# Patient Record
Sex: Female | Born: 1950 | Race: Asian | Hispanic: No | Marital: Married | Smoking: Never smoker
Health system: Southern US, Community
[De-identification: ages and names within clinical notes are randomized; demographics above are authoritative.]

## PROBLEM LIST (undated history)

## (undated) DIAGNOSIS — E785 Hyperlipidemia, unspecified: Secondary | ICD-10-CM

## (undated) DIAGNOSIS — E7800 Pure hypercholesterolemia, unspecified: Secondary | ICD-10-CM

## (undated) DIAGNOSIS — D649 Anemia, unspecified: Secondary | ICD-10-CM

## (undated) DIAGNOSIS — N10 Acute tubulo-interstitial nephritis: Secondary | ICD-10-CM

## (undated) DIAGNOSIS — N289 Disorder of kidney and ureter, unspecified: Secondary | ICD-10-CM

## (undated) DIAGNOSIS — N1330 Unspecified hydronephrosis: Secondary | ICD-10-CM

## (undated) DIAGNOSIS — Z8619 Personal history of other infectious and parasitic diseases: Secondary | ICD-10-CM

## (undated) DIAGNOSIS — E119 Type 2 diabetes mellitus without complications: Secondary | ICD-10-CM

## (undated) DIAGNOSIS — I10 Essential (primary) hypertension: Secondary | ICD-10-CM

## (undated) HISTORY — PX: NO PAST SURGERIES: SHX2092

---

## 2006-06-16 ENCOUNTER — Emergency Department (HOSPITAL_COMMUNITY): Admission: EM | Admit: 2006-06-16 | Discharge: 2006-06-16 | Payer: Self-pay | Admitting: Family Medicine

## 2015-12-15 DIAGNOSIS — E781 Pure hyperglyceridemia: Secondary | ICD-10-CM | POA: Diagnosis not present

## 2015-12-15 DIAGNOSIS — E1165 Type 2 diabetes mellitus with hyperglycemia: Secondary | ICD-10-CM | POA: Diagnosis not present

## 2016-05-03 DIAGNOSIS — E1165 Type 2 diabetes mellitus with hyperglycemia: Secondary | ICD-10-CM | POA: Diagnosis not present

## 2016-05-03 DIAGNOSIS — E781 Pure hyperglyceridemia: Secondary | ICD-10-CM | POA: Diagnosis not present

## 2016-05-19 DIAGNOSIS — E1165 Type 2 diabetes mellitus with hyperglycemia: Secondary | ICD-10-CM | POA: Diagnosis not present

## 2016-05-19 DIAGNOSIS — Z23 Encounter for immunization: Secondary | ICD-10-CM | POA: Diagnosis not present

## 2016-07-27 ENCOUNTER — Encounter (HOSPITAL_COMMUNITY): Payer: Self-pay | Admitting: Emergency Medicine

## 2016-07-27 ENCOUNTER — Ambulatory Visit (HOSPITAL_COMMUNITY)
Admission: EM | Admit: 2016-07-27 | Discharge: 2016-07-27 | Disposition: A | Discharge status: Home / Self Care | Payer: BLUE CROSS/BLUE SHIELD | Attending: Family Medicine | Admitting: Family Medicine

## 2016-07-27 ENCOUNTER — Ambulatory Visit (INDEPENDENT_AMBULATORY_CARE_PROVIDER_SITE_OTHER): Payer: BLUE CROSS/BLUE SHIELD

## 2016-07-27 DIAGNOSIS — R509 Fever, unspecified: Secondary | ICD-10-CM | POA: Diagnosis not present

## 2016-07-27 DIAGNOSIS — J111 Influenza due to unidentified influenza virus with other respiratory manifestations: Secondary | ICD-10-CM

## 2016-07-27 DIAGNOSIS — R69 Illness, unspecified: Secondary | ICD-10-CM

## 2016-07-27 MED ORDER — ONDANSETRON HCL 4 MG PO TABS: 4.0000 mg | ORAL_TABLET | Freq: Four times a day (QID) | ORAL | 0 refills | Status: DC

## 2016-07-27 NOTE — ED Triage Notes (Signed)
[facility] with her family with a complaint of a fever. The patient reported that she has had a fever x 3 days with general body aches and vomiting. The patient reported general body aches but denied abdominal pain. The patient denied any antipyretics today.

## 2016-07-27 NOTE — ED Provider Notes (Signed)
[facility]    CSN: [id number] Arrival date & time: 07/27/16  1327     History   Chief Complaint Chief Complaint  Patient presents with  . Fever    HPI Rachel Nelson is a 65 y.o. female.   The history is provided by the patient and a relative. The history is limited by a language barrier. Language interpreter used: son translated.  Fever  Temp source:  Tactile Severity:  Mild Onset quality:  Sudden Duration:  3 days Timing:  Constant Progression:  Unchanged Chronicity:  New (had flu vaccine 2mos ago.) Associated symptoms: vomiting   Associated symptoms: no diarrhea     Past Medical History:  Diagnosis Date  . Diabetes mellitus without complication (HCC)     There are no active problems to display for this patient.   History reviewed. No pertinent surgical history.  OB History    No data available       Home Medications    Prior to Admission medications   Medication Sig Start Date End Date Taking? Authorizing Provider  metFORMIN (GLUCOPHAGE) 500 MG tablet Take 500 mg by mouth 2 (two) times daily with a meal.   Yes Historical Provider, MD    Family History History reviewed. No pertinent family history.  Social History Social History  Substance Use Topics  . Smoking status: Never Smoker  . Smokeless tobacco: Never Used  . Alcohol use No     Allergies   Patient has no known allergies.   Review of Systems Review of Systems  Constitutional: Positive for fever.  HENT: Negative.   Respiratory: Negative.   Cardiovascular: Negative.   Gastrointestinal: Positive for vomiting. Negative for blood in stool and diarrhea.  Genitourinary: Negative.   Neurological: Negative.   All other systems reviewed and are negative.    Physical Exam Triage Vital Signs ED Triage Vitals  Enc Vitals Group     BP 07/27/16 1424 (!) 151/51     Pulse Rate 07/27/16 1424 105     Resp 07/27/16 1424 16     Temp 07/27/16 1424 100.1 F (37.8 C)     Temp  Source 07/27/16 1424 Oral     SpO2 07/27/16 1424 97 %     Weight --      Height --      Head Circumference --      Peak Flow --      Pain Score 07/27/16 1430 7     Pain Loc --      Pain Edu? --      Excl. in GC? --    No data found.   Updated Vital Signs BP (!) 151/51 (BP Location: Left Arm)   Pulse 105   Temp 100.1 F (37.8 C) (Oral)   Resp 16   SpO2 97%   Visual Acuity Right Eye Distance:   Left Eye Distance:   Bilateral Distance:    Right Eye Near:   Left Eye Near:    Bilateral Near:     Physical Exam  Constitutional: She is oriented to person, place, and time. She appears well-developed and well-nourished. No distress.  HENT:  Right Ear: External ear normal.  Left Ear: External ear normal.  Mouth/Throat: Oropharynx is clear and moist.  Eyes: Conjunctivae and EOM are normal. Pupils are equal, round, and reactive to light.  Neck: Normal range of motion. Neck supple.  Cardiovascular: Normal rate, regular rhythm, normal heart sounds and intact distal pulses.   Pulmonary/Chest: Effort normal and  breath sounds normal. She has no rales.  Abdominal: Soft. Bowel sounds are normal.  Lymphadenopathy:    She has no cervical adenopathy.  Neurological: She is alert and oriented to person, place, and time.  Skin: Skin is warm and dry.  Nursing note and vitals reviewed.    UC Treatments / Results  Labs (all labs ordered are listed, but only abnormal results are displayed) Labs Reviewed - No data to display  EKG  EKG Interpretation None       Radiology No results found. X-rays reviewed and report per radiologist.  Procedures Procedures (including critical care time)  Medications Ordered in UC Medications - No data to display   Initial Impression / Assessment and Plan / UC Course  I have reviewed the triage vital signs and the nursing notes.  Pertinent labs & imaging results that were available during my care of the patient were reviewed by me and  considered in my medical decision making (see chart for details).  Clinical Course       Final Clinical Impressions(s) / UC Diagnoses   Final diagnoses:  None    New Prescriptions New Prescriptions   No medications on file     Linna HoffJames D Kindl, MD 07/27/16 1626

## 2016-08-10 DIAGNOSIS — S46001A Unspecified injury of muscle(s) and tendon(s) of the rotator cuff of right shoulder, initial encounter: Secondary | ICD-10-CM | POA: Diagnosis not present

## 2017-03-13 DIAGNOSIS — Z87448 Personal history of other diseases of urinary system: Secondary | ICD-10-CM

## 2017-03-13 DIAGNOSIS — Z87440 Personal history of urinary (tract) infections: Secondary | ICD-10-CM

## 2017-03-13 HISTORY — DX: Personal history of urinary (tract) infections: Z87.440

## 2017-03-13 HISTORY — DX: Personal history of other diseases of urinary system: Z87.448

## 2017-03-14 DIAGNOSIS — R5381 Other malaise: Secondary | ICD-10-CM | POA: Diagnosis not present

## 2017-03-16 DIAGNOSIS — R739 Hyperglycemia, unspecified: Secondary | ICD-10-CM | POA: Diagnosis not present

## 2017-03-16 DIAGNOSIS — M5489 Other dorsalgia: Secondary | ICD-10-CM | POA: Diagnosis not present

## 2017-03-21 ENCOUNTER — Encounter (HOSPITAL_COMMUNITY): Payer: Self-pay | Admitting: Emergency Medicine

## 2017-03-21 ENCOUNTER — Emergency Department (HOSPITAL_COMMUNITY): Payer: BLUE CROSS/BLUE SHIELD

## 2017-03-21 ENCOUNTER — Inpatient Hospital Stay (HOSPITAL_COMMUNITY)
Admission: EM | Admit: 2017-03-21 | Discharge: 2017-03-24 | DRG: 690 | Disposition: A | Discharge status: Home / Self Care | Payer: BLUE CROSS/BLUE SHIELD | Attending: Family Medicine | Admitting: Family Medicine

## 2017-03-21 DIAGNOSIS — R109 Unspecified abdominal pain: Secondary | ICD-10-CM | POA: Diagnosis not present

## 2017-03-21 DIAGNOSIS — R636 Underweight: Secondary | ICD-10-CM | POA: Diagnosis not present

## 2017-03-21 DIAGNOSIS — Z7984 Long term (current) use of oral hypoglycemic drugs: Secondary | ICD-10-CM

## 2017-03-21 DIAGNOSIS — Z1612 Extended spectrum beta lactamase (ESBL) resistance: Secondary | ICD-10-CM | POA: Diagnosis not present

## 2017-03-21 DIAGNOSIS — E119 Type 2 diabetes mellitus without complications: Secondary | ICD-10-CM

## 2017-03-21 DIAGNOSIS — N12 Tubulo-interstitial nephritis, not specified as acute or chronic: Secondary | ICD-10-CM

## 2017-03-21 DIAGNOSIS — E785 Hyperlipidemia, unspecified: Secondary | ICD-10-CM | POA: Diagnosis not present

## 2017-03-21 DIAGNOSIS — E871 Hypo-osmolality and hyponatremia: Secondary | ICD-10-CM | POA: Diagnosis not present

## 2017-03-21 DIAGNOSIS — N10 Acute tubulo-interstitial nephritis: Secondary | ICD-10-CM | POA: Diagnosis not present

## 2017-03-21 DIAGNOSIS — D638 Anemia in other chronic diseases classified elsewhere: Secondary | ICD-10-CM | POA: Diagnosis not present

## 2017-03-21 DIAGNOSIS — N289 Disorder of kidney and ureter, unspecified: Secondary | ICD-10-CM | POA: Diagnosis not present

## 2017-03-21 DIAGNOSIS — E1165 Type 2 diabetes mellitus with hyperglycemia: Secondary | ICD-10-CM | POA: Diagnosis not present

## 2017-03-21 DIAGNOSIS — E861 Hypovolemia: Secondary | ICD-10-CM | POA: Diagnosis present

## 2017-03-21 DIAGNOSIS — D649 Anemia, unspecified: Secondary | ICD-10-CM | POA: Diagnosis not present

## 2017-03-21 DIAGNOSIS — Z681 Body mass index (BMI) 19 or less, adult: Secondary | ICD-10-CM

## 2017-03-21 DIAGNOSIS — B9620 Unspecified Escherichia coli [E. coli] as the cause of diseases classified elsewhere: Secondary | ICD-10-CM | POA: Diagnosis not present

## 2017-03-21 DIAGNOSIS — N132 Hydronephrosis with renal and ureteral calculous obstruction: Secondary | ICD-10-CM | POA: Diagnosis not present

## 2017-03-21 DIAGNOSIS — R1032 Left lower quadrant pain: Secondary | ICD-10-CM | POA: Diagnosis not present

## 2017-03-21 HISTORY — DX: Acute pyelonephritis: N10

## 2017-03-21 HISTORY — DX: Pure hypercholesterolemia, unspecified: E78.00

## 2017-03-21 HISTORY — DX: Disorder of kidney and ureter, unspecified: N28.9

## 2017-03-21 HISTORY — DX: Type 2 diabetes mellitus without complications: E11.9

## 2017-03-21 LAB — DIFFERENTIAL
BASOS ABS: 0 10*3/uL (ref 0.0–0.1)
BASOS PCT: 0 %
EOS PCT: 1 %
Eosinophils Absolute: 0.2 10*3/uL (ref 0.0–0.7)
LYMPHS PCT: 8 %
Lymphs Abs: 1.9 10*3/uL (ref 0.7–4.0)
Monocytes Absolute: 1.9 10*3/uL — ABNORMAL HIGH (ref 0.1–1.0)
Monocytes Relative: 8 %
NEUTROS ABS: 18.7 10*3/uL — AB (ref 1.7–7.7)
NEUTROS PCT: 83 %

## 2017-03-21 LAB — LIPASE, BLOOD: Lipase: 38 U/L (ref 11–51)

## 2017-03-21 LAB — URINALYSIS, ROUTINE W REFLEX MICROSCOPIC
Bilirubin Urine: NEGATIVE
GLUCOSE, UA: 150 mg/dL — AB
Ketones, ur: 5 mg/dL — AB
NITRITE: NEGATIVE
PH: 5 (ref 5.0–8.0)
PROTEIN: 30 mg/dL — AB
Specific Gravity, Urine: 1.014 (ref 1.005–1.030)

## 2017-03-21 LAB — CBC
HCT: 27 % — ABNORMAL LOW (ref 36.0–46.0)
Hemoglobin: 9.2 g/dL — ABNORMAL LOW (ref 12.0–15.0)
MCH: 28 pg (ref 26.0–34.0)
MCHC: 34.1 g/dL (ref 30.0–36.0)
MCV: 82.1 fL (ref 78.0–100.0)
Platelets: 386 10*3/uL (ref 150–400)
RBC: 3.29 MIL/uL — ABNORMAL LOW (ref 3.87–5.11)
RDW: 13.7 % (ref 11.5–15.5)
WBC: 23 10*3/uL — ABNORMAL HIGH (ref 4.0–10.5)

## 2017-03-21 LAB — COMPREHENSIVE METABOLIC PANEL
ALBUMIN: 2.1 g/dL — AB (ref 3.5–5.0)
ALK PHOS: 120 U/L (ref 38–126)
ALT: 37 U/L (ref 14–54)
ANION GAP: 9 (ref 5–15)
AST: 27 U/L (ref 15–41)
BILIRUBIN TOTAL: 0.8 mg/dL (ref 0.3–1.2)
BUN: 22 mg/dL — AB (ref 6–20)
CALCIUM: 8.2 mg/dL — AB (ref 8.9–10.3)
CO2: 22 mmol/L (ref 22–32)
Chloride: 94 mmol/L — ABNORMAL LOW (ref 101–111)
Creatinine, Ser: 1.34 mg/dL — ABNORMAL HIGH (ref 0.44–1.00)
GFR calc Af Amer: 47 mL/min — ABNORMAL LOW (ref >60)
GFR calc non Af Amer: 41 mL/min — ABNORMAL LOW (ref >60)
GLUCOSE: 258 mg/dL — AB (ref 65–99)
Potassium: 4.7 mmol/L (ref 3.5–5.1)
Sodium: 125 mmol/L — ABNORMAL LOW (ref 135–145)
TOTAL PROTEIN: 7.5 g/dL (ref 6.5–8.1)

## 2017-03-21 LAB — CBG MONITORING, ED
GLUCOSE-CAPILLARY: 248 mg/dL — AB (ref 65–99)
Glucose-Capillary: 252 mg/dL — ABNORMAL HIGH (ref 65–99)

## 2017-03-21 MED ORDER — ACETAMINOPHEN 325 MG PO TABS
650.0000 mg | ORAL_TABLET | Freq: Four times a day (QID) | ORAL | Status: DC | PRN
  Administered 2017-03-23: 650 mg via ORAL
  Filled 2017-03-21: qty 2

## 2017-03-21 MED ORDER — DEXTROSE 5 % IV SOLN
1.0000 g | INTRAVENOUS | Status: DC
  Administered 2017-03-22: 1 g via INTRAVENOUS
  Filled 2017-03-21 (×2): qty 10

## 2017-03-21 MED ORDER — INSULIN ASPART 100 UNIT/ML ~~LOC~~ SOLN
0.0000 [IU] | Freq: Three times a day (TID) | SUBCUTANEOUS | Status: DC
  Administered 2017-03-22: 3 [IU] via SUBCUTANEOUS
  Administered 2017-03-22: 7 [IU] via SUBCUTANEOUS

## 2017-03-21 MED ORDER — HYDROCODONE-ACETAMINOPHEN 5-325 MG PO TABS
1.0000 | ORAL_TABLET | ORAL | Status: DC | PRN
  Administered 2017-03-22: 2 via ORAL
  Filled 2017-03-21: qty 2

## 2017-03-21 MED ORDER — DEXTROSE 5 % IV SOLN
1.0000 g | Freq: Once | INTRAVENOUS | Status: AC
  Administered 2017-03-21: 1 g via INTRAVENOUS
  Filled 2017-03-21: qty 10

## 2017-03-21 MED ORDER — GEMFIBROZIL 600 MG PO TABS
600.0000 mg | ORAL_TABLET | Freq: Two times a day (BID) | ORAL | Status: DC
  Administered 2017-03-22 – 2017-03-24 (×5): 600 mg via ORAL
  Filled 2017-03-21 (×6): qty 1

## 2017-03-21 MED ORDER — INSULIN ASPART 100 UNIT/ML ~~LOC~~ SOLN
0.0000 [IU] | Freq: Every day | SUBCUTANEOUS | Status: DC
  Administered 2017-03-21: 2 [IU] via SUBCUTANEOUS

## 2017-03-21 MED ORDER — SODIUM CHLORIDE 0.9 % IV BOLUS (SEPSIS)
500.0000 mL | Freq: Once | INTRAVENOUS | Status: AC
  Administered 2017-03-22: 500 mL via INTRAVENOUS

## 2017-03-21 MED ORDER — ONDANSETRON HCL 4 MG PO TABS: 4.0000 mg | ORAL_TABLET | Freq: Four times a day (QID) | ORAL | Status: DC | PRN

## 2017-03-21 MED ORDER — MORPHINE SULFATE (PF) 4 MG/ML IV SOLN
4.0000 mg | Freq: Once | INTRAVENOUS | Status: AC
  Administered 2017-03-21: 4 mg via INTRAVENOUS
  Filled 2017-03-21: qty 1

## 2017-03-21 MED ORDER — ENOXAPARIN SODIUM 40 MG/0.4ML ~~LOC~~ SOLN
40.0000 mg | SUBCUTANEOUS | Status: DC
  Administered 2017-03-22 – 2017-03-24 (×3): 40 mg via SUBCUTANEOUS
  Filled 2017-03-21 (×3): qty 0.4

## 2017-03-21 MED ORDER — MORPHINE SULFATE (PF) 4 MG/ML IV SOLN
2.0000 mg | INTRAVENOUS | Status: DC | PRN
  Administered 2017-03-23: 2 mg via INTRAVENOUS
  Filled 2017-03-21: qty 1

## 2017-03-21 MED ORDER — SENNOSIDES-DOCUSATE SODIUM 8.6-50 MG PO TABS: 1.0000 | ORAL_TABLET | Freq: Every evening | ORAL | Status: DC | PRN

## 2017-03-21 MED ORDER — SODIUM CHLORIDE 0.9 % IV SOLN
INTRAVENOUS | Status: DC
  Administered 2017-03-21 – 2017-03-22 (×2): via INTRAVENOUS

## 2017-03-21 MED ORDER — BISACODYL 5 MG PO TBEC: 5.0000 mg | DELAYED_RELEASE_TABLET | Freq: Every day | ORAL | Status: DC | PRN

## 2017-03-21 MED ORDER — ACETAMINOPHEN 650 MG RE SUPP: 650.0000 mg | Freq: Four times a day (QID) | RECTAL | Status: DC | PRN

## 2017-03-21 MED ORDER — ONDANSETRON HCL 4 MG/2ML IJ SOLN: 4.0000 mg | Freq: Four times a day (QID) | INTRAMUSCULAR | Status: DC | PRN

## 2017-03-21 MED ORDER — SODIUM CHLORIDE 0.9% FLUSH
3.0000 mL | Freq: Two times a day (BID) | INTRAVENOUS | Status: DC
  Administered 2017-03-21 – 2017-03-23 (×3): 3 mL via INTRAVENOUS

## 2017-03-21 NOTE — ED Provider Notes (Signed)
[facility]   CSN: [id number] Arrival date & time: 03/21/17  4098     History   Chief Complaint Chief Complaint  Patient presents with  . Abdominal Pain  . Hypoglycemia    HPI Rachel Nelson is a 66 y.o. female.  HPI Patient presents to the emergency department with weakness, abdominal discomfort, decreased appetite and fluctuating blood sugars.  The patient said several episodes of vomiting as well  The patient states that symptoms started about a week ago.  Patient states nothing seems make the condition better or worse.  The son is translating for the patient.  He states that she has not had any known fevers but has felt chills.  Patient states she has not been eating very well in the last few days.  Son states that she been complaining of some back pain as well. The patient denies chest pain, shortness of breath, headache,blurred vision, neck pain, fever, cough, numbness, dizziness, anorexia, edema,  diarrhea, rash, back pain, dysuria, hematemesis, bloody stool, near syncope, or syncope. Past Medical History:  Diagnosis Date  . Diabetes mellitus without complication (HCC)     There are no active problems to display for this patient.   History reviewed. No pertinent surgical history.  OB History    No data available       Home Medications    Prior to Admission medications   Medication Sig Start Date End Date Taking? Authorizing Provider  metFORMIN (GLUCOPHAGE) 500 MG tablet Take 500 mg by mouth 2 (two) times daily with a meal.    [provider]  ondansetron (ZOFRAN) 4 MG tablet Take 1 tablet (4 mg total) by mouth every 6 (six) hours. Prn n/v. 07/27/16   Linna Hoff, MD    Family History No family history on file.  Social History Social History  Substance Use Topics  . Smoking status: Never Smoker  . Smokeless tobacco: Never Used  . Alcohol use No     Allergies   Patient has no known allergies.   Review of Systems Review  of Systems All other systems negative except as documented in the HPI. All pertinent positives and negatives as reviewed in the HPI.  Physical Exam Updated Vital Signs BP (!) 121/59   Pulse 79   Temp 98.7 F (37.1 C) (Oral)   Resp 18   Ht 5\' 5"  (1.651 m)   Wt 54 kg (119 lb 0.8 oz)   SpO2 98%   BMI 19.81 kg/m   Physical Exam  Constitutional: She is oriented to person, place, and time. She appears well-developed and well-nourished. No distress.  HENT:  Head: Normocephalic and atraumatic.  Mouth/Throat: Oropharynx is clear and moist.  Eyes: Pupils are equal, round, and reactive to light.  Neck: Normal range of motion. Neck supple.  Cardiovascular: Normal rate, regular rhythm and normal heart sounds.  Exam reveals no gallop and no friction rub.   No murmur heard. Pulmonary/Chest: Effort normal and breath sounds normal. No respiratory distress. She has no wheezes.  Abdominal: Soft. Bowel sounds are normal. She exhibits no distension and no mass. There is tenderness. There is no guarding.  Musculoskeletal: She exhibits no edema.  Neurological: She is alert and oriented to person, place, and time. She exhibits normal muscle tone. Coordination normal.  Skin: Skin is warm and dry. Capillary refill takes less than 2 seconds. No rash noted. No erythema.  Psychiatric: She has a normal mood and affect. Her behavior is normal.  Nursing note  and vitals reviewed.    ED Treatments / Results  Labs (all labs ordered are listed, but only abnormal results are displayed) Labs Reviewed  COMPREHENSIVE METABOLIC PANEL - Abnormal; Notable for the following:       Result Value   Sodium 125 (*)    Chloride 94 (*)    Glucose, Bld 258 (*)    BUN 22 (*)    Creatinine, Ser 1.34 (*)    Calcium 8.2 (*)    Albumin 2.1 (*)    GFR calc non Af Amer 41 (*)    GFR calc Af Amer 47 (*)    All other components within normal limits  CBC - Abnormal; Notable for the following:    WBC 23.0 (*)    RBC 3.29 (*)     Hemoglobin 9.2 (*)    HCT 27.0 (*)    All other components within normal limits  URINALYSIS, ROUTINE W REFLEX MICROSCOPIC - Abnormal; Notable for the following:    APPearance CLOUDY (*)    Glucose, UA 150 (*)    Hgb urine dipstick MODERATE (*)    Ketones, ur 5 (*)    Protein, ur 30 (*)    Leukocytes, UA LARGE (*)    Bacteria, UA MANY (*)    Squamous Epithelial / LPF 6-30 (*)    Non Squamous Epithelial 0-5 (*)    All other components within normal limits  DIFFERENTIAL - Abnormal; Notable for the following:    Neutro Abs 18.7 (*)    Monocytes Absolute 1.9 (*)    All other components within normal limits  CBG MONITORING, ED - Abnormal; Notable for the following:    Glucose-Capillary 252 (*)    All other components within normal limits  URINE CULTURE  LIPASE, BLOOD    EKG  EKG Interpretation None       Radiology Ct Renal Stone Study  Result Date: 03/21/2017 CLINICAL DATA:  Left flank pain for 1 week EXAM: CT ABDOMEN AND PELVIS WITHOUT CONTRAST TECHNIQUE: Multidetector CT imaging of the abdomen and pelvis was performed following the standard protocol without IV contrast. COMPARISON:  None. FINDINGS: Lower chest: There is atelectasis of the bilateral posterior lung bases. The heart size is normal. Hepatobiliary: No focal liver abnormality is seen. No gallstones, gallbladder wall thickening, or biliary dilatation. Pancreas: Unremarkable. No pancreatic ductal dilatation or surrounding inflammatory changes. Spleen: Normal in size without focal abnormality. Adrenals/Urinary Tract: There is mild hydronephrosis upper pole of left kidney. There is left perinephric stranding. There are no stones identified in both kidneys or in the course of the bilateral ureters. The bladder is fluid filled without gross abnormality. Stomach/Bowel: Stomach is within normal limits. Appendix appears normal. No evidence of bowel wall thickening, distention, or inflammatory changes. Vascular/Lymphatic: Aortic  atherosclerosis. No enlarged abdominal or pelvic lymph nodes. Reproductive: The uterus is normal. There is a 2 mm calcification in the left ovary. Other: There is no ascites or hernia identified. Musculoskeletal: Degenerative joint changes of the spine are noted. IMPRESSION: Mild hydronephrosis noted in the upper pole of left kidney an left perinephric stranding without focal stone identified in either kidney or collecting systems. The findings can be due to recently passed stone or pyelonephritis. Electronically Signed   By: Sherian ReinWei-Chen  Lin M.D.   On: 03/21/2017 16:33    Procedures Procedures (including critical care time)  Medications Ordered in ED Medications  morphine 4 MG/ML injection 4 mg (4 mg Intravenous Given 03/21/17 1536)  cefTRIAXone (ROCEPHIN) 1 g in  dextrose 5 % 50 mL IVPB (0 g Intravenous Stopped 03/21/17 1612)     Initial Impression / Assessment and Plan / ED Course  I have reviewed the triage vital signs and the nursing notes.  Pertinent labs & imaging results that were available during my care of the patient were reviewed by me and considered in my medical decision making (see chart for details).     The patient will need admission to the hospital for pyelonephritis along with her diabetes.  The patient has been receiving IV antibiotics here in the emergency room.  She also need dextrose for her low blood sugar.  Patient and family are made aware the plan and all questions were answered  Final Clinical Impressions(s) / ED Diagnoses   Final diagnoses:  Pyelonephritis    New Prescriptions New Prescriptions   No medications on file     Kyra Manges 03/21/17 Earl Many, MD 03/21/17 [phone]

## 2017-03-21 NOTE — ED Triage Notes (Signed)
Son/translator stated, shes had stomach pain and not eating.  Her blood sugar is low. This started a week ago.

## 2017-03-21 NOTE — H&P (Signed)
History and Physical    Rachel Nelson ZOX:[id number] DOB: November 05, 1950 DOA: 03/21/2017  PCP: Patient, No Pcp Per   Patient coming from: Home  Chief Complaint: Left flank pain, loss of appetite, gen weakness and malaise   HPI: Dawnmarie Hennen is a 66 y.o. female with medical history significant for non-insulin-dependent type 2 diabetes mellitus, now presenting to the emergency department with 1 week of progressive left flank pain, loss of appetite, lethargy, and malaise. She reports being in her usual state of good health until one week ago when she noted the insidious development of pain at the left flank. She describes it as severe, localized, constant, worse with movement, sharp in character, and with no alleviating factors identified. She denies any significant abdominal pain, but notes mild nausea without vomiting. She denies fevers or chills. She is usually very active and has a good appetite, but has felt increasingly fatigued over the past week and with no desire for food. No recent long distance travel. No history of MDR organisms. No recent antibiotics or hospitalization.   ED Course: Upon arrival to the ED, patient is found to be afebrile, saturating well on room air, and with vital signs stable. Chemistry panel reveals a sodium of 125, glucose 258, albumin 2.1, and creatinine 1.34 with no prior available for comparison. CBC features a leukocytosis to 23,000 with mild left shift, normocytic anemia with hemoglobin of 9.2, and with no prior CBCs available. Urinalysis is highly suggestive of infection and there is moderate hemoglobin on the dipstick. Urine was sent for culture and the patient was treated with morphine and empiric Rocephin.she remained hemodynamically stable in the ED and in no apparent respiratory distress and will be admitted to medical surgical unit for ongoing evaluation and management of acute left-sided pyelonephritis.  Review of Systems:  All other systems reviewed and apart  from HPI, are negative.  Past Medical History:  Diagnosis Date  . Diabetes mellitus without complication (HCC)     History reviewed. No pertinent surgical history.   reports that she has never smoked. She has never used smokeless tobacco. She reports that she does not drink alcohol. Her drug history is not on file.  No Known Allergies  History reviewed. No pertinent family history.   Prior to Admission medications   Medication Sig Start Date End Date Taking? Authorizing Provider  acetaminophen (TYLENOL) 325 MG tablet Take 325 mg by mouth every 6 (six) hours as needed for mild pain or headache.   Yes [provider]  gemfibrozil (LOPID) 600 MG tablet Take 600 mg by mouth 2 (two) times daily.   Yes [provider]  metFORMIN (GLUCOPHAGE-XR) 500 MG 24 hr tablet Take 500 mg by mouth 2 (two) times daily with a meal.   Yes [provider]  ondansetron (ZOFRAN) 4 MG tablet Take 1 tablet (4 mg total) by mouth every 6 (six) hours. Prn n/v. Patient not taking: Reported on 03/21/2017 07/27/16   Linna Hoff, MD    Physical Exam: Vitals:   03/21/17 1545 03/21/17 1600 03/21/17 1906 03/21/17 2015  BP: 116/63 (!) 121/59 (!) 133/58 123/60  Pulse: 76 79 81 83  Resp:  18 16   Temp:      TempSrc:      SpO2: 97% 98% 100% 92%  Weight:      Height:          Constitutional: NAD, calm, in apparent discomfort.  Eyes: PERTLA, lids and conjunctivae normal ENMT: Mucous membranes are moist. Posterior  pharynx clear of any exudate or lesions.   Neck: normal, supple, no masses, no thyromegaly Respiratory: clear to auscultation bilaterally, no wheezing, no crackles. Normal respiratory effort.    Cardiovascular: S1 & S2 heard, regular rate and rhythm. No extremity edema. No significant JVD. Abdomen: No distension, no tenderness, no masses palpated. Bowel sounds normal. Left CVA tenderness.  Musculoskeletal: no clubbing / cyanosis. No joint deformity upper and lower  extremities.   Skin: no significant rashes, lesions, ulcers. Warm, dry, well-perfused. Neurologic: CN 2-12 grossly intact. Sensation intact, DTR normal. Strength 5/5 in all 4 limbs.  Psychiatric: Alert and oriented x 3. Pleasant and cooperative.     Labs on Admission: I have personally reviewed following labs and imaging studies  CBC:  Recent Labs Lab 03/21/17 1003  WBC 23.0*  NEUTROABS 18.7*  HGB 9.2*  HCT 27.0*  MCV 82.1  PLT 386   Basic Metabolic Panel:  Recent Labs Lab 03/21/17 1003  NA 125*  K 4.7  CL 94*  CO2 22  GLUCOSE 258*  BUN 22*  CREATININE 1.34*  CALCIUM 8.2*   GFR: Estimated Creatinine Clearance: 35.7 mL/min (A) (by C-G formula based on SCr of 1.34 mg/dL (H)). Liver Function Tests:  Recent Labs Lab 03/21/17 1003  AST 27  ALT 37  ALKPHOS 120  BILITOT 0.8  PROT 7.5  ALBUMIN 2.1*    Recent Labs Lab 03/21/17 1003  LIPASE 38   No results for input(s): AMMONIA in the last 168 hours. Coagulation Profile: No results for input(s): INR, PROTIME in the last 168 hours. Cardiac Enzymes: No results for input(s): CKTOTAL, CKMB, CKMBINDEX, TROPONINI in the last 168 hours. BNP (last 3 results) No results for input(s): PROBNP in the last 8760 hours. HbA1C: No results for input(s): HGBA1C in the last 72 hours. CBG:  Recent Labs Lab 03/21/17 1003  GLUCAP 252*   Lipid Profile: No results for input(s): CHOL, HDL, LDLCALC, TRIG, CHOLHDL, LDLDIRECT in the last 72 hours. Thyroid Function Tests: No results for input(s): TSH, T4TOTAL, FREET4, T3FREE, THYROIDAB in the last 72 hours. Anemia Panel: No results for input(s): VITAMINB12, FOLATE, FERRITIN, TIBC, IRON, RETICCTPCT in the last 72 hours. Urine analysis:    Component Value Date/Time   COLORURINE YELLOW 03/21/2017 1007   APPEARANCEUR CLOUDY (A) 03/21/2017 1007   LABSPEC 1.014 03/21/2017 1007   PHURINE 5.0 03/21/2017 1007   GLUCOSEU 150 (A) 03/21/2017 1007   HGBUR MODERATE (A) 03/21/2017 1007    BILIRUBINUR NEGATIVE 03/21/2017 1007   KETONESUR 5 (A) 03/21/2017 1007   PROTEINUR 30 (A) 03/21/2017 1007   NITRITE NEGATIVE 03/21/2017 1007   LEUKOCYTESUR LARGE (A) 03/21/2017 1007   Sepsis Labs: @LABRCNTIP (procalcitonin:4,lacticidven:4) )No results found for this or any previous visit (from the past 240 hour(s)).   Radiological Exams on Admission: Ct Renal Stone Study  Result Date: 03/21/2017 CLINICAL DATA:  Left flank pain for 1 week EXAM: CT ABDOMEN AND PELVIS WITHOUT CONTRAST TECHNIQUE: Multidetector CT imaging of the abdomen and pelvis was performed following the standard protocol without IV contrast. COMPARISON:  None. FINDINGS: Lower chest: There is atelectasis of the bilateral posterior lung bases. The heart size is normal. Hepatobiliary: No focal liver abnormality is seen. No gallstones, gallbladder wall thickening, or biliary dilatation. Pancreas: Unremarkable. No pancreatic ductal dilatation or surrounding inflammatory changes. Spleen: Normal in size without focal abnormality. Adrenals/Urinary Tract: There is mild hydronephrosis upper pole of left kidney. There is left perinephric stranding. There are no stones identified in both kidneys or in the course  of the bilateral ureters. The bladder is fluid filled without gross abnormality. Stomach/Bowel: Stomach is within normal limits. Appendix appears normal. No evidence of bowel wall thickening, distention, or inflammatory changes. Vascular/Lymphatic: Aortic atherosclerosis. No enlarged abdominal or pelvic lymph nodes. Reproductive: The uterus is normal. There is a 2 mm calcification in the left ovary. Other: There is no ascites or hernia identified. Musculoskeletal: Degenerative joint changes of the spine are noted. IMPRESSION: Mild hydronephrosis noted in the upper pole of left kidney an left perinephric stranding without focal stone identified in either kidney or collecting systems. The findings can be due to recently passed stone or  pyelonephritis. Electronically Signed   By: Sherian Rein M.D.   On: 03/21/2017 16:33    EKG: Not performed.   Assessment/Plan  1. Acute pyelonephritis, left  - Pt presents with 1 wk of left flank pain, anorexia, and malaise  - Found to have marked leukocytosis, UA suggestive on infection, and CT-evidence for left-sided pyelonephritis  - No recent travel outside country, abx, hospitalization, instrumentation, or hx of MDR organisms   - Urine was sent for culture from ED and empiric Rocephin was started  - Plan to continue Rocephin pending culture data - Supportive care with IVF, prn analgesia, and prn antiemetics   2. Renal insufficiency - SCr is 1.34 on admission with no prior for comparison  - Given the acute illness with 1 wk of anorexia, this is likely an acute prerenal azotemia  - CT abd/pelvis with mild hydro in upper pole left kidney, but this is likely secondary to pyelonephritis in this clinical setting and no obstructing stone or lesion is identified  - Plan to hydrate with IVF, check urine studies, and repeat chem panel in am-   3. Hyponatremia  - Serum sodium is 125 on admission in setting of 1 wk anorexia and clinical hypovolemia  - She will be hydrated overnight with IVF and chem panel will be repeated in am    4. Normocytic anemia - Hgb is 9.2 and normocytic on admission  - There is no prior CBC available - No bleeding is evident and there is no pallor or tachycardia  - Will check anemia panel    5. Type II DM  - No A1c on file  - Treated with metformin only at home  - Serum glucose 258 on admission  - Hold metformin, check CBG's with meals and qHS, and start a low-intensity Novolog correctional     DVT prophylaxis: sq Lovenox Code Status: Full  Family Communication: Son and daughter updated at bedside Disposition Plan: Admit to med-surg Consults called: None Admission status: Inpatient    Briscoe Deutscher, MD [facility] Pager [phone]  If  7PM-7AM, please contact night-coverage [email or web address] Password [email or web address]  03/21/2017, 8:56 PM

## 2017-03-22 ENCOUNTER — Encounter (HOSPITAL_COMMUNITY): Payer: Self-pay | Admitting: General Practice

## 2017-03-22 DIAGNOSIS — N12 Tubulo-interstitial nephritis, not specified as acute or chronic: Secondary | ICD-10-CM

## 2017-03-22 LAB — BASIC METABOLIC PANEL
Anion gap: 7 (ref 5–15)
BUN: 17 mg/dL (ref 6–20)
CALCIUM: 7.5 mg/dL — AB (ref 8.9–10.3)
CO2: 19 mmol/L — ABNORMAL LOW (ref 22–32)
CREATININE: 1.19 mg/dL — AB (ref 0.44–1.00)
Chloride: 98 mmol/L — ABNORMAL LOW (ref 101–111)
GFR calc non Af Amer: 47 mL/min — ABNORMAL LOW (ref >60)
GFR, EST AFRICAN AMERICAN: 54 mL/min — AB (ref >60)
Glucose, Bld: 260 mg/dL — ABNORMAL HIGH (ref 65–99)
Potassium: 4.5 mmol/L (ref 3.5–5.1)
SODIUM: 124 mmol/L — AB (ref 135–145)

## 2017-03-22 LAB — CBC WITH DIFFERENTIAL/PLATELET
BAND NEUTROPHILS: 5 %
BASOS PCT: 0 %
Basophils Absolute: 0 10*3/uL (ref 0.0–0.1)
Blasts: 0 %
EOS ABS: 0.6 10*3/uL (ref 0.0–0.7)
Eosinophils Relative: 3 %
HCT: 24 % — ABNORMAL LOW (ref 36.0–46.0)
Hemoglobin: 8 g/dL — ABNORMAL LOW (ref 12.0–15.0)
LYMPHS PCT: 7 %
Lymphs Abs: 1.4 10*3/uL (ref 0.7–4.0)
MCH: 28 pg (ref 26.0–34.0)
MCHC: 33.3 g/dL (ref 30.0–36.0)
MCV: 83.9 fL (ref 78.0–100.0)
MONO ABS: 1.6 10*3/uL — AB (ref 0.1–1.0)
MONOS PCT: 8 %
Metamyelocytes Relative: 0 %
Myelocytes: 0 %
NEUTROS ABS: 16.6 10*3/uL — AB (ref 1.7–7.7)
Neutrophils Relative %: 77 %
OTHER: 0 %
PLATELETS: 360 10*3/uL (ref 150–400)
Promyelocytes Absolute: 0 %
RBC: 2.86 MIL/uL — ABNORMAL LOW (ref 3.87–5.11)
RDW: 14.1 % (ref 11.5–15.5)
WBC: 20.2 10*3/uL — ABNORMAL HIGH (ref 4.0–10.5)
nRBC: 0 /100 WBC

## 2017-03-22 LAB — GLUCOSE, CAPILLARY
GLUCOSE-CAPILLARY: 152 mg/dL — AB (ref 65–99)
GLUCOSE-CAPILLARY: 172 mg/dL — AB (ref 65–99)
GLUCOSE-CAPILLARY: 211 mg/dL — AB (ref 65–99)
GLUCOSE-CAPILLARY: 306 mg/dL — AB (ref 65–99)
Glucose-Capillary: 237 mg/dL — ABNORMAL HIGH (ref 65–99)

## 2017-03-22 LAB — IRON AND TIBC
IRON: 14 ug/dL — AB (ref 28–170)
Saturation Ratios: 8 % — ABNORMAL LOW (ref 10.4–31.8)
TIBC: 167 ug/dL — ABNORMAL LOW (ref 250–450)
UIBC: 153 ug/dL

## 2017-03-22 LAB — SODIUM, URINE, RANDOM: Sodium, Ur: 26 mmol/L

## 2017-03-22 LAB — VITAMIN B12: VITAMIN B 12: 734 pg/mL (ref 180–914)

## 2017-03-22 LAB — CREATININE, URINE, RANDOM: Creatinine, Urine: 85.87 mg/dL

## 2017-03-22 LAB — HIV ANTIBODY (ROUTINE TESTING W REFLEX): HIV Screen 4th Generation wRfx: NONREACTIVE

## 2017-03-22 LAB — RETICULOCYTES
RBC.: 2.86 MIL/uL — ABNORMAL LOW (ref 3.87–5.11)
RETIC COUNT ABSOLUTE: 20 10*3/uL (ref 19.0–186.0)
Retic Ct Pct: 0.7 % (ref 0.4–3.1)

## 2017-03-22 LAB — TSH: TSH: 1.114 u[IU]/mL (ref 0.350–4.500)

## 2017-03-22 LAB — FERRITIN: FERRITIN: 305 ng/mL (ref 11–307)

## 2017-03-22 LAB — FOLATE: FOLATE: 12.6 ng/mL (ref >5.9)

## 2017-03-22 LAB — OSMOLALITY: Osmolality: 273 mOsm/kg — ABNORMAL LOW (ref 275–295)

## 2017-03-22 LAB — OSMOLALITY, URINE: OSMOLALITY UR: 428 mosm/kg (ref 300–900)

## 2017-03-22 MED ORDER — INSULIN ASPART 100 UNIT/ML ~~LOC~~ SOLN
0.0000 [IU] | Freq: Every day | SUBCUTANEOUS | Status: DC
  Administered 2017-03-23: 4 [IU] via SUBCUTANEOUS

## 2017-03-22 MED ORDER — INSULIN ASPART 100 UNIT/ML ~~LOC~~ SOLN
4.0000 [IU] | Freq: Three times a day (TID) | SUBCUTANEOUS | Status: DC
  Administered 2017-03-22 – 2017-03-24 (×5): 4 [IU] via SUBCUTANEOUS

## 2017-03-22 MED ORDER — PNEUMOCOCCAL VAC POLYVALENT 25 MCG/0.5ML IJ INJ
0.5000 mL | INJECTION | INTRAMUSCULAR | Status: AC
  Administered 2017-03-24: 0.5 mL via INTRAMUSCULAR
  Filled 2017-03-22: qty 0.5

## 2017-03-22 MED ORDER — INSULIN ASPART 100 UNIT/ML ~~LOC~~ SOLN
0.0000 [IU] | Freq: Three times a day (TID) | SUBCUTANEOUS | Status: DC
  Administered 2017-03-22: 3 [IU] via SUBCUTANEOUS
  Administered 2017-03-23: 5 [IU] via SUBCUTANEOUS
  Administered 2017-03-23 (×2): 8 [IU] via SUBCUTANEOUS
  Administered 2017-03-24: 5 [IU] via SUBCUTANEOUS

## 2017-03-22 MED ORDER — ENSURE ENLIVE PO LIQD
237.0000 mL | Freq: Two times a day (BID) | ORAL | Status: DC
  Administered 2017-03-22 – 2017-03-24 (×4): 237 mL via ORAL

## 2017-03-22 NOTE — Progress Notes (Signed)
Inpatient Diabetes Program Recommendations  AACE/ADA: New Consensus Statement on Inpatient Glycemic Control (2015)  Target Ranges:  Prepandial:   less than 140 mg/dL      Peak postprandial:   less than 180 mg/dL (1-2 hours)      Critically ill patients:  140 - 180 mg/dL   Lab Results  Component Value Date   GLUCAP 306 (H) 03/22/2017    Review of Glycemic Control  Results for Rachel Nelson, Rachel Nelson (MRN [id number]) as of 03/22/2017 13:43  Ref. Range 03/21/2017 10:03 03/21/2017 22:28 03/22/2017 07:46 03/22/2017 11:54  Glucose-Capillary Latest Ref Range: 65 - 99 mg/dL 914252 (H) 782248 (H) 956237 (H) 306 (H)    Diabetes history: Type 2 Outpatient Diabetes medications:Metformin 500mg  bid Current orders for Inpatient glycemic control: Novolog 0-9 units tid, Novolog 0-5 units qhs  Inpatient Diabetes Program Recommendations:  Consider starting low dose Lantus insulin 11 units qhs (0.2 unit/kg)- fasting blood sugar 237mg /dl.   Susette RacerJulie Enyla Lisbon, RN, BA, MHA, CDE Diabetes Coordinator Inpatient Diabetes Program  [phone] (Team Pager) [phone] [facility] Office) 03/22/2017 1:47 PM

## 2017-03-22 NOTE — Progress Notes (Signed)
Unable to complete admission at this time due to language barrier. Pt speaks Urdu which was not available on the video translator and pt's Englis speaking son had left for the night. Pt speaks very little [place]. I speak no Urdu.

## 2017-03-22 NOTE — Care Management Note (Signed)
Case Management Note  Patient Details  Name: Rachel Nelson MRN: [id number] Date of Birth: 13-Sep-1951  Subjective/Objective:                 Spoke w patient's son at the bedside. He states that his mom has lived in the [place] for the past 6 months. Admitted w pyelo, on IV Abx. She is staying in a home with him, his bother, and their two wives. She was independent and active pta. She does not speak [place], speaks Urdu. Patient has PCP and coverage w [employer]. No CM concerns at this time, will continue to follow for DC needs.    Action/Plan:   Expected Discharge Date:                  Expected Discharge Plan:  Home/Self Care  In-House Referral:     Discharge planning Services  CM Consult  Post Acute Care Choice:    Choice offered to:     DME Arranged:    DME Agency:     HH Arranged:    HH Agency:     Status of Service:  In process, will continue to follow  If discussed at Long Length of Stay Meetings, dates discussed:    Additional Comments:  Lawerance SabalDebbie Mialani Reicks, RN 03/22/2017, 2:16 PM

## 2017-03-22 NOTE — Progress Notes (Signed)
Progress Note    Rachel Nelson  NWG:[id number] DOB: [birth year]  DOA: 03/21/2017 PCP: Georgianne Fick, MD    Brief Narrative:   Chief complaint: Follow-up pyelonephritis  Medical records reviewed and are as summarized below:  Rachel Nelson is an 66 y.o. female the PMH of non-insulin-dependent type 2 diabetes who was admitted 03/21/17 for evaluation of progressive left flank pain associated with constitutional symptoms. Urinalysis was positive for signs of infection. WBC was 23.  Assessment/Plan:   Principal Problem:   Acute pyelonephritis Improving with Rocephin. Preliminary cultures growing GNR.  Active Problems:   Hyponatremia Sodium worse today.Check urine/serum osmolality and urine sodium levels. Check TSH.    Normocytic anemia Hemoglobin dropped to 8 today, likely dilutional. Monitor. Anemia panel consistent with iron deficiency/anemia of chronic disease.    Renal insufficiency Baseline creatinine not known. Mild decrease with hydration, but kidney function remains impaired. Monitor.    Diabetes mellitus type II, non insulin dependent (HCC) Suboptimal control. Will change SSI to moderate scale with 4 units of meal coverage.    Underweight Body mass index is 19.81 kg/m.   Family Communication/Anticipated D/C date and plan/Code Status   DVT prophylaxis: Lovenox ordered. Code Status: Full Code.  Family Communication: No family at bedside. Disposition Plan: Home when sodium improved.   Medical Consultants:    None.   Procedures:    None  Anti-Infectives:    Rocephin 03/21/17--->  Subjective:   Reports that her pain is improved and her appetite is picking up. No diarrhea.  Objective:    Vitals:   03/21/17 2223 03/21/17 2311 03/22/17 0413 03/22/17 1413  BP: 136/70 (!) 141/58 (!) 121/57 (!) 123/48  Pulse: 81 84 86 83  Resp:  18 16 18   Temp:  99.8 F (37.7 C) 99.6 F (37.6 C) 98.5 F (36.9 C)  TempSrc:   Oral Oral  SpO2: 99% 99% 98% 100%    Weight:      Height:        Intake/Output Summary (Last 24 hours) at 03/22/17 1600 Last data filed at 03/22/17 0317  Gross per 24 hour  Intake           976.67 ml  Output              200 ml  Net           776.67 ml   Filed Weights   03/21/17 0954  Weight: 54 kg (119 lb 0.8 oz)    Exam: General exam: Appears calm and comfortable. Sitting up in bed, eating breakfast. Respiratory system: Clear to auscultation. Respiratory effort normal. Cardiovascular system: S1 & S2 heard, RRR. No JVD,  rubs, gallops or clicks. No murmurs. Gastrointestinal system: Abdomen is nondistended, soft and nontender. No organomegaly or masses felt. Normal bowel sounds heard. Central nervous system: Alert and oriented. No focal neurological deficits. Extremities: No clubbing,  or cyanosis. No edema. Skin: No rashes, lesions or ulcers. Psychiatry: Judgement and insight appear normal. Mood & affect appropriate.   Data Reviewed:   I have personally reviewed following labs and imaging studies:  Labs: Sodium 124 (was 125 yesterday), chloride 98, bicarbonate 19, glucose 260, creatinine 1.19, calcium 7.5 with an estimated creatinine clearance of 40.2. LFTs WNL. Albumin low at 2.1. Lipase normal at 38. WBC 20.2 from 23. Left shift. Hemoglobin 8 from 9.2. CBGs 237-306. Anemia panel shows low iron and TIBC, but normal B-12 and folic acid levels. Normal ferritin. Urine culture growing greater than 100,000 colonies of  gram-negative rods with speciation and sensitivities pending.  Radiology: CT renal stone study 03/21/17: Mild hydronephrosis in the upper pole of the left kidney with left perinephric stranding consistent with pyelonephritis.  Medications:   . enoxaparin (LOVENOX) injection  40 mg Subcutaneous Q24H  . feeding supplement (ENSURE ENLIVE)  237 mL Oral BID BM  . gemfibrozil  600 mg Oral BID  . insulin aspart  0-5 Units Subcutaneous QHS  . insulin aspart  0-9 Units Subcutaneous TID WC  . [START ON  03/23/2017] pneumococcal 23 valent vaccine  0.5 mL Intramuscular Tomorrow-1000  . sodium chloride flush  3 mL Intravenous Q12H   Continuous Infusions: . cefTRIAXone (ROCEPHIN)  IV Stopped (03/22/17 1404)    Medical decision making is of high complexity and this patient is at high risk of deterioration, therefore this is a level 3 visit.  (> 4 problem points, >4 data points, mod risk: Need 2 out of 3)    Problems/DDx Points   Self limited or minor (max 2)       1   2  Established problem, stable       1   1  Established problem, worsening       2   2  New problem, no additional W/U planned (max 1)       3   New problem, additional W/U planned        4    Data Reviewed Points   Review/order clinical lab tests       1   1  Review/order x-rays       1   Review/order tests (Echo, EKG, PFTs, etc)       1   Discussion of test results w/ performing MD       1   Independent review of image, tracing or specimen       2   2  Decision to obtain old records       1   Review and summation of old records       2   2   Level of risk Presenting prob Diagnostics Management   Minimal 1 self limited/minor Labs CXR EKG/EEG U/A U/S Rest Gargles Bandages Dressings   Low 2 or more self limited/minor 1 stable chronic Acute uncomplicated illness Tests (PFTS) Non-CV imaging Arterial labs Biopsies of skin OTC drugs Minor surgery-no risk PT OT IVF without additives    Moderate 1 or more chronic illnesses w/ mild exac, progression or S/E from tx 2 or more stable chronic illnesses Undiagnosed new problem w/ uncertain prognosis Acute complicated injury  Stress tests Endoscopies with no risk factors Deep needle or incisional bx CV imaging without risk LP Thoracentesis Paracentesis Minor surgery w/ risks Elective major surgery w/ no risk (open, percutaneous or endoscopic) Prescription drugs Therapeutic nucl med IVF with additives Closed tx of fracture/dislocation    High Severe exac of  chronic illness Acute or chronic illness/injury may pose a threat to life or bodily function (ARF) Change in neuro status    CV imaging w/ contrast and risk Cardio electophysiologic tests Endoscopies w/ risk Discography Elective major surgery Emergency major surgery Parenteral controlled substances Drug therapy req monitoring for toxicity DNR/de-escalation of care    MDM Prob points Data points Risk   Straightforward    <1    <1    Min   Low complexity    2    2    Low   Moderate    3  3    Mod   High Complexity    4 or more    4 or more    High      LOS: 1 day   RAMA,CHRISTINA  [facility] Pager [phone]. If unable to reach me by pager, please call my cell phone at [phone].  *Please refer to [email or web address], password [email or web address] to get updated schedule on who will round on this patient, as hospitalists switch teams weekly. If 7PM-7AM, please contact night-coverage at [email or web address], password [email or web address] for any overnight needs.  03/22/2017, 4:00 PM

## 2017-03-23 DIAGNOSIS — N10 Acute tubulo-interstitial nephritis: Principal | ICD-10-CM

## 2017-03-23 DIAGNOSIS — D649 Anemia, unspecified: Secondary | ICD-10-CM

## 2017-03-23 DIAGNOSIS — N12 Tubulo-interstitial nephritis, not specified as acute or chronic: Secondary | ICD-10-CM

## 2017-03-23 DIAGNOSIS — E871 Hypo-osmolality and hyponatremia: Secondary | ICD-10-CM

## 2017-03-23 DIAGNOSIS — E119 Type 2 diabetes mellitus without complications: Secondary | ICD-10-CM

## 2017-03-23 LAB — CBC
HCT: 24.2 % — ABNORMAL LOW (ref 36.0–46.0)
HEMOGLOBIN: 8.1 g/dL — AB (ref 12.0–15.0)
MCH: 27.8 pg (ref 26.0–34.0)
MCHC: 33.5 g/dL (ref 30.0–36.0)
MCV: 83.2 fL (ref 78.0–100.0)
Platelets: 348 10*3/uL (ref 150–400)
RBC: 2.91 MIL/uL — AB (ref 3.87–5.11)
RDW: 13.8 % (ref 11.5–15.5)
WBC: 23.8 10*3/uL — ABNORMAL HIGH (ref 4.0–10.5)

## 2017-03-23 LAB — BASIC METABOLIC PANEL
ANION GAP: 7 (ref 5–15)
BUN: 14 mg/dL (ref 6–20)
CALCIUM: 7.9 mg/dL — AB (ref 8.9–10.3)
CHLORIDE: 99 mmol/L — AB (ref 101–111)
CO2: 21 mmol/L — AB (ref 22–32)
CREATININE: 1.17 mg/dL — AB (ref 0.44–1.00)
GFR calc Af Amer: 55 mL/min — ABNORMAL LOW (ref >60)
GFR calc non Af Amer: 48 mL/min — ABNORMAL LOW (ref >60)
GLUCOSE: 199 mg/dL — AB (ref 65–99)
Potassium: 4.9 mmol/L (ref 3.5–5.1)
Sodium: 127 mmol/L — ABNORMAL LOW (ref 135–145)

## 2017-03-23 LAB — OSMOLALITY, URINE: OSMOLALITY UR: 323 mosm/kg (ref 300–900)

## 2017-03-23 LAB — URINE CULTURE: Culture: >=100000 — AB

## 2017-03-23 LAB — GLUCOSE, CAPILLARY
GLUCOSE-CAPILLARY: 215 mg/dL — AB (ref 65–99)
GLUCOSE-CAPILLARY: 316 mg/dL — AB (ref 65–99)
Glucose-Capillary: 258 mg/dL — ABNORMAL HIGH (ref 65–99)
Glucose-Capillary: 276 mg/dL — ABNORMAL HIGH (ref 65–99)

## 2017-03-23 LAB — SODIUM, URINE, RANDOM: Sodium, Ur: 49 mmol/L

## 2017-03-23 LAB — UREA NITROGEN, URINE: Urea Nitrogen, Ur: 706 mg/dL

## 2017-03-23 MED ORDER — SODIUM CHLORIDE 0.9 % IV SOLN
1.0000 g | Freq: Two times a day (BID) | INTRAVENOUS | Status: DC
  Administered 2017-03-23 – 2017-03-24 (×3): 1 g via INTRAVENOUS
  Filled 2017-03-23 (×3): qty 1

## 2017-03-23 NOTE — Progress Notes (Signed)
[facility]  PROGRESS NOTE  Adron BeneMushraf Kunath ZOX:[id number] DOB: 11/29/1950 DOA: 03/21/2017 PCP: Georgianne Fickamachandran, Ajith, MD   Brief HPI:    [age] y.o. female the PMH of non-insulin-dependent type 2 diabetes who was admitted 03/21/17 for evaluation of progressive left flank pain associated with constitutional symptoms. Urinalysis was positive for signs of infection. WBC was 23.   Subjective   Patient seen and examined, denies pain today. WBC still elevated. Urine culture growing ESBL Escherichia coli.   Assessment/Plan:     1. ESBL Escherichia coli pyelonephritis- WBC still elevated, urine culture growing ESBL Escherichia coli. We'll start meropenem per pharmacy consultation 2. Diabetes mellitus- blood glucose is elevated to 200s. We'll start Lantus 10 units subcutaneous daily. Continue sliding scale insulin with NovoLog. Will check hemoglobin A1c. 3. Hyponatremia-sodium still 127, serum osmolality is 273. We'll start fluid restriction 1200 mL per day. Follow BMP in a.m. 4. Hyperlipidemia-continue Lopid. 5. Normocytic anemia-hemoglobin is 8.1, anemia panel consistent with iron deficiency/anemia of chronic disease. 6. Renal insufficiency ?? Chronic- baseline creatinine not known. Care creatinine is 1.17. GFR 48. Will follow BMP in a.m.    DVT prophylaxis: Lovenox  Code Status: Full code  Family Communication: No family present at bedside   Disposition Plan: Likely will need PICC line   Consultants:  None  Procedures:  None  Continuous infusions . meropenem (MERREM) IV Stopped (03/23/17 1154)      Antibiotics:   Anti-infectives    Start     Dose/Rate Route Frequency Ordered Stop   03/23/17 1000  meropenem (MERREM) 1 g in sodium chloride 0.9 % 100 mL IVPB     1 g 200 mL/hr over 30 Minutes Intravenous Every 12 hours 03/23/17 0948     03/22/17 1500  cefTRIAXone (ROCEPHIN) 1 g in dextrose 5 % 50 mL IVPB  Status:  Discontinued     1 g 100 mL/hr over 30 Minutes Intravenous  Every 24 hours 03/21/17 2056 03/23/17 0942   03/21/17 1530  cefTRIAXone (ROCEPHIN) 1 g in dextrose 5 % 50 mL IVPB     1 g 100 mL/hr over 30 Minutes Intravenous  Once 03/21/17 1523 03/21/17 1612       Objective   Vitals:   03/22/17 1413 03/22/17 2039 03/23/17 0515 03/23/17 1427  BP: (!) 123/48 (!) 110/49 (!) 116/50 103/65  Pulse: 83 89 77 93  Resp: 18 18 18 18   Temp: 98.5 F (36.9 C) 98.7 F (37.1 C) 98.7 F (37.1 C) 98.5 F (36.9 C)  TempSrc: Oral Oral Oral Oral  SpO2: 100% 97% 99%   Weight:      Height:        Intake/Output Summary (Last 24 hours) at 03/23/17 1729 Last data filed at 03/23/17 1520  Gross per 24 hour  Intake              563 ml  Output              300 ml  Net              263 ml   Filed Weights   03/21/17 0954  Weight: 54 kg (119 lb 0.8 oz)     Physical Examination:  Physical Exam: Eyes: No icterus, extraocular muscles intact  Mouth: Oral mucosa is moist, no lesions on palate,  Neck: Supple, no deformities, masses, or tenderness Lungs: Normal respiratory effort, bilateral clear to auscultation, no crackles or wheezes.  Heart: Regular rate and rhythm, S1 and S2 normal, no murmurs, rubs auscultated  Abdomen: BS normoactive,soft,nondistended,non-tender to palpation,no organomegaly. No CVA tenderness on right or left. Extremities: No pretibial edema, no erythema, no cyanosis, no clubbing Neuro : Alert and oriented to time, place and person, No focal deficits  Skin: No rashes seen on exam     Data Reviewed: I have personally reviewed following labs and imaging studies  CBG:  Recent Labs Lab 03/22/17 2044 03/23/17 0002 03/23/17 0745 03/23/17 1220 03/23/17 1647  GLUCAP 152* 211* 215* 258* 276*    CBC:  Recent Labs Lab 03/21/17 1003 03/22/17 0549 03/23/17 0405  WBC 23.0* 20.2* 23.8*  NEUTROABS 18.7* 16.6*  --   HGB 9.2* 8.0* 8.1*  HCT 27.0* 24.0* 24.2*  MCV 82.1 83.9 83.2  PLT 386 360 348    Basic Metabolic Panel:  Recent  Labs Lab 03/21/17 1003 03/22/17 0549 03/23/17 0405  NA 125* 124* 127*  K 4.7 4.5 4.9  CL 94* 98* 99*  CO2 22 19* 21*  GLUCOSE 258* 260* 199*  BUN 22* 17 14  CREATININE 1.34* 1.19* 1.17*  CALCIUM 8.2* 7.5* 7.9*    Recent Results (from the past 240 hour(s))  Urine culture     Status: Abnormal   Collection Time: 03/21/17 10:07 AM  Result Value Ref Range Status   Specimen Description URINE, RANDOM  Final   Special Requests NONE  Final   Culture (A)  Final    >=100,000 COLONIES/mL ESCHERICHIA COLI Confirmed Extended Spectrum Beta-Lactamase Producer (ESBL)    Report Status 03/23/2017 FINAL  Final   Organism ID, Bacteria ESCHERICHIA COLI (A)  Final      Susceptibility   Escherichia coli - MIC*    AMPICILLIN >=32 RESISTANT Resistant     CEFAZOLIN >=64 RESISTANT Resistant     CEFTRIAXONE >=64 RESISTANT Resistant     CIPROFLOXACIN >=4 RESISTANT Resistant     GENTAMICIN >=16 RESISTANT Resistant     IMIPENEM <=0.25 SENSITIVE Sensitive     NITROFURANTOIN <=16 SENSITIVE Sensitive     TRIMETH/SULFA <=20 SENSITIVE Sensitive     AMPICILLIN/SULBACTAM >=32 RESISTANT Resistant     PIP/TAZO 8 SENSITIVE Sensitive     Extended ESBL POSITIVE Resistant     * >=100,000 COLONIES/mL ESCHERICHIA COLI     Liver Function Tests:  Recent Labs Lab 03/21/17 1003  AST 27  ALT 37  ALKPHOS 120  BILITOT 0.8  PROT 7.5  ALBUMIN 2.1*    Recent Labs Lab 03/21/17 1003  LIPASE 38   No results for input(s): AMMONIA in the last 168 hours.  Cardiac Enzymes: No results for input(s): CKTOTAL, CKMB, CKMBINDEX, TROPONINI in the last 168 hours. BNP (last 3 results) No results for input(s): BNP in the last 8760 hours.  ProBNP (last 3 results) No results for input(s): PROBNP in the last 8760 hours.    Studies: No results found.  Scheduled Meds: . enoxaparin (LOVENOX) injection  40 mg Subcutaneous Q24H  . feeding supplement (ENSURE ENLIVE)  237 mL Oral BID BM  . gemfibrozil  600 mg Oral BID   . insulin aspart  0-15 Units Subcutaneous TID WC  . insulin aspart  0-5 Units Subcutaneous QHS  . insulin aspart  4 Units Subcutaneous TID WC  . pneumococcal 23 valent vaccine  0.5 mL Intramuscular Tomorrow-1000  . sodium chloride flush  3 mL Intravenous Q12H      Time spent: 25 min  [facility]   [facility] Pager [phone]. If 7PM-7AM, please contact night-coverage at [email or web address], Office  [phone]  password [facility]  03/23/2017, 5:29  PM  LOS: 2 days

## 2017-03-23 NOTE — Progress Notes (Signed)
Nutrition Brief Note  Patient identified on the Malnutrition Screening Tool (MST) Report  Wt Readings from Last 15 Encounters:  03/21/17 119 lb 0.8 oz (54 kg)    Body mass index is 19.81 kg/m. Patient meets criteria for Normal based on current BMI.   Current diet order is carb modified, patient is consuming approximately 100% of meals at this time. Labs and medications reviewed.   No nutrition interventions warranted at this time. If nutrition issues arise, please consult RD.   Pt receiving Ensure Enlive BID.  Rachel Nelson RD, LDN Pager # - [phone]

## 2017-03-23 NOTE — Progress Notes (Addendum)
Inpatient Diabetes Program Recommendations  AACE/ADA: New Consensus Statement on Inpatient Glycemic Control (2015)  Target Ranges:  Prepandial:   less than 140 mg/dL      Peak postprandial:   less than 180 mg/dL (1-2 hours)      Critically ill patients:  140 - 180 mg/dL   Results for Rachel Nelson, Rachel Nelson (MRN [id number]) as of 03/23/2017 10:01  Ref. Range 03/22/2017 07:46 03/22/2017 11:54 03/22/2017 16:55 03/22/2017 20:44 03/23/2017 00:02 03/23/2017 07:45  Glucose-Capillary Latest Ref Range: 65 - 99 mg/dL 657237 (H) 846306 (H) 962172 (H) 152 (H) 211 (H) 215 (H)   Review of Glycemic Control  Diabetes history: DM2 Outpatient Diabetes medications: Metformin 500 mg BID Current orders for Inpatient glycemic control: Novolog 0-15 units TID with meals, Novolog 0-5 units QHS, Novolog 4 units TID with meals for meal coverage  Inpatient Diabetes Program Recommendations: Insulin - Basal: If glucose continues to be greater than 180 mg/dl, please consider ordering Lantus 10 units Q24H. HgbA1C: Please consider ordering an A1C to evaluate glycemic control over the past 2-3 months.  Thanks, Orlando PennerMarie Mabel Roll, RN, MSN, CDE Diabetes Coordinator Inpatient Diabetes Program [phone] (Team Pager from 8am to 5pm)

## 2017-03-23 NOTE — Progress Notes (Signed)
Pharmacy Antibiotic Note  Rachel Nelson is a 66 y.o. female admitted on 03/21/2017 with UTI.  Pharmacy has been consulted for meropenem dosing. Pt is afebrile and WBC is elevated at 23.8. SCr has decreased to 1.17.   Plan: Meropenem 1gm IV Q12H F/u renal fxn, C&S, clinical status and LOT  Height: 5\' 5"  (165.1 cm) Weight: 119 lb 0.8 oz (54 kg) IBW/kg (Calculated) : 57  Temp (24hrs), Avg:98.6 F (37 C), Min:98.5 F (36.9 C), Max:98.7 F (37.1 C)   Recent Labs Lab 03/21/17 1003 03/22/17 0549 03/23/17 0405  WBC 23.0* 20.2* 23.8*  CREATININE 1.34* 1.19* 1.17*    Estimated Creatinine Clearance: 40.9 mL/min (A) (by C-G formula based on SCr of 1.17 mg/dL (H)).    No Known Allergies  Antimicrobials this admission: Meropenem 7/11>> CTX x 1 7/10  Dose adjustments this admission: N/A  Microbiology results: 7/9 Urine - ESBL EColi  Thank you for allowing pharmacy to be a part of this patient's care.  Audon Heymann, Drake LeachRachel Lynn 03/23/2017 9:48 AM

## 2017-03-23 NOTE — Discharge Instructions (Signed)

## 2017-03-24 DIAGNOSIS — N289 Disorder of kidney and ureter, unspecified: Secondary | ICD-10-CM

## 2017-03-24 LAB — CBC
HCT: 23.9 % — ABNORMAL LOW (ref 36.0–46.0)
Hemoglobin: 8 g/dL — ABNORMAL LOW (ref 12.0–15.0)
MCH: 27.7 pg (ref 26.0–34.0)
MCHC: 33.5 g/dL (ref 30.0–36.0)
MCV: 82.7 fL (ref 78.0–100.0)
Platelets: 410 10*3/uL — ABNORMAL HIGH (ref 150–400)
RBC: 2.89 MIL/uL — ABNORMAL LOW (ref 3.87–5.11)
RDW: 13.8 % (ref 11.5–15.5)
WBC: 23.8 10*3/uL — ABNORMAL HIGH (ref 4.0–10.5)

## 2017-03-24 LAB — BASIC METABOLIC PANEL
Anion gap: 7 (ref 5–15)
BUN: 14 mg/dL (ref 6–20)
CO2: 23 mmol/L (ref 22–32)
Calcium: 7.9 mg/dL — ABNORMAL LOW (ref 8.9–10.3)
Chloride: 96 mmol/L — ABNORMAL LOW (ref 101–111)
Creatinine, Ser: 1.13 mg/dL — ABNORMAL HIGH (ref 0.44–1.00)
GFR calc Af Amer: 58 mL/min — ABNORMAL LOW (ref >60)
GFR calc non Af Amer: 50 mL/min — ABNORMAL LOW (ref >60)
Glucose, Bld: 247 mg/dL — ABNORMAL HIGH (ref 65–99)
Potassium: 5.2 mmol/L — ABNORMAL HIGH (ref 3.5–5.1)
Sodium: 126 mmol/L — ABNORMAL LOW (ref 135–145)

## 2017-03-24 LAB — GLUCOSE, CAPILLARY: Glucose-Capillary: 243 mg/dL — ABNORMAL HIGH (ref 65–99)

## 2017-03-24 MED ORDER — INSULIN GLARGINE 100 UNIT/ML ~~LOC~~ SOLN: 10.0000 [IU] | Freq: Every day | SUBCUTANEOUS | 11 refills | Status: DC

## 2017-03-24 MED ORDER — SULFAMETHOXAZOLE-TRIMETHOPRIM 800-160 MG PO TABS: 1.0000 | ORAL_TABLET | Freq: Two times a day (BID) | ORAL | 0 refills | Status: DC

## 2017-03-24 NOTE — Discharge Summary (Signed)
Physician Discharge Summary  Rachel Nelson WUJ:[id number] DOB: Aug 15, 1951 DOA: 03/21/2017  PCP: Georgianne Fick, MD  Admit date: 03/21/2017 Discharge date: 03/24/2017  Time spent: 25* minutes  Recommendations for Outpatient Follow-up:  1. Follow up PCP in 2 weeks to check CBC And BMP 2. Will be discharged home on Po Bactrim DS 1 tab po bid for 10 days 3. Will need workup for adrenal insufficiency as outpatient. 4. Also check fasting lipid profile, hemoglobin A1c As outpatient 5. Patient will be discharged on Lantus 10 units subcutaneous daily   Discharge Diagnoses:  Principal Problem:   Acute pyelonephritis Active Problems:   Hyponatremia   Normocytic anemia   Renal insufficiency   Diabetes mellitus type II, non insulin dependent (HCC)   Pyelonephritis   Discharge Condition: Stable  Diet recommendation: Carb modified diet  Filed Weights   03/21/17 0954  Weight: 54 kg (119 lb 0.8 oz)    History of present illness:  66 y.o.femalethe PMH of non-insulin-dependent type 2 diabetes who was admitted 03/21/17 for evaluation of progressive left flank pain associated with constitutional symptoms. Urinalysis was positive for signs of infection. WBC was 23.   Hospital Course:  1. ESBL Escherichia coli pyelonephritis- WBC still elevated, urine culture growing ESBL Escherichia coli. Patient was started on meropenem per pharmacy consultation. I called and discussed with infectious disease on-call Dr. Cliffton Asters, who recommends to discharge patient on Bactrim DS 1 tablet by mouth twice a day for 10 days as Escherichia coli is sensitive to Bactrim as per sensitivity reports. Patient refused to get a PICC line. 2. Diabetes mellitus- blood glucose is elevated to 200s. We'll start Lantus 10 units subcutaneous daily. Can check hemoglobin A1c as outpatient. 3. Hyponatremia-sodium still 126, serum osmolality is 273.  Could be due to elevated blood glucose, or hypertriglyceridemia. She will need  fasting lipid profile as outpatient. Also will need workup for adrenal insufficiency as outpatient. 4. Hyperlipidemia-continue Lopid. Check fasting lipid profile as outpatient. 5. Normocytic anemia-hemoglobin is 8.1, anemia panel consistent with iron deficiency/anemia of chronic disease. 6. Renal insufficiency ?? Chronic- baseline creatinine not known. Today creatinine is 1.13. GFR 48.   Procedures:  None  Consultations:  None  Discharge Exam: Vitals:   03/23/17 2216 03/24/17 0515  BP: (!) 104/48 (!) 112/45  Pulse: 80 80  Resp: 17 19  Temp: 98.7 F (37.1 C) 99 F (37.2 C)    General: Appears in no acute distress Cardiovascular: S1-S2, regular Respiratory: Clear to auscultation bilaterally  Discharge Instructions   Discharge Instructions    Diet - low sodium heart healthy    Complete by:  As directed    Discharge instructions    Complete by:  As directed    Follow up PCP in 2 weeks to check blood work CBC   Increase activity slowly    Complete by:  As directed      Current Discharge Medication List    START taking these medications   Details  sulfamethoxazole-trimethoprim (BACTRIM DS,SEPTRA DS) 800-160 MG tablet Take 1 tablet by mouth 2 (two) times daily. Qty: 20 tablet, Refills: 0    Lantus 10 units subcutaneous daily at bedtime     CONTINUE these medications which have NOT CHANGED   Details  acetaminophen (TYLENOL) 325 MG tablet Take 325 mg by mouth every 6 (six) hours as needed for mild pain or headache.    gemfibrozil (LOPID) 600 MG tablet Take 600 mg by mouth 2 (two) times daily.    metFORMIN (GLUCOPHAGE-XR) 500  MG 24 hr tablet Take 500 mg by mouth 2 (two) times daily with a meal.    ondansetron (ZOFRAN) 4 MG tablet Take 1 tablet (4 mg total) by mouth every 6 (six) hours. Prn n/vRob Bunting: 6 tablet, Refills: 0       No Known Allergies Follow-up Information    Georgianne Fick, MD Follow up in 2 week(s).   Specialty:  Internal Medicine Why:  Check  CBC in 2 weeks Contact information: [address] [place] [address] [place] [address] [phone]            The results of significant diagnostics from this hospitalization (including imaging, microbiology, ancillary and laboratory) are listed below for reference.    Significant Diagnostic Studies: Ct Renal Stone Study  Result Date: 03/21/2017 CLINICAL DATA:  Left flank pain for 1 week EXAM: CT ABDOMEN AND PELVIS WITHOUT CONTRAST TECHNIQUE: Multidetector CT imaging of the abdomen and pelvis was performed following the standard protocol without IV contrast. COMPARISON:  None. FINDINGS: Lower chest: There is atelectasis of the bilateral posterior lung bases. The heart size is normal. Hepatobiliary: No focal liver abnormality is seen. No gallstones, gallbladder wall thickening, or biliary dilatation. Pancreas: Unremarkable. No pancreatic ductal dilatation or surrounding inflammatory changes. Spleen: Normal in size without focal abnormality. Adrenals/Urinary Tract: There is mild hydronephrosis upper pole of left kidney. There is left perinephric stranding. There are no stones identified in both kidneys or in the course of the bilateral ureters. The bladder is fluid filled without gross abnormality. Stomach/Bowel: Stomach is within normal limits. Appendix appears normal. No evidence of bowel wall thickening, distention, or inflammatory changes. Vascular/Lymphatic: Aortic atherosclerosis. No enlarged abdominal or pelvic lymph nodes. Reproductive: The uterus is normal. There is a 2 mm calcification in the left ovary. Other: There is no ascites or hernia identified. Musculoskeletal: Degenerative joint changes of the spine are noted. IMPRESSION: Mild hydronephrosis noted in the upper pole of left kidney an left perinephric stranding without focal stone identified in either kidney or collecting systems. The findings can be due to recently passed stone or pyelonephritis. Electronically Signed   By:  Sherian Rein M.D.   On: 03/21/2017 16:33    Microbiology: Recent Results (from the past 240 hour(s))  Urine culture     Status: Abnormal   Collection Time: 03/21/17 10:07 AM  Result Value Ref Range Status   Specimen Description URINE, RANDOM  Final   Special Requests NONE  Final   Culture (A)  Final    >=100,000 COLONIES/mL ESCHERICHIA COLI Confirmed Extended Spectrum Beta-Lactamase Producer (ESBL)    Report Status 03/23/2017 FINAL  Final   Organism ID, Bacteria ESCHERICHIA COLI (A)  Final      Susceptibility   Escherichia coli - MIC*    AMPICILLIN >=32 RESISTANT Resistant     CEFAZOLIN >=64 RESISTANT Resistant     CEFTRIAXONE >=64 RESISTANT Resistant     CIPROFLOXACIN >=4 RESISTANT Resistant     GENTAMICIN >=16 RESISTANT Resistant     IMIPENEM <=0.25 SENSITIVE Sensitive     NITROFURANTOIN <=16 SENSITIVE Sensitive     TRIMETH/SULFA <=20 SENSITIVE Sensitive     AMPICILLIN/SULBACTAM >=32 RESISTANT Resistant     PIP/TAZO 8 SENSITIVE Sensitive     Extended ESBL POSITIVE Resistant     * >=100,000 COLONIES/mL ESCHERICHIA COLI     Labs: Basic Metabolic Panel:  Recent Labs Lab 03/21/17 1003 03/22/17 0549 03/23/17 0405 03/24/17 0554  NA 125* 124* 127* 126*  K 4.7 4.5 4.9 5.2*  CL  94* 98* 99* 96*  CO2 22 19* 21* 23  GLUCOSE 258* 260* 199* 247*  BUN 22* 17 14 14   CREATININE 1.34* 1.19* 1.17* 1.13*  CALCIUM 8.2* 7.5* 7.9* 7.9*   Liver Function Tests:  Recent Labs Lab 03/21/17 1003  AST 27  ALT 37  ALKPHOS 120  BILITOT 0.8  PROT 7.5  ALBUMIN 2.1*    Recent Labs Lab 03/21/17 1003  LIPASE 38   No results for input(s): AMMONIA in the last 168 hours. CBC:  Recent Labs Lab 03/21/17 1003 03/22/17 0549 03/23/17 0405 03/24/17 0554  WBC 23.0* 20.2* 23.8* 23.8*  NEUTROABS 18.7* 16.6*  --   --   HGB 9.2* 8.0* 8.1* 8.0*  HCT 27.0* 24.0* 24.2* 23.9*  MCV 82.1 83.9 83.2 82.7  PLT 386 360 348 410*    CBG:  Recent Labs Lab 03/23/17 0745 03/23/17 1220  03/23/17 1647 03/23/17 2219 03/24/17 0747  GLUCAP 215* 258* 276* 316* 243*       Signed:  Meredeth IdeLAMA,Camrynn Mcclintic S MD.  [facility] 03/24/2017, 10:47 AM

## 2017-03-24 NOTE — Progress Notes (Signed)
Discharged to home, Discharge instructions and follow up appointments discussed with patient, with son at bedside. Educated on how to administer  self Insulin injections but son said she already knows because she was taking care of her diabetic husband.

## 2017-03-31 DIAGNOSIS — E1165 Type 2 diabetes mellitus with hyperglycemia: Secondary | ICD-10-CM | POA: Diagnosis not present

## 2017-03-31 DIAGNOSIS — Z09 Encounter for follow-up examination after completed treatment for conditions other than malignant neoplasm: Secondary | ICD-10-CM | POA: Diagnosis not present

## 2017-03-31 DIAGNOSIS — N390 Urinary tract infection, site not specified: Secondary | ICD-10-CM | POA: Diagnosis not present

## 2017-03-31 DIAGNOSIS — E781 Pure hyperglyceridemia: Secondary | ICD-10-CM | POA: Diagnosis not present

## 2017-04-11 DIAGNOSIS — E1165 Type 2 diabetes mellitus with hyperglycemia: Secondary | ICD-10-CM | POA: Diagnosis not present

## 2017-04-11 DIAGNOSIS — E781 Pure hyperglyceridemia: Secondary | ICD-10-CM | POA: Diagnosis not present

## 2017-04-11 DIAGNOSIS — N12 Tubulo-interstitial nephritis, not specified as acute or chronic: Secondary | ICD-10-CM | POA: Diagnosis not present

## 2017-04-11 DIAGNOSIS — D509 Iron deficiency anemia, unspecified: Secondary | ICD-10-CM | POA: Diagnosis not present

## 2017-10-12 DIAGNOSIS — N390 Urinary tract infection, site not specified: Secondary | ICD-10-CM | POA: Diagnosis not present

## 2017-10-12 DIAGNOSIS — E781 Pure hyperglyceridemia: Secondary | ICD-10-CM | POA: Diagnosis not present

## 2017-10-12 DIAGNOSIS — E1165 Type 2 diabetes mellitus with hyperglycemia: Secondary | ICD-10-CM | POA: Diagnosis not present

## 2017-10-12 DIAGNOSIS — R030 Elevated blood-pressure reading, without diagnosis of hypertension: Secondary | ICD-10-CM | POA: Diagnosis not present

## 2018-05-19 DIAGNOSIS — D509 Iron deficiency anemia, unspecified: Secondary | ICD-10-CM | POA: Diagnosis not present

## 2018-05-19 DIAGNOSIS — Z23 Encounter for immunization: Secondary | ICD-10-CM | POA: Diagnosis not present

## 2018-05-19 DIAGNOSIS — E781 Pure hyperglyceridemia: Secondary | ICD-10-CM | POA: Diagnosis not present

## 2018-05-19 DIAGNOSIS — E1165 Type 2 diabetes mellitus with hyperglycemia: Secondary | ICD-10-CM | POA: Diagnosis not present

## 2018-05-19 DIAGNOSIS — N390 Urinary tract infection, site not specified: Secondary | ICD-10-CM | POA: Diagnosis not present

## 2018-05-26 DIAGNOSIS — E1165 Type 2 diabetes mellitus with hyperglycemia: Secondary | ICD-10-CM | POA: Diagnosis not present

## 2018-05-26 DIAGNOSIS — R030 Elevated blood-pressure reading, without diagnosis of hypertension: Secondary | ICD-10-CM | POA: Diagnosis not present

## 2018-05-26 DIAGNOSIS — E781 Pure hyperglyceridemia: Secondary | ICD-10-CM | POA: Diagnosis not present

## 2019-05-15 DIAGNOSIS — E1165 Type 2 diabetes mellitus with hyperglycemia: Secondary | ICD-10-CM | POA: Diagnosis not present

## 2019-05-15 DIAGNOSIS — D509 Iron deficiency anemia, unspecified: Secondary | ICD-10-CM | POA: Diagnosis not present

## 2019-05-15 DIAGNOSIS — N390 Urinary tract infection, site not specified: Secondary | ICD-10-CM | POA: Diagnosis not present

## 2019-05-15 DIAGNOSIS — E781 Pure hyperglyceridemia: Secondary | ICD-10-CM | POA: Diagnosis not present

## 2019-06-11 DIAGNOSIS — N183 Chronic kidney disease, stage 3 (moderate): Secondary | ICD-10-CM | POA: Diagnosis not present

## 2019-06-11 DIAGNOSIS — Z23 Encounter for immunization: Secondary | ICD-10-CM | POA: Diagnosis not present

## 2019-06-11 DIAGNOSIS — E875 Hyperkalemia: Secondary | ICD-10-CM | POA: Diagnosis not present

## 2019-06-11 DIAGNOSIS — E781 Pure hyperglyceridemia: Secondary | ICD-10-CM | POA: Diagnosis not present

## 2019-06-11 DIAGNOSIS — D649 Anemia, unspecified: Secondary | ICD-10-CM | POA: Diagnosis not present

## 2019-06-11 DIAGNOSIS — E1165 Type 2 diabetes mellitus with hyperglycemia: Secondary | ICD-10-CM | POA: Diagnosis not present

## 2019-06-11 DIAGNOSIS — Z0000 Encounter for general adult medical examination without abnormal findings: Secondary | ICD-10-CM | POA: Diagnosis not present

## 2019-07-16 DIAGNOSIS — D649 Anemia, unspecified: Secondary | ICD-10-CM | POA: Diagnosis not present

## 2019-09-09 ENCOUNTER — Emergency Department (HOSPITAL_COMMUNITY): Payer: BC Managed Care – PPO

## 2019-09-09 ENCOUNTER — Other Ambulatory Visit: Payer: Self-pay

## 2019-09-09 ENCOUNTER — Encounter (HOSPITAL_COMMUNITY): Payer: Self-pay

## 2019-09-09 ENCOUNTER — Inpatient Hospital Stay (HOSPITAL_COMMUNITY)
Admission: EM | Admit: 2019-09-09 | Discharge: 2019-09-20 | DRG: 871 | Disposition: A | Discharge status: Home Health | Payer: BC Managed Care – PPO | Attending: Internal Medicine | Admitting: Internal Medicine

## 2019-09-09 DIAGNOSIS — J9690 Respiratory failure, unspecified, unspecified whether with hypoxia or hypercapnia: Secondary | ICD-10-CM | POA: Diagnosis not present

## 2019-09-09 DIAGNOSIS — N179 Acute kidney failure, unspecified: Secondary | ICD-10-CM | POA: Diagnosis not present

## 2019-09-09 DIAGNOSIS — R918 Other nonspecific abnormal finding of lung field: Secondary | ICD-10-CM | POA: Diagnosis not present

## 2019-09-09 DIAGNOSIS — Z7982 Long term (current) use of aspirin: Secondary | ICD-10-CM

## 2019-09-09 DIAGNOSIS — N132 Hydronephrosis with renal and ureteral calculous obstruction: Secondary | ICD-10-CM | POA: Diagnosis not present

## 2019-09-09 DIAGNOSIS — D72829 Elevated white blood cell count, unspecified: Secondary | ICD-10-CM | POA: Diagnosis not present

## 2019-09-09 DIAGNOSIS — E1100 Type 2 diabetes mellitus with hyperosmolarity without nonketotic hyperglycemic-hyperosmolar coma (NKHHC): Secondary | ICD-10-CM | POA: Diagnosis not present

## 2019-09-09 DIAGNOSIS — R000 Tachycardia, unspecified: Secondary | ICD-10-CM | POA: Diagnosis not present

## 2019-09-09 DIAGNOSIS — N136 Pyonephrosis: Secondary | ICD-10-CM | POA: Diagnosis present

## 2019-09-09 DIAGNOSIS — E871 Hypo-osmolality and hyponatremia: Secondary | ICD-10-CM | POA: Diagnosis not present

## 2019-09-09 DIAGNOSIS — Z87440 Personal history of urinary (tract) infections: Secondary | ICD-10-CM

## 2019-09-09 DIAGNOSIS — M5489 Other dorsalgia: Secondary | ICD-10-CM | POA: Diagnosis not present

## 2019-09-09 DIAGNOSIS — N1330 Unspecified hydronephrosis: Secondary | ICD-10-CM

## 2019-09-09 DIAGNOSIS — J9601 Acute respiratory failure with hypoxia: Secondary | ICD-10-CM

## 2019-09-09 DIAGNOSIS — I5032 Chronic diastolic (congestive) heart failure: Secondary | ICD-10-CM | POA: Diagnosis present

## 2019-09-09 DIAGNOSIS — I340 Nonrheumatic mitral (valve) insufficiency: Secondary | ICD-10-CM | POA: Diagnosis not present

## 2019-09-09 DIAGNOSIS — A419 Sepsis, unspecified organism: Secondary | ICD-10-CM | POA: Diagnosis present

## 2019-09-09 DIAGNOSIS — Z20822 Contact with and (suspected) exposure to covid-19: Secondary | ICD-10-CM | POA: Diagnosis not present

## 2019-09-09 DIAGNOSIS — E119 Type 2 diabetes mellitus without complications: Secondary | ICD-10-CM | POA: Diagnosis not present

## 2019-09-09 DIAGNOSIS — E876 Hypokalemia: Secondary | ICD-10-CM | POA: Diagnosis not present

## 2019-09-09 DIAGNOSIS — N12 Tubulo-interstitial nephritis, not specified as acute or chronic: Secondary | ICD-10-CM

## 2019-09-09 DIAGNOSIS — R0602 Shortness of breath: Secondary | ICD-10-CM | POA: Diagnosis not present

## 2019-09-09 DIAGNOSIS — E785 Hyperlipidemia, unspecified: Secondary | ICD-10-CM | POA: Diagnosis not present

## 2019-09-09 DIAGNOSIS — R451 Restlessness and agitation: Secondary | ICD-10-CM | POA: Diagnosis not present

## 2019-09-09 DIAGNOSIS — R809 Proteinuria, unspecified: Secondary | ICD-10-CM | POA: Diagnosis not present

## 2019-09-09 DIAGNOSIS — D61818 Other pancytopenia: Secondary | ICD-10-CM | POA: Diagnosis not present

## 2019-09-09 DIAGNOSIS — E1165 Type 2 diabetes mellitus with hyperglycemia: Secondary | ICD-10-CM | POA: Diagnosis not present

## 2019-09-09 DIAGNOSIS — R0902 Hypoxemia: Secondary | ICD-10-CM

## 2019-09-09 DIAGNOSIS — N10 Acute tubulo-interstitial nephritis: Secondary | ICD-10-CM | POA: Diagnosis not present

## 2019-09-09 DIAGNOSIS — F419 Anxiety disorder, unspecified: Secondary | ICD-10-CM | POA: Diagnosis present

## 2019-09-09 DIAGNOSIS — E872 Acidosis: Secondary | ICD-10-CM | POA: Diagnosis present

## 2019-09-09 DIAGNOSIS — J984 Other disorders of lung: Secondary | ICD-10-CM | POA: Diagnosis not present

## 2019-09-09 DIAGNOSIS — R6521 Severe sepsis with septic shock: Secondary | ICD-10-CM | POA: Diagnosis not present

## 2019-09-09 DIAGNOSIS — R34 Anuria and oliguria: Secondary | ICD-10-CM | POA: Diagnosis not present

## 2019-09-09 DIAGNOSIS — Z781 Physical restraint status: Secondary | ICD-10-CM | POA: Diagnosis not present

## 2019-09-09 DIAGNOSIS — R531 Weakness: Secondary | ICD-10-CM | POA: Diagnosis not present

## 2019-09-09 DIAGNOSIS — E1129 Type 2 diabetes mellitus with other diabetic kidney complication: Secondary | ICD-10-CM | POA: Diagnosis not present

## 2019-09-09 DIAGNOSIS — N201 Calculus of ureter: Secondary | ICD-10-CM | POA: Diagnosis not present

## 2019-09-09 DIAGNOSIS — Z452 Encounter for adjustment and management of vascular access device: Secondary | ICD-10-CM | POA: Diagnosis not present

## 2019-09-09 DIAGNOSIS — N390 Urinary tract infection, site not specified: Secondary | ICD-10-CM | POA: Diagnosis not present

## 2019-09-09 DIAGNOSIS — A4151 Sepsis due to Escherichia coli [E. coli]: Principal | ICD-10-CM | POA: Diagnosis present

## 2019-09-09 DIAGNOSIS — Z794 Long term (current) use of insulin: Secondary | ICD-10-CM | POA: Diagnosis not present

## 2019-09-09 DIAGNOSIS — Z7984 Long term (current) use of oral hypoglycemic drugs: Secondary | ICD-10-CM

## 2019-09-09 DIAGNOSIS — A499 Bacterial infection, unspecified: Secondary | ICD-10-CM | POA: Diagnosis not present

## 2019-09-09 DIAGNOSIS — N1339 Other hydronephrosis: Secondary | ICD-10-CM

## 2019-09-09 DIAGNOSIS — E1122 Type 2 diabetes mellitus with diabetic chronic kidney disease: Secondary | ICD-10-CM | POA: Diagnosis not present

## 2019-09-09 DIAGNOSIS — Z1612 Extended spectrum beta lactamase (ESBL) resistance: Secondary | ICD-10-CM | POA: Diagnosis not present

## 2019-09-09 DIAGNOSIS — J90 Pleural effusion, not elsewhere classified: Secondary | ICD-10-CM | POA: Diagnosis not present

## 2019-09-09 DIAGNOSIS — Z4682 Encounter for fitting and adjustment of non-vascular catheter: Secondary | ICD-10-CM | POA: Diagnosis not present

## 2019-09-09 LAB — LACTIC ACID, PLASMA
Lactic Acid, Venous: 3.1 mmol/L (ref 0.5–1.9)
Lactic Acid, Venous: 1.7 mmol/L (ref 0.5–1.9)

## 2019-09-09 LAB — URINALYSIS, ROUTINE W REFLEX MICROSCOPIC
Bilirubin Urine: NEGATIVE
Glucose, UA: NEGATIVE mg/dL
Ketones, ur: NEGATIVE mg/dL
Nitrite: NEGATIVE
Protein, ur: 100 mg/dL — AB
Specific Gravity, Urine: 1.016 (ref 1.005–1.030)
pH: 5 (ref 5.0–8.0)

## 2019-09-09 LAB — COMPREHENSIVE METABOLIC PANEL
ALT: 13 U/L (ref 0–44)
AST: 19 U/L (ref 15–41)
Albumin: 3.4 g/dL — ABNORMAL LOW (ref 3.5–5.0)
Alkaline Phosphatase: 138 U/L — ABNORMAL HIGH (ref 38–126)
Anion gap: 11 (ref 5–15)
BUN: 31 mg/dL — ABNORMAL HIGH (ref 8–23)
CO2: 19 mmol/L — ABNORMAL LOW (ref 22–32)
Calcium: 8.6 mg/dL — ABNORMAL LOW (ref 8.9–10.3)
Chloride: 101 mmol/L (ref 98–111)
Creatinine, Ser: 2.14 mg/dL — ABNORMAL HIGH (ref 0.44–1.00)
GFR calc Af Amer: 27 mL/min — ABNORMAL LOW (ref >60)
GFR calc non Af Amer: 23 mL/min — ABNORMAL LOW (ref >60)
Glucose, Bld: 205 mg/dL — ABNORMAL HIGH (ref 70–99)
Potassium: 3.3 mmol/L — ABNORMAL LOW (ref 3.5–5.1)
Sodium: 131 mmol/L — ABNORMAL LOW (ref 135–145)
Total Bilirubin: 0.9 mg/dL (ref 0.3–1.2)
Total Protein: 7.3 g/dL (ref 6.5–8.1)

## 2019-09-09 LAB — CBC WITH DIFFERENTIAL/PLATELET
Abs Immature Granulocytes: 0.03 10*3/uL (ref 0.00–0.07)
Basophils Absolute: 0 10*3/uL (ref 0.0–0.1)
Basophils Relative: 0 %
Eosinophils Absolute: 0 10*3/uL (ref 0.0–0.5)
Eosinophils Relative: 0 %
HCT: 29.5 % — ABNORMAL LOW (ref 36.0–46.0)
Hemoglobin: 9.6 g/dL — ABNORMAL LOW (ref 12.0–15.0)
Immature Granulocytes: 1 %
Lymphocytes Relative: 6 %
Lymphs Abs: 0.4 10*3/uL — ABNORMAL LOW (ref 0.7–4.0)
MCH: 28.4 pg (ref 26.0–34.0)
MCHC: 32.5 g/dL (ref 30.0–36.0)
MCV: 87.3 fL (ref 80.0–100.0)
Monocytes Absolute: 0.1 10*3/uL (ref 0.1–1.0)
Monocytes Relative: 1 %
Neutro Abs: 5.8 10*3/uL (ref 1.7–7.7)
Neutrophils Relative %: 92 %
Platelets: 200 10*3/uL (ref 150–400)
RBC: 3.38 MIL/uL — ABNORMAL LOW (ref 3.87–5.11)
RDW: 13.6 % (ref 11.5–15.5)
WBC: 6.2 10*3/uL (ref 4.0–10.5)
nRBC: 0 % (ref 0.0–0.2)

## 2019-09-09 LAB — CBG MONITORING, ED: Glucose-Capillary: 213 mg/dL — ABNORMAL HIGH (ref 70–99)

## 2019-09-09 LAB — PROTIME-INR
INR: 1.5 — ABNORMAL HIGH (ref 0.8–1.2)
Prothrombin Time: 17.6 seconds — ABNORMAL HIGH (ref 11.4–15.2)

## 2019-09-09 MED ORDER — ACETAMINOPHEN 325 MG PO TABS
650.0000 mg | ORAL_TABLET | Freq: Once | ORAL | Status: AC
  Administered 2019-09-09: 650 mg via ORAL
  Filled 2019-09-09: qty 2

## 2019-09-09 MED ORDER — NOREPINEPHRINE 4 MG/250ML-% IV SOLN
2.0000 ug/min | INTRAVENOUS | Status: DC
  Filled 2019-09-09: qty 250

## 2019-09-09 MED ORDER — SODIUM CHLORIDE 0.9 % IV SOLN
250.0000 mL | INTRAVENOUS | Status: DC
  Administered 2019-09-10 – 2019-09-13 (×3): 250 mL via INTRAVENOUS

## 2019-09-09 MED ORDER — VANCOMYCIN HCL IN DEXTROSE 1-5 GM/200ML-% IV SOLN
1000.0000 mg | Freq: Once | INTRAVENOUS | Status: AC
  Administered 2019-09-09: 1000 mg via INTRAVENOUS
  Filled 2019-09-09: qty 200

## 2019-09-09 MED ORDER — SODIUM CHLORIDE 0.9 % IV BOLUS
1000.0000 mL | Freq: Once | INTRAVENOUS | Status: AC
  Administered 2019-09-09: 1000 mL via INTRAVENOUS

## 2019-09-09 MED ORDER — SODIUM CHLORIDE 0.9 % IV BOLUS
1000.0000 mL | Freq: Once | INTRAVENOUS | Status: AC
  Administered 2019-09-10: 1000 mL via INTRAVENOUS

## 2019-09-09 MED ORDER — SODIUM CHLORIDE 0.9 % IV SOLN
1.0000 g | Freq: Once | INTRAVENOUS | Status: AC
  Administered 2019-09-09: 1 g via INTRAVENOUS
  Filled 2019-09-09: qty 1

## 2019-09-09 MED ORDER — LACTATED RINGERS IV BOLUS
1000.0000 mL | Freq: Once | INTRAVENOUS | Status: AC
  Administered 2019-09-09: 1000 mL via INTRAVENOUS

## 2019-09-09 NOTE — ED Provider Notes (Addendum)
[facility] DEPT Provider Note   CSN: [id number] Arrival date & time: 09/09/19  1552     History Chief Complaint  Patient presents with  . Fatigue    Rachel Nelson is a 68 y.o. female with a past medical history of NIDDM, presenting to the ED with 2-day history of right-sided flank pain radiating to the right side of her abdomen.  56 of history is provided by son over the phone.  States that 2 days ago patient started having these symptoms along with nausea and decreased appetite.  She started having chills as well.  They were on their way to urgent care became suddenly generally weak and they decided to call EMS.  He is concerned that this is a similar presentation to July 2018 when she presented to the ED septic with apparent pyelonephritis.  He is unsure if she had a history of kidney stones in the past.  She has been taking Tylenol with improvement in her fever.  They have been checking her blood sugars and they have been "just normal."  She continues to take her Metformin as directed.  Denies any vomiting, diarrhea, sick contacts with similar symptoms, known COVID-19 exposures, cough, complaints of chest pain, injuries or falls.  HPI     Past Medical History:  Diagnosis Date  . Acute pyelonephritis 03/21/2017  . High cholesterol   . Renal insufficiency 03/21/2017  . Type II diabetes mellitus [facility])     Patient Active Problem List   Diagnosis Date Noted  . Pyelonephritis   . Hyponatremia 03/21/2017  . Normocytic anemia 03/21/2017  . Acute pyelonephritis 03/21/2017  . Renal insufficiency 03/21/2017  . Diabetes mellitus type II, non insulin dependent ([place]) 03/21/2017    Past Surgical History:  Procedure Laterality Date  . NO PAST SURGERIES       OB History   No obstetric history on file.     No family history on file.  Social History   Tobacco Use  . Smoking status: Never Smoker  . Smokeless tobacco: Never Used  Substance Use  Topics  . Alcohol use: No  . Drug use: No    Home Medications Prior to Admission medications   Medication Sig Start Date End Date Taking? Authorizing Provider  acetaminophen (TYLENOL) 325 MG tablet Take 325 mg by mouth every 6 (six) hours as needed for mild pain or headache.   Yes [provider]  aspirin EC 81 MG tablet Take 81 mg by mouth daily.   Yes [provider]  atorvastatin (LIPITOR) 20 MG tablet Take 20 mg by mouth daily. 07/25/19  Yes [provider]  metFORMIN (GLUCOPHAGE-XR) 500 MG 24 hr tablet Take 500 mg by mouth 2 (two) times daily with a meal.   Yes [provider]  insulin glargine (LANTUS) 100 UNIT/ML injection Inject 0.1 mLs (10 Units total) into the skin at bedtime. Patient not taking: Reported on 09/09/2019 03/24/17   Oswald Hillock, MD  ondansetron (ZOFRAN) 4 MG tablet Take 1 tablet (4 mg total) by mouth every 6 (six) hours. Prn n/v. Patient not taking: Reported on 03/21/2017 07/27/16   Billy Fischer, MD  sulfamethoxazole-trimethoprim (BACTRIM DS,SEPTRA DS) 800-160 MG tablet Take 1 tablet by mouth 2 (two) times daily. Patient not taking: Reported on 09/09/2019 03/24/17   Oswald Hillock, MD    Allergies    Patient has no known allergies.  Review of Systems   Review of Systems  Constitutional: Positive for appetite change,  chills and fever.  HENT: Negative for ear pain, rhinorrhea, sneezing and sore throat.   Eyes: Negative for photophobia and visual disturbance.  Respiratory: Negative for cough, chest tightness, shortness of breath and wheezing.   Cardiovascular: Negative for chest pain and palpitations.  Gastrointestinal: Positive for abdominal pain and nausea. Negative for blood in stool, constipation, diarrhea and vomiting.  Genitourinary: Positive for flank pain. Negative for dysuria, hematuria and urgency.  Musculoskeletal: Negative for myalgias.  Skin: Negative for rash.  Neurological: Negative for dizziness, weakness and  light-headedness.    Physical Exam Updated Vital Signs BP (!) 83/52   Pulse (!) 104   Temp 98.5 F (36.9 C) (Oral)   Resp (!) 29   SpO2 95%   Physical Exam Vitals and nursing note reviewed.  Constitutional:      General: She is not in acute distress.    Appearance: She is well-developed.  HENT:     Head: Normocephalic and atraumatic.     Nose: Nose normal.  Eyes:     General: No scleral icterus.       Right eye: No discharge.        Left eye: No discharge.     Conjunctiva/sclera: Conjunctivae normal.  Cardiovascular:     Rate and Rhythm: Regular rhythm. Tachycardia present.     Heart sounds: Normal heart sounds. No murmur. No friction rub. No gallop.   Pulmonary:     Effort: Pulmonary effort is normal. No respiratory distress.     Breath sounds: Normal breath sounds.  Abdominal:     General: Bowel sounds are normal. There is no distension.     Palpations: Abdomen is soft.     Tenderness: There is abdominal tenderness. There is no guarding.  Musculoskeletal:        General: Normal range of motion.     Cervical back: Normal range of motion and neck supple.  Skin:    General: Skin is warm and dry.     Findings: No rash.  Neurological:     Mental Status: She is alert.     Motor: No abnormal muscle tone.     Coordination: Coordination normal.     ED Results / Procedures / Treatments   Labs (all labs ordered are listed, but only abnormal results are displayed) Labs Reviewed  COMPREHENSIVE METABOLIC PANEL - Abnormal; Notable for the following components:      Result Value   Sodium 131 (*)    Potassium 3.3 (*)    CO2 19 (*)    Glucose, Bld 205 (*)    BUN 31 (*)    Creatinine, Ser 2.14 (*)    Calcium 8.6 (*)    Albumin 3.4 (*)    Alkaline Phosphatase 138 (*)    GFR calc non Af Amer 23 (*)    GFR calc Af Amer 27 (*)    All other components within normal limits  LACTIC ACID, PLASMA - Abnormal; Notable for the following components:   Lactic Acid, Venous 3.1 (*)     All other components within normal limits  CBC WITH DIFFERENTIAL/PLATELET - Abnormal; Notable for the following components:   RBC 3.38 (*)    Hemoglobin 9.6 (*)    HCT 29.5 (*)    Lymphs Abs 0.4 (*)    All other components within normal limits  PROTIME-INR - Abnormal; Notable for the following components:   Prothrombin Time 17.6 (*)    INR 1.5 (*)    All other components within normal  limits  URINALYSIS, ROUTINE W REFLEX MICROSCOPIC - Abnormal; Notable for the following components:   Color, Urine AMBER (*)    APPearance CLOUDY (*)    Hgb urine dipstick MODERATE (*)    Protein, ur 100 (*)    Leukocytes,Ua MODERATE (*)    Bacteria, UA FEW (*)    All other components within normal limits  CBG MONITORING, ED - Abnormal; Notable for the following components:   Glucose-Capillary 213 (*)    All other components within normal limits  CULTURE, BLOOD (ROUTINE X 2)  CULTURE, BLOOD (ROUTINE X 2)  URINE CULTURE  SARS CORONAVIRUS 2 (TAT 6-24 HRS)  LACTIC ACID, PLASMA  POC SARS CORONAVIRUS 2 AG -  ED    EKG EKG Interpretation  Date/Time:  Sunday September 09 2019 17:02:35 EST Ventricular Rate:  108 PR Interval:    QRS Duration: 76 QT Interval:  322 QTC Calculation: 432 R Axis:   79 Text Interpretation: Sinus tachycardia Baseline wander in lead(s) V1 No old tracing to compare Confirmed by Sherwood Gambler [phone]) on 09/09/2019 5:38:20 PM   Radiology DG Chest Port 1 View  Result Date: 09/09/2019 CLINICAL DATA:  Generalized weakness and malaise. Back pain for 3 days. EXAM: PORTABLE CHEST 1 VIEW COMPARISON:  One-view chest x-ray 07/27/2016 FINDINGS: The heart size is normal. Atherosclerotic changes are noted at the aortic arch. Mild bibasilar airspace opacities are present, left greater than right. Upper lung fields are clear. Small left effusion is suspected. IMPRESSION: 1. Mild bibasilar airspace disease, left greater than right. While this likely reflects atelectasis, infection is not  excluded. 2. Probable small left pleural effusion. Electronically Signed   By: San Morelle M.D.   On: 09/09/2019 17:18   CT Renal Stone Study  Result Date: 09/09/2019 CLINICAL DATA:  Back and flank pain laterality not specified, question kidney stone disease EXAM: CT ABDOMEN AND PELVIS WITHOUT CONTRAST TECHNIQUE: Multidetector CT imaging of the abdomen and pelvis was performed following the standard protocol without IV contrast. Sagittal and coronal MPR images reconstructed from axial data set. No oral contrast administered. COMPARISON:  03/21/2018 FINDINGS: Lower chest: Bibasilar atelectasis versus infiltrate in lower lobes. Hepatobiliary: Gallbladder and liver normal appearance Pancreas: Normal appearance Spleen: Normal appearance Adrenals/Urinary Tract: Adrenal glands and RIGHT kidney normal appearance. LEFT hydronephrosis and hydroureter with perinephric edema. No definite ureteral calcification visualized. Single small focus of gas at upper pole of LEFT kidney. No renal masses or calculi identified. RIGHT ureter decompressed. Bladder unremarkable; no air in bladder. Stomach/Bowel: Stomach decompressed. Large and small bowel loops unremarkable. Low lying cecum in pelvis. Appendix not visualized. Vascular/Lymphatic: Few pelvic phleboliths. Atherosclerotic calcifications aorta without aneurysm. Prominent LEFT para-aortic lymph nodes at the level of the kidney, largest 11 mm short axis image 34. Reproductive: Unremarkable uterus and ovaries Other: No free air or free fluid.  No hernia. Musculoskeletal: Osseous demineralization. IMPRESSION: LEFT hydronephrosis, hydroureter, and perinephric edema though no definite ureteral calculus is visualized. This could be due to a passed renal calculus. However, a single focus of gas is identified at the upper pole of the LEFT kidney, raising question that these findings could be due to emphysematous pyelonephritis rather than stone disease; recommend correlation  with urinalysis. Repeat CT imaging could also be performed with IV contrast if urinalysis is nondiagnostic, which would allow for detection of ureteral enhancement and the typical patchy renal parenchymal enhancement of pyelonephritis. Findings called to Delia Heady PA on 09/09/2019 at 1843 hours. Electronically Signed   By: Lavonia Dana  M.D.   On: 09/09/2019 18:47    Procedures .Critical Care Performed by: Delia Heady, PA-C Authorized by: Delia Heady, PA-C   Critical care provider statement:    Critical care time (minutes):  35   Critical care was necessary to treat or prevent imminent or life-threatening deterioration of the following conditions:  Circulatory failure, sepsis, respiratory failure, renal failure, endocrine crisis and cardiac failure   Critical care was time spent personally by me on the following activities:  Development of treatment plan with patient or surrogate, discussions with consultants, discussions with primary provider, evaluation of patient's response to treatment, examination of patient, review of old charts, re-evaluation of patient's condition, pulse oximetry, ordering and review of radiographic studies, ordering and review of laboratory studies, ordering and performing treatments and interventions and obtaining history from patient or surrogate   I assumed direction of critical care for this patient from another provider in my specialty: no     (including critical care time)  Medications Ordered in ED Medications  0.9 %  sodium chloride infusion (has no administration in time range)  sodium chloride 0.9 % bolus 1,000 mL (has no administration in time range)  sodium chloride 0.9 % bolus 1,000 mL (0 mLs Intravenous Stopped 09/09/19 1835)  acetaminophen (TYLENOL) tablet 650 mg (650 mg Oral Given 09/09/19 1655)  meropenem (MERREM) 1 g in sodium chloride 0.9 % 100 mL IVPB (0 g Intravenous Stopped 09/09/19 1832)  sodium chloride 0.9 % bolus 1,000 mL (0 mLs Intravenous  Stopped 09/09/19 2046)  vancomycin (VANCOCIN) IVPB 1000 mg/200 mL premix (0 mg Intravenous Stopped 09/09/19 2136)  lactated ringers bolus 1,000 mL (0 mLs Intravenous Stopped 09/09/19 2259)    ED Course  I have reviewed the triage vital signs and the nursing notes.  Pertinent labs & imaging results that were available during my care of the patient were reviewed by me and considered in my medical decision making (see chart for details).  68 year old female with past medical history of ESBL pyelonephritis causing sepsis 2 years ago presents to ED for right-sided flank pain, fever.  Son states her symptoms have been going on for the past 3 days.  Patient febrile here and tachycardic on arrival.  Normal blood pressures.  She appears overall well, denies pain at this time.  Chart review shows that her prior pyelonephritis, UTI was ESBL and started on meropenem.  Patient started on same today along with IV fluids.  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  Lactic is gradually improved with IV fluids.  Unfortunately patient became hypotensive despite IV fluids.  Patient will need to be admitted for further management.  Clinical Course as of Sep 08 2318  Sun Sep 09, 2019  1653 Temp(!): 102 F (38.9 C) [HK]  1653 Pulse Rate(!): 132 [HK]  1911 Given 2L IV fluids.  Lactic Acid, Venous(!!): 3.1 [HK]  1911 Negative.  POC SARS Coronavirus 2 Ag-ED - Nasal Swab (BD Veritor Kit) [HK]  1913 I have ordered meropenem due to her sepsis and history of ESBL pyelonephritis. Added vancomycin. I spoke to pharmacist Stanton Kidney who is in agreement.   [HK]  1918 Hypotensive despite IV fluids. Critical care consulted by Dr. Regenia Skeeter.  I have tried to contact patient's son for update without success.   [HK]  2104 Spoke to Dr. Alyson Ingles, urologist.  States that there is no further recommendations from urology standpoint and that these findings should improve with IV antibiotics.   [HK]  2241  Critical care to  see.   [HK]  2249 Critical care in to see patient. We have both updated family.   [HK]  2250 Spoke to Dr. Deatra Canter of critical care.  He asked that we give patient another liter of fluids (this will be her 4th liter) as he believes this is all due to volume depletion.  States that we should reassess after fourth liter and consult him if necessary if she is still hypotensive with MAP <65.   [HK]  2318 BP improved to 100/57 before beginning 4th liter.   [HK]  2319 Lactic Acid, Venous: 1.7 [HK]    Clinical Course User Index [HK] Delia Heady, PA-C   MDM Rules/Calculators/A&P                      Final Clinical Impression(s) / ED Diagnoses Final diagnoses:  Septic shock [facility])  Pyelonephritis    Rx / DC Orders ED Discharge Orders    None        Delia Heady, PA-C 09/09/19 2319    Sherwood Gambler, MD 09/09/19 2329

## 2019-09-09 NOTE — ED Notes (Signed)
Please call Deatra Canter (son) [phone] with update or with questions.

## 2019-09-09 NOTE — ED Notes (Signed)
Date and time results received: 09/09/19 1805 (use smartphrase ".now" to insert current time)  Test: lactic Critical Value: 3.1  Name of Provider Notified: Regenia Skeeter  Orders Received? Or Actions Taken?: Orders Received - See Orders for details

## 2019-09-09 NOTE — ED Notes (Signed)
Pt is aware that a urine sample is needed.  

## 2019-09-09 NOTE — ED Triage Notes (Signed)
Pt BIBA from home. Pt c/o generalized weakness and malaise. Pt c/o back pain x 3 days. Pt has not been eating as much as normal.   Denies cough, N/V/D.

## 2019-09-09 NOTE — Progress Notes (Addendum)
A consult was received from an ED provider for meropenem per pharmacy dosing.  The patient's profile has been reviewed for ht/wt/allergies/indication/available labs.    Pt has history of ESBL E.coli UTI  A one time order has been placed for meropenem 1 g IV once.    Further antibiotics/pharmacy consults should be ordered by admitting physician if indicated.                       Thank you, Lenis Noon, PharmD 09/09/2019  4:55 PM  Pharmacy received additional consult from ED provider for vancomycin. Ht/wt ordered entered, but no recent weight available in chart. Will order vancomycin 1000 mg IV once.   Further antibiotics/pharmacy consults should be ordered by admitting physician if indicated.     Lenis Noon, PharmD 09/09/19 7:36 PM

## 2019-09-10 ENCOUNTER — Other Ambulatory Visit: Payer: Self-pay

## 2019-09-10 ENCOUNTER — Encounter (HOSPITAL_COMMUNITY): Payer: Self-pay | Admitting: Internal Medicine

## 2019-09-10 ENCOUNTER — Inpatient Hospital Stay (HOSPITAL_COMMUNITY): Payer: BC Managed Care – PPO

## 2019-09-10 DIAGNOSIS — Z7982 Long term (current) use of aspirin: Secondary | ICD-10-CM | POA: Diagnosis not present

## 2019-09-10 DIAGNOSIS — Z794 Long term (current) use of insulin: Secondary | ICD-10-CM | POA: Diagnosis not present

## 2019-09-10 DIAGNOSIS — R6521 Severe sepsis with septic shock: Secondary | ICD-10-CM | POA: Diagnosis present

## 2019-09-10 DIAGNOSIS — N12 Tubulo-interstitial nephritis, not specified as acute or chronic: Secondary | ICD-10-CM | POA: Diagnosis present

## 2019-09-10 DIAGNOSIS — E872 Acidosis: Secondary | ICD-10-CM | POA: Diagnosis present

## 2019-09-10 DIAGNOSIS — I5032 Chronic diastolic (congestive) heart failure: Secondary | ICD-10-CM | POA: Diagnosis present

## 2019-09-10 DIAGNOSIS — Z87440 Personal history of urinary (tract) infections: Secondary | ICD-10-CM | POA: Diagnosis not present

## 2019-09-10 DIAGNOSIS — N136 Pyonephrosis: Secondary | ICD-10-CM | POA: Diagnosis present

## 2019-09-10 DIAGNOSIS — N179 Acute kidney failure, unspecified: Secondary | ICD-10-CM | POA: Diagnosis present

## 2019-09-10 DIAGNOSIS — N390 Urinary tract infection, site not specified: Secondary | ICD-10-CM | POA: Diagnosis not present

## 2019-09-10 DIAGNOSIS — E785 Hyperlipidemia, unspecified: Secondary | ICD-10-CM | POA: Diagnosis present

## 2019-09-10 DIAGNOSIS — I340 Nonrheumatic mitral (valve) insufficiency: Secondary | ICD-10-CM | POA: Diagnosis not present

## 2019-09-10 DIAGNOSIS — Z20822 Contact with and (suspected) exposure to covid-19: Secondary | ICD-10-CM | POA: Diagnosis present

## 2019-09-10 DIAGNOSIS — A4151 Sepsis due to Escherichia coli [E. coli]: Secondary | ICD-10-CM | POA: Diagnosis present

## 2019-09-10 DIAGNOSIS — A499 Bacterial infection, unspecified: Secondary | ICD-10-CM | POA: Diagnosis not present

## 2019-09-10 DIAGNOSIS — E1129 Type 2 diabetes mellitus with other diabetic kidney complication: Secondary | ICD-10-CM | POA: Diagnosis not present

## 2019-09-10 DIAGNOSIS — E871 Hypo-osmolality and hyponatremia: Secondary | ICD-10-CM | POA: Diagnosis present

## 2019-09-10 DIAGNOSIS — E1100 Type 2 diabetes mellitus with hyperosmolarity without nonketotic hyperglycemic-hyperosmolar coma (NKHHC): Secondary | ICD-10-CM | POA: Diagnosis not present

## 2019-09-10 DIAGNOSIS — J9601 Acute respiratory failure with hypoxia: Secondary | ICD-10-CM | POA: Diagnosis present

## 2019-09-10 DIAGNOSIS — Z7984 Long term (current) use of oral hypoglycemic drugs: Secondary | ICD-10-CM | POA: Diagnosis not present

## 2019-09-10 DIAGNOSIS — R34 Anuria and oliguria: Secondary | ICD-10-CM | POA: Diagnosis present

## 2019-09-10 DIAGNOSIS — R451 Restlessness and agitation: Secondary | ICD-10-CM | POA: Diagnosis not present

## 2019-09-10 DIAGNOSIS — D61818 Other pancytopenia: Secondary | ICD-10-CM | POA: Diagnosis present

## 2019-09-10 DIAGNOSIS — R809 Proteinuria, unspecified: Secondary | ICD-10-CM | POA: Diagnosis not present

## 2019-09-10 DIAGNOSIS — E876 Hypokalemia: Secondary | ICD-10-CM | POA: Diagnosis present

## 2019-09-10 DIAGNOSIS — A419 Sepsis, unspecified organism: Secondary | ICD-10-CM | POA: Diagnosis not present

## 2019-09-10 DIAGNOSIS — Z1612 Extended spectrum beta lactamase (ESBL) resistance: Secondary | ICD-10-CM | POA: Diagnosis not present

## 2019-09-10 DIAGNOSIS — F419 Anxiety disorder, unspecified: Secondary | ICD-10-CM | POA: Diagnosis present

## 2019-09-10 DIAGNOSIS — E119 Type 2 diabetes mellitus without complications: Secondary | ICD-10-CM | POA: Diagnosis present

## 2019-09-10 DIAGNOSIS — Z781 Physical restraint status: Secondary | ICD-10-CM | POA: Diagnosis not present

## 2019-09-10 HISTORY — PX: IR NEPHROSTOMY PLACEMENT LEFT: IMG6063

## 2019-09-10 LAB — BASIC METABOLIC PANEL
Anion gap: 11 (ref 5–15)
Anion gap: 13 (ref 5–15)
BUN: 32 mg/dL — ABNORMAL HIGH (ref 8–23)
BUN: 34 mg/dL — ABNORMAL HIGH (ref 8–23)
CO2: 14 mmol/L — ABNORMAL LOW (ref 22–32)
CO2: 14 mmol/L — ABNORMAL LOW (ref 22–32)
Calcium: 7 mg/dL — ABNORMAL LOW (ref 8.9–10.3)
Calcium: 6.7 mg/dL — ABNORMAL LOW (ref 8.9–10.3)
Chloride: 107 mmol/L (ref 98–111)
Chloride: 108 mmol/L (ref 98–111)
Creatinine, Ser: 2.3 mg/dL — ABNORMAL HIGH (ref 0.44–1.00)
Creatinine, Ser: 2.54 mg/dL — ABNORMAL HIGH (ref 0.44–1.00)
GFR calc Af Amer: 24 mL/min — ABNORMAL LOW (ref >60)
GFR calc Af Amer: 22 mL/min — ABNORMAL LOW (ref >60)
GFR calc non Af Amer: 21 mL/min — ABNORMAL LOW (ref >60)
GFR calc non Af Amer: 19 mL/min — ABNORMAL LOW (ref >60)
Glucose, Bld: 193 mg/dL — ABNORMAL HIGH (ref 70–99)
Glucose, Bld: 166 mg/dL — ABNORMAL HIGH (ref 70–99)
Potassium: 4.2 mmol/L (ref 3.5–5.1)
Potassium: 3.6 mmol/L (ref 3.5–5.1)
Sodium: 132 mmol/L — ABNORMAL LOW (ref 135–145)
Sodium: 135 mmol/L (ref 135–145)

## 2019-09-10 LAB — URINE CULTURE: Culture: <10000 — AB

## 2019-09-10 LAB — CBG MONITORING, ED
Glucose-Capillary: 186 mg/dL — ABNORMAL HIGH (ref 70–99)
Glucose-Capillary: 157 mg/dL — ABNORMAL HIGH (ref 70–99)

## 2019-09-10 LAB — TROPONIN I (HIGH SENSITIVITY)
Troponin I (High Sensitivity): 1010 ng/L (ref <18)
Troponin I (High Sensitivity): 1029 ng/L (ref <18)
Troponin I (High Sensitivity): 1163 ng/L (ref <18)
Troponin I (High Sensitivity): 1200 ng/L (ref <18)

## 2019-09-10 LAB — BLOOD GAS, VENOUS
Acid-Base Excess: 10.9 mmol/L — ABNORMAL HIGH (ref 0.0–2.0)
Bicarbonate: 14.8 mmol/L — ABNORMAL LOW (ref 20.0–28.0)
O2 Saturation: 78.2 %
Patient temperature: 98.6
pCO2, Ven: 34.1 mmHg — ABNORMAL LOW (ref 44.0–60.0)
pH, Ven: 7.261 (ref 7.250–7.430)
pO2, Ven: 45.2 mmHg — ABNORMAL HIGH (ref 32.0–45.0)

## 2019-09-10 LAB — HEMOGLOBIN A1C
Hgb A1c MFr Bld: 7.6 % — ABNORMAL HIGH (ref 4.8–5.6)
Mean Plasma Glucose: 171.42 mg/dL

## 2019-09-10 LAB — PHOSPHORUS
Phosphorus: 2 mg/dL — ABNORMAL LOW (ref 2.5–4.6)
Phosphorus: 3.9 mg/dL (ref 2.5–4.6)

## 2019-09-10 LAB — CBC
HCT: 27.7 % — ABNORMAL LOW (ref 36.0–46.0)
HCT: 23.2 % — ABNORMAL LOW (ref 36.0–46.0)
Hemoglobin: 9.2 g/dL — ABNORMAL LOW (ref 12.0–15.0)
Hemoglobin: 7.5 g/dL — ABNORMAL LOW (ref 12.0–15.0)
MCH: 29.9 pg (ref 26.0–34.0)
MCH: 29 pg (ref 26.0–34.0)
MCHC: 33.2 g/dL (ref 30.0–36.0)
MCHC: 32.3 g/dL (ref 30.0–36.0)
MCV: 89.9 fL (ref 80.0–100.0)
MCV: 89.6 fL (ref 80.0–100.0)
Platelets: 122 10*3/uL — ABNORMAL LOW (ref 150–400)
Platelets: 115 10*3/uL — ABNORMAL LOW (ref 150–400)
RBC: 3.08 MIL/uL — ABNORMAL LOW (ref 3.87–5.11)
RBC: 2.59 MIL/uL — ABNORMAL LOW (ref 3.87–5.11)
RDW: 14.2 % (ref 11.5–15.5)
RDW: 14.6 % (ref 11.5–15.5)
WBC: 2.6 10*3/uL — ABNORMAL LOW (ref 4.0–10.5)
WBC: 14.8 10*3/uL — ABNORMAL HIGH (ref 4.0–10.5)
nRBC: 0 % (ref 0.0–0.2)
nRBC: 0.2 % (ref 0.0–0.2)

## 2019-09-10 LAB — LACTIC ACID, PLASMA
Lactic Acid, Venous: 4.1 mmol/L (ref 0.5–1.9)
Lactic Acid, Venous: 3.2 mmol/L (ref 0.5–1.9)

## 2019-09-10 LAB — BLOOD GAS, ARTERIAL
Acid-base deficit: 14.8 mmol/L — ABNORMAL HIGH (ref 0.0–2.0)
Acid-base deficit: 12.2 mmol/L — ABNORMAL HIGH (ref 0.0–2.0)
Bicarbonate: 11.6 mmol/L — ABNORMAL LOW (ref 20.0–28.0)
Bicarbonate: 12.9 mmol/L — ABNORMAL LOW (ref 20.0–28.0)
Drawn by: 51425
Drawn by: 259031
FIO2: 0.5
FIO2: 60
MECHVT: 450 mL
MECHVT: 450 mL
O2 Saturation: 89.6 %
O2 Saturation: 88.2 %
PEEP: 5 cmH2O
PEEP: 5 cmH2O
Patient temperature: 101.3
Patient temperature: 98.6
RATE: 22 resp/min
RATE: 18 resp/min
pCO2 arterial: 32.5 mmHg (ref 32.0–48.0)
pCO2 arterial: 28 mmHg — ABNORMAL LOW (ref 32.0–48.0)
pH, Arterial: 7.189 — CL (ref 7.350–7.450)
pH, Arterial: 7.286 — ABNORMAL LOW (ref 7.350–7.450)
pO2, Arterial: 77.3 mmHg — ABNORMAL LOW (ref 83.0–108.0)
pO2, Arterial: 60.2 mmHg — ABNORMAL LOW (ref 83.0–108.0)

## 2019-09-10 LAB — PROCALCITONIN: Procalcitonin: >150 ng/mL

## 2019-09-10 LAB — MAGNESIUM
Magnesium: 1.3 mg/dL — ABNORMAL LOW (ref 1.7–2.4)
Magnesium: 2.6 mg/dL — ABNORMAL HIGH (ref 1.7–2.4)

## 2019-09-10 LAB — GLUCOSE, CAPILLARY
Glucose-Capillary: 147 mg/dL — ABNORMAL HIGH (ref 70–99)
Glucose-Capillary: 156 mg/dL — ABNORMAL HIGH (ref 70–99)
Glucose-Capillary: 160 mg/dL — ABNORMAL HIGH (ref 70–99)
Glucose-Capillary: 180 mg/dL — ABNORMAL HIGH (ref 70–99)

## 2019-09-10 LAB — ECHOCARDIOGRAM COMPLETE
Height: 65 in
Weight: 1904 oz

## 2019-09-10 LAB — CORTISOL: Cortisol, Plasma: 23.4 ug/dL

## 2019-09-10 LAB — SARS CORONAVIRUS 2 (TAT 6-24 HRS): SARS Coronavirus 2: NEGATIVE

## 2019-09-10 LAB — HIV ANTIBODY (ROUTINE TESTING W REFLEX): HIV Screen 4th Generation wRfx: NONREACTIVE

## 2019-09-10 MED ORDER — ACETAMINOPHEN 650 MG RE SUPP
650.0000 mg | Freq: Four times a day (QID) | RECTAL | Status: DC | PRN
  Administered 2019-09-10: 650 mg via RECTAL
  Filled 2019-09-10: qty 1

## 2019-09-10 MED ORDER — STERILE WATER FOR INJECTION IV SOLN
INTRAVENOUS | Status: AC
  Filled 2019-09-10 (×7): qty 850

## 2019-09-10 MED ORDER — NOREPINEPHRINE 4 MG/250ML-% IV SOLN
0.0000 ug/min | INTRAVENOUS | Status: DC
  Administered 2019-09-10: 40 ug/min via INTRAVENOUS
  Administered 2019-09-10: 5 ug/min via INTRAVENOUS
  Administered 2019-09-10: 40 ug/min via INTRAVENOUS
  Filled 2019-09-10 (×5): qty 250

## 2019-09-10 MED ORDER — ONDANSETRON HCL 4 MG/2ML IJ SOLN: 4.0000 mg | Freq: Four times a day (QID) | INTRAMUSCULAR | Status: DC | PRN

## 2019-09-10 MED ORDER — LIDOCAINE-EPINEPHRINE (PF) 2 %-1:200000 IJ SOLN
INTRAMUSCULAR | Status: AC
  Filled 2019-09-10: qty 20

## 2019-09-10 MED ORDER — PRO-STAT SUGAR FREE PO LIQD
30.0000 mL | Freq: Two times a day (BID) | ORAL | Status: DC
  Administered 2019-09-10 – 2019-09-11 (×3): 30 mL
  Filled 2019-09-10: qty 30

## 2019-09-10 MED ORDER — HEPARIN SODIUM (PORCINE) 5000 UNIT/ML IJ SOLN: 5000.0000 [IU] | Freq: Once | INTRAMUSCULAR | Status: DC

## 2019-09-10 MED ORDER — FENTANYL 2500MCG IN NS 250ML (10MCG/ML) PREMIX INFUSION
25.0000 ug/h | INTRAVENOUS | Status: DC
  Administered 2019-09-10 – 2019-09-12 (×2): 50 ug/h via INTRAVENOUS
  Filled 2019-09-10: qty 250

## 2019-09-10 MED ORDER — VASOPRESSIN 20 UNIT/ML IV SOLN
0.0300 [IU]/min | INTRAVENOUS | Status: DC
  Administered 2019-09-10 – 2019-09-11 (×2): 0.03 [IU]/min via INTRAVENOUS
  Filled 2019-09-10 (×2): qty 2

## 2019-09-10 MED ORDER — VITAL HIGH PROTEIN PO LIQD
1000.0000 mL | ORAL | Status: DC
  Administered 2019-09-10: 1000 mL

## 2019-09-10 MED ORDER — FENTANYL CITRATE (PF) 100 MCG/2ML IJ SOLN
100.0000 ug | Freq: Once | INTRAMUSCULAR | Status: AC
  Administered 2019-09-10: 50 ug via INTRAVENOUS

## 2019-09-10 MED ORDER — LIDOCAINE HCL 1 % IJ SOLN
INTRAMUSCULAR | Status: AC
  Filled 2019-09-10: qty 20

## 2019-09-10 MED ORDER — NOREPINEPHRINE 16 MG/250ML-% IV SOLN
0.0000 ug/min | INTRAVENOUS | Status: DC
  Administered 2019-09-10: 40 ug/min via INTRAVENOUS
  Administered 2019-09-10: 26 ug/min via INTRAVENOUS
  Administered 2019-09-13: 1 ug/min via INTRAVENOUS
  Filled 2019-09-10 (×3): qty 250

## 2019-09-10 MED ORDER — HEPARIN SODIUM (PORCINE) 5000 UNIT/ML IJ SOLN: 5000.0000 [IU] | Freq: Three times a day (TID) | INTRAMUSCULAR | Status: DC

## 2019-09-10 MED ORDER — SODIUM CHLORIDE 0.9% FLUSH: 10.0000 mL | INTRAVENOUS | Status: DC | PRN

## 2019-09-10 MED ORDER — SODIUM CHLORIDE 0.9% FLUSH
10.0000 mL | Freq: Two times a day (BID) | INTRAVENOUS | Status: DC
  Administered 2019-09-10 – 2019-09-12 (×4): 10 mL
  Administered 2019-09-12: 30 mL
  Administered 2019-09-13 – 2019-09-19 (×11): 10 mL

## 2019-09-10 MED ORDER — CHLORHEXIDINE GLUCONATE CLOTH 2 % EX PADS
6.0000 | MEDICATED_PAD | Freq: Every day | CUTANEOUS | Status: DC
  Administered 2019-09-10 – 2019-09-20 (×10): 6 via TOPICAL

## 2019-09-10 MED ORDER — MAGNESIUM SULFATE 4 GM/100ML IV SOLN
4.0000 g | Freq: Once | INTRAVENOUS | Status: AC
  Administered 2019-09-10: 4 g via INTRAVENOUS
  Filled 2019-09-10: qty 100

## 2019-09-10 MED ORDER — CHLORHEXIDINE GLUCONATE 0.12% ORAL RINSE (MEDLINE KIT)
15.0000 mL | Freq: Two times a day (BID) | OROMUCOSAL | Status: DC
  Administered 2019-09-10 – 2019-09-13 (×6): 15 mL via OROMUCOSAL

## 2019-09-10 MED ORDER — SODIUM CHLORIDE 0.9% FLUSH
3.0000 mL | Freq: Two times a day (BID) | INTRAVENOUS | Status: DC
  Administered 2019-09-10 – 2019-09-14 (×8): 3 mL via INTRAVENOUS

## 2019-09-10 MED ORDER — ACETAMINOPHEN 650 MG RE SUPP: 650.0000 mg | Freq: Four times a day (QID) | RECTAL | Status: DC | PRN

## 2019-09-10 MED ORDER — HYDROCORTISONE NA SUCCINATE PF 100 MG IJ SOLR
50.0000 mg | Freq: Four times a day (QID) | INTRAMUSCULAR | Status: AC
  Administered 2019-09-10 – 2019-09-14 (×18): 50 mg via INTRAVENOUS
  Filled 2019-09-10 (×18): qty 2

## 2019-09-10 MED ORDER — IOHEXOL 300 MG/ML  SOLN
50.0000 mL | Freq: Once | INTRAMUSCULAR | Status: AC | PRN
  Administered 2019-09-10: 15 mL

## 2019-09-10 MED ORDER — PROPOFOL 1000 MG/100ML IV EMUL
5.0000 ug/kg/min | INTRAVENOUS | Status: DC
  Administered 2019-09-10: 10 ug/kg/min via INTRAVENOUS
  Administered 2019-09-10: 46.296 ug/kg/min via INTRAVENOUS
  Filled 2019-09-10: qty 100

## 2019-09-10 MED ORDER — FENTANYL CITRATE (PF) 100 MCG/2ML IJ SOLN: 25.0000 ug | INTRAMUSCULAR | Status: DC | PRN

## 2019-09-10 MED ORDER — DEXMEDETOMIDINE HCL IN NACL 200 MCG/50ML IV SOLN
0.0000 ug/kg/h | INTRAVENOUS | Status: AC
  Administered 2019-09-10: 0.7 ug/kg/h via INTRAVENOUS
  Administered 2019-09-10 – 2019-09-11 (×2): 0.4 ug/kg/h via INTRAVENOUS
  Administered 2019-09-11 (×2): 0.7 ug/kg/h via INTRAVENOUS
  Administered 2019-09-12 (×3): 0.6 ug/kg/h via INTRAVENOUS
  Administered 2019-09-13: 0.5 ug/kg/h via INTRAVENOUS
  Administered 2019-09-13: 0.7 ug/kg/h via INTRAVENOUS
  Filled 2019-09-10 (×10): qty 50

## 2019-09-10 MED ORDER — FENTANYL BOLUS VIA INFUSION
25.0000 ug | INTRAVENOUS | Status: DC | PRN
  Filled 2019-09-10: qty 25

## 2019-09-10 MED ORDER — ACETAMINOPHEN 325 MG PO TABS
650.0000 mg | ORAL_TABLET | ORAL | Status: DC | PRN
  Administered 2019-09-17 – 2019-09-19 (×4): 650 mg via ORAL
  Filled 2019-09-10 (×4): qty 2

## 2019-09-10 MED ORDER — ACETAMINOPHEN 325 MG PO TABS: 650.0000 mg | ORAL_TABLET | Freq: Four times a day (QID) | ORAL | Status: DC | PRN

## 2019-09-10 MED ORDER — SODIUM CHLORIDE 0.9 % IV BOLUS
1000.0000 mL | Freq: Once | INTRAVENOUS | Status: AC
  Administered 2019-09-10: 1000 mL via INTRAVENOUS

## 2019-09-10 MED ORDER — FENTANYL CITRATE (PF) 100 MCG/2ML IJ SOLN
INTRAMUSCULAR | Status: AC
  Administered 2019-09-10: 50 ug
  Filled 2019-09-10: qty 2

## 2019-09-10 MED ORDER — INSULIN ASPART 100 UNIT/ML ~~LOC~~ SOLN
0.0000 [IU] | Freq: Three times a day (TID) | SUBCUTANEOUS | Status: DC
  Filled 2019-09-10: qty 0.09

## 2019-09-10 MED ORDER — FAMOTIDINE IN NACL 20-0.9 MG/50ML-% IV SOLN
20.0000 mg | Freq: Two times a day (BID) | INTRAVENOUS | Status: DC
  Administered 2019-09-10: 20 mg via INTRAVENOUS
  Filled 2019-09-10: qty 50

## 2019-09-10 MED ORDER — SODIUM CHLORIDE 0.9% FLUSH
5.0000 mL | Freq: Three times a day (TID) | INTRAVENOUS | Status: DC
  Administered 2019-09-10 – 2019-09-14 (×11): 5 mL

## 2019-09-10 MED ORDER — POTASSIUM CHLORIDE 10 MEQ/100ML IV SOLN
10.0000 meq | INTRAVENOUS | Status: DC
  Administered 2019-09-10: 10 meq via INTRAVENOUS
  Filled 2019-09-10: qty 100

## 2019-09-10 MED ORDER — ORAL CARE MOUTH RINSE
15.0000 mL | OROMUCOSAL | Status: DC
  Administered 2019-09-10 – 2019-09-13 (×32): 15 mL via OROMUCOSAL

## 2019-09-10 MED ORDER — INSULIN ASPART 100 UNIT/ML ~~LOC~~ SOLN
0.0000 [IU] | SUBCUTANEOUS | Status: DC
  Administered 2019-09-10 (×2): 2 [IU] via SUBCUTANEOUS
  Administered 2019-09-10 – 2019-09-11 (×2): 3 [IU] via SUBCUTANEOUS
  Administered 2019-09-11 (×2): 8 [IU] via SUBCUTANEOUS
  Administered 2019-09-11: 11 [IU] via SUBCUTANEOUS
  Administered 2019-09-11: 3 [IU] via SUBCUTANEOUS
  Administered 2019-09-11: 5 [IU] via SUBCUTANEOUS
  Administered 2019-09-12: 15 [IU] via SUBCUTANEOUS
  Administered 2019-09-12: 11 [IU] via SUBCUTANEOUS

## 2019-09-10 MED ORDER — DOCUSATE SODIUM 50 MG/5ML PO LIQD
100.0000 mg | Freq: Two times a day (BID) | ORAL | Status: DC | PRN
  Filled 2019-09-10: qty 10

## 2019-09-10 MED ORDER — SODIUM CHLORIDE 0.9 % IV SOLN: INTRAVENOUS | Status: DC

## 2019-09-10 MED ORDER — ALBUTEROL SULFATE (2.5 MG/3ML) 0.083% IN NEBU
2.5000 mg | INHALATION_SOLUTION | RESPIRATORY_TRACT | Status: DC | PRN
  Administered 2019-09-13: 2.5 mg via RESPIRATORY_TRACT
  Filled 2019-09-10: qty 3

## 2019-09-10 MED ORDER — SODIUM CHLORIDE 0.9 % IV SOLN
1.0000 g | Freq: Two times a day (BID) | INTRAVENOUS | Status: DC
  Administered 2019-09-10 – 2019-09-15 (×11): 1 g via INTRAVENOUS
  Filled 2019-09-10 (×12): qty 1

## 2019-09-10 MED ORDER — FENTANYL BOLUS VIA INFUSION
25.0000 ug | INTRAVENOUS | Status: DC | PRN
  Administered 2019-09-10 – 2019-09-13 (×4): 25 ug via INTRAVENOUS
  Filled 2019-09-10: qty 25

## 2019-09-10 MED ORDER — SODIUM BICARBONATE 8.4 % IV SOLN
INTRAVENOUS | Status: AC
  Filled 2019-09-10: qty 100

## 2019-09-10 MED ORDER — BISACODYL 10 MG RE SUPP: 10.0000 mg | Freq: Every day | RECTAL | Status: DC | PRN

## 2019-09-10 MED ORDER — ATORVASTATIN CALCIUM 20 MG PO TABS
20.0000 mg | ORAL_TABLET | Freq: Every day | ORAL | Status: DC
  Administered 2019-09-10 – 2019-09-19 (×8): 20 mg via ORAL
  Filled 2019-09-10 (×2): qty 2
  Filled 2019-09-10: qty 1
  Filled 2019-09-10 (×2): qty 2
  Filled 2019-09-10: qty 1
  Filled 2019-09-10: qty 2
  Filled 2019-09-10: qty 1
  Filled 2019-09-10: qty 2

## 2019-09-10 MED ORDER — FENTANYL 2500MCG IN NS 250ML (10MCG/ML) PREMIX INFUSION
25.0000 ug/h | INTRAVENOUS | Status: DC
  Administered 2019-09-10: 25 ug/h via INTRAVENOUS
  Filled 2019-09-10: qty 250

## 2019-09-10 MED ORDER — FAMOTIDINE IN NACL 20-0.9 MG/50ML-% IV SOLN
20.0000 mg | INTRAVENOUS | Status: DC
  Administered 2019-09-11 – 2019-09-17 (×7): 20 mg via INTRAVENOUS
  Filled 2019-09-10 (×8): qty 50

## 2019-09-10 MED ORDER — ONDANSETRON HCL 4 MG PO TABS: 4.0000 mg | ORAL_TABLET | Freq: Four times a day (QID) | ORAL | Status: DC | PRN

## 2019-09-10 MED ORDER — PROPOFOL 1000 MG/100ML IV EMUL
INTRAVENOUS | Status: AC
  Filled 2019-09-10: qty 100

## 2019-09-10 MED ORDER — SODIUM BICARBONATE 8.4 % IV SOLN
100.0000 meq | Freq: Once | INTRAVENOUS | Status: AC
  Administered 2019-09-10: 100 meq via INTRAVENOUS

## 2019-09-10 MED ORDER — HEPARIN SODIUM (PORCINE) 5000 UNIT/ML IJ SOLN
5000.0000 [IU] | Freq: Three times a day (TID) | INTRAMUSCULAR | Status: DC
  Administered 2019-09-10 – 2019-09-11 (×3): 5000 [IU] via SUBCUTANEOUS
  Filled 2019-09-10 (×3): qty 1

## 2019-09-10 NOTE — Plan of Care (Signed)
CXR shows left IJ CVC in appropriate place No PTX Ok to use line for pressors and other drugs.

## 2019-09-10 NOTE — ED Notes (Addendum)
Date and time results received: 09/10/19 0646 (use smartphrase ".now" to insert current time)  Test: Troponin Critical Value:1010  Name of Provider Notified: Deatra Canter Md  Orders Received? Or Actions Taken?: Waiting on orders

## 2019-09-10 NOTE — Procedures (Signed)
Central Venous Catheter Insertion Procedure Note Gerard Bonus [id number] Apr 23, 1951  Procedure: Insertion of Central Venous Catheter Indications: Drug and/or fluid administration  Procedure Details Consent: Risks of procedure as well as the alternatives and risks of each were explained to the (patient/caregiver).  Consent for procedure obtained.   Consent obtained from son and daughter over a conference call, explained potential complications including infection, PTX, need for chest tube  Time Out: Verified patient identification, verified procedure, site/side was marked, verified correct patient position, special equipment/implants available, medications/allergies/relevent history reviewed, required imaging and test results available.  Performed  Maximum sterile technique was used including antiseptics, cap, gloves, gown, hand hygiene, mask and sheet. Skin prep: Chlorhexidine; local anesthetic administered A antimicrobial bonded/coated triple lumen catheter was placed in the left internal jugular vein using the Seldinger technique.  Evaluation Blood flow good Complications: No apparent complications Patient did tolerate procedure well. Chest X-ray ordered to verify placement.  CXR: pending.  Tally Due 09/10/2019, 6:25 AM

## 2019-09-10 NOTE — Progress Notes (Signed)
Lab call ed with critical ABG values.  PH. 7.189, Co2 32.5 Hco3 11.6  Critical care called and and given results.  Awaiting return call for orders.

## 2019-09-10 NOTE — Consult Note (Signed)
Urology Consult  Referring physician: Dr. 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 She was initially doing well on presentation to the ER but she rapidly declined and required intubation and vasopressors. She was having anusea prior to intubationShe underwent CT which showed mild to moderate hydronephrosis with gas in the collecting system and a possible left ureteral calculus on the revised CT read.   Past Medical History:  Diagnosis Date   Acute pyelonephritis 03/21/2017   High cholesterol    Renal insufficiency 03/21/2017   Type II diabetes mellitus ([place])    Past Surgical History:  Procedure Laterality Date   NO PAST SURGERIES      Medications: I have reviewed the patient's current medications. Allergies: No Known Allergies  History reviewed. No pertinent family history. Social History:  reports that she has never smoked. She has never used smokeless tobacco. She reports that she does not drink alcohol or use drugs.  Review of Systems  Unable to perform ROS: Intubated    Physical Exam:  Vital signs in last 24 hours: Temp:  [98.5 F (36.9 C)-102.2 F (39 C)] 102 F (38.9 C) (12/28 0730) Pulse Rate:  [97-134] 116 (12/28 0730) Resp:  [10-39] 25 (12/28 0730) BP: (64-208)/(41-133) 85/50 (12/28 0730) SpO2:  [90 %-100 %] 98 % (12/28 0731) FiO2 (%):  [50 %] 50 % (12/28 0731) Weight:  [54 kg] 54 kg (12/28 0200) Physical Exam  Constitutional: She appears well-developed and well-nourished.  HENT:  Head: Normocephalic and atraumatic.  Neck: No thyromegaly present.  Cardiovascular: Tachycardia present.  Respiratory: Effort normal. No respiratory distress.  GI: Soft. She exhibits no distension.  Musculoskeletal:        General: No edema. Normal  range of motion.     Cervical back: Normal range of motion.  Skin: Skin is warm and dry.    Laboratory Data:  Results for orders placed or performed during the hospital encounter of 09/09/19 (from the past 72 hour(s))  CBG monitoring, ED     Status: Abnormal   Collection Time: 09/09/19  4:02 PM  Result Value Ref Range   Glucose-Capillary 213 (H) 70 - 99 mg/dL   Comment 1 Notify RN   Comprehensive metabolic panel     Status: Abnormal   Collection Time: 09/09/19  4:39 PM  Result Value Ref Range   Sodium 131 (L) 135 - 145 mmol/L   Potassium 3.3 (L) 3.5 - 5.1 mmol/L   Chloride 101 98 - 111 mmol/L   CO2 19 (L) 22 - 32 mmol/L   Glucose, Bld 205 (H) 70 - 99 mg/dL   BUN 31 (H) 8 - 23 mg/dL   Creatinine, Ser 2.14 (H) 0.44 - 1.00 mg/dL   Calcium 8.6 (L) 8.9 - 10.3 mg/dL   Total Protein 7.3 6.5 - 8.1 g/dL   Albumin 3.4 (L) 3.5 - 5.0 g/dL   AST 19 15 - 41 U/L   ALT 13 0 - 44 U/L   Alkaline Phosphatase 138 (H) 38 - 126 U/L   Total Bilirubin 0.9 0.3 - 1.2 mg/dL   GFR calc non Af Amer 23 (L) >60 mL/min   GFR calc Af Amer 27 (L) >60 mL/min   Anion gap 11 5 - 15    Comment: Performed at [facility], [address], [place], [place] [address]  Lactic acid, plasma     Status: Abnormal   Collection Time: 09/09/19  4:39 PM  Result Value Ref Range   Lactic Acid, Venous 3.1 (HH) 0.5 - 1.9 mmol/L    Comment: CRITICAL RESULT CALLED TO, READ BACK BY AND VERIFIED WITH: Z.TETTERS,RN [id number]  BY V.WILKINS Performed at [facility], [address], [place], [place] [address]   CBC with Differential     Status: Abnormal   Collection Time: 09/09/19  4:39 PM  Result Value Ref Range   WBC 6.2 4.0 - 10.5 K/uL   RBC 3.38 (L) 3.87 - 5.11 MIL/uL   Hemoglobin 9.6 (L) 12.0 - 15.0 g/dL   HCT 69.6 (L) 29.5 - 28.4 %   MCV 87.3 80.0 - 100.0 fL   MCH 28.4 26.0 - 34.0 pg   MCHC 32.5 30.0 - 36.0 g/dL   RDW 13.2 44.0 - 10.2 %   Platelets 200 150 - 400 K/uL   nRBC 0.0  0.0 - 0.2 %   Neutrophils Relative % 92 %   Neutro Abs 5.8 1.7 - 7.7 K/uL   Lymphocytes Relative 6 %   Lymphs Abs 0.4 (L) 0.7 - 4.0 K/uL   Monocytes Relative 1 %   Monocytes Absolute 0.1 0.1 - 1.0 K/uL   Eosinophils Relative 0 %   Eosinophils Absolute 0.0 0.0 - 0.5 K/uL   Basophils Relative 0 %   Basophils Absolute 0.0 0.0 - 0.1 K/uL   WBC Morphology VACUOLATED NEUTROPHILS    Immature Granulocytes 1 %   Abs Immature Granulocytes 0.03 0.00 - 0.07 K/uL    Comment: Performed at [facility], [address] [place], [place] [address]  Protime-INR     Status: Abnormal   Collection Time: 09/09/19  4:39 PM  Result Value Ref Range   Prothrombin Time 17.6 (H) 11.4 - 15.2 seconds   INR 1.5 (H) 0.8 - 1.2    Comment: (NOTE) INR goal varies based on device and disease states. Performed at [facility], [address], [place], [place] [address]   SARS CORONAVIRUS 2 (TAT 6-24 HRS) Nasopharyngeal Nasopharyngeal Swab     Status: None   Collection Time: 09/09/19  6:45 PM   Specimen: Nasopharyngeal Swab  Result Value Ref Range   SARS Coronavirus 2 NEGATIVE NEGATIVE    Comment: (NOTE) SARS-CoV-2 target nucleic acids are NOT DETECTED. The SARS-CoV-2 RNA is generally detectable in upper and lower respiratory specimens during the acute phase of infection. Negative results do not preclude SARS-CoV-2 infection, do not rule out co-infections with other pathogens, and should not be used as the sole basis for treatment or other patient management decisions. Negative results must be combined with clinical observations, patient history, and epidemiological information. The expected result is Negative. Fact Sheet for Patients: HairSlick.no Fact Sheet for Healthcare Providers: quierodirigir.com This test is not yet approved or cleared by the [place] FDA and  has been authorized for detection and/or  diagnosis of SARS-CoV-2 by FDA under an Emergency Use Authorization (EUA). This EUA will remain  in effect (meaning this test can be used) for the duration of the COVID-19 declaration under Section 56 4(b)(1) of the Act, 21 U.S.C. section 360bbb-3(b)(1), unless the authorization is terminated or revoked sooner. Performed at [facility], [address], [place], [place] [address]   Urinalysis, Routine w reflex microscopic     Status: Abnormal   Collection  Time: 09/09/19  7:00 PM  Result Value Ref Range   Color, Urine AMBER (A) YELLOW    Comment: BIOCHEMICALS MAY BE AFFECTED BY COLOR   APPearance CLOUDY (A) CLEAR   Specific Gravity, Urine 1.016 1.005 - 1.030   pH 5.0 5.0 - 8.0   Glucose, UA NEGATIVE NEGATIVE mg/dL   Hgb urine dipstick MODERATE (A) NEGATIVE   Bilirubin Urine NEGATIVE NEGATIVE   Ketones, ur NEGATIVE NEGATIVE mg/dL   Protein, ur 161 (A) NEGATIVE mg/dL   Nitrite NEGATIVE NEGATIVE   Leukocytes,Ua MODERATE (A) NEGATIVE   RBC / HPF 11-20 0 - 5 RBC/hpf   WBC, UA 6-10 0 - 5 WBC/hpf   Bacteria, UA FEW (A) NONE SEEN   Squamous Epithelial / LPF 0-5 0 - 5   Amorphous Crystal PRESENT     Comment: Performed at [facility], [address], [place], [place] [address]  Lactic acid, plasma     Status: None   Collection Time: 09/09/19  8:00 PM  Result Value Ref Range   Lactic Acid, Venous 1.7 0.5 - 1.9 mmol/L    Comment: Performed at [facility], [address], [place], [place] [address]  CBG monitoring, ED     Status: Abnormal   Collection Time: 09/10/19  3:02 AM  Result Value Ref Range   Glucose-Capillary 186 (H) 70 - 99 mg/dL  Blood gas, arterial (at [facility])     Status: Abnormal   Collection Time: 09/10/19  3:55 AM  Result Value Ref Range   FIO2 0.50    Delivery systems VENTILATOR    Mode PRESSURE REGULATED VOLUME CONTROL    VT 450 mL   LHR 22 resp/min   Peep/cpap +5 cm H20   pH, Arterial 7.189 (LL) 7.350 - 7.450     Comment: CRITICAL RESULT CALLED TO, READ BACK BY AND VERIFIED WITH: K JERRELL,RN 09/10/19 0420 RHOLMES    pCO2 arterial 32.5 32.0 - 48.0 mmHg   pO2, Arterial 77.3 (L) 83.0 - 108.0 mmHg   Bicarbonate 11.6 (L) 20.0 - 28.0 mmol/L   Acid-base deficit 14.8 (H) 0.0 - 2.0 mmol/L   O2 Saturation 89.6 %   Patient temperature 101.3    Collection site RIGHT RADIAL    Drawn by [id number]    Allens test (pass/fail) PASS PASS    Comment: Performed at [facility], [address] [place], [place] [address]  Troponin I (High Sensitivity)     Status: Abnormal   Collection Time: 09/10/19  5:30 AM  Result Value Ref Range   Troponin I (High Sensitivity) 1,010 (HH) <18 ng/L    Comment: CRITICAL RESULT CALLED TO, READ BACK BY AND VERIFIED WITH: NASH,J. RN AT 6213 09/10/19 MULLINS,T (NOTE) Elevated high sensitivity troponin I (hsTnI) values and significant  changes across serial measurements may suggest ACS but many other  chronic and acute conditions are known to elevate hsTnI results.  Refer to the Links section for chest pain algorithms and additional  guidance. Performed at [facility], [address] [place], [place] [address]   Basic metabolic panel     Status: Abnormal   Collection Time: 09/10/19  5:31 AM  Result Value Ref Range   Sodium 132 (L) 135 - 145 mmol/L   Potassium 4.2 3.5 - 5.1 mmol/L    Comment: DELTA CHECK NOTED NO VISIBLE HEMOLYSIS    Chloride 107 98 - 111 mmol/L   CO2 14 (L) 22 - 32 mmol/L  Glucose, Bld 193 (H) 70 - 99 mg/dL   BUN 32 (H) 8 - 23 mg/dL   Creatinine, Ser 3.54 (H) 0.44 - 1.00 mg/dL   Calcium 7.0 (L) 8.9 - 10.3 mg/dL   GFR calc non Af Amer 21 (L) >60 mL/min   GFR calc Af Amer 24 (L) >60 mL/min   Anion gap 11 5 - 15    Comment: Performed at [facility] [address] [place], [place] [address]  CBC     Status: Abnormal   Collection Time: 09/10/19  5:31 AM  Result Value Ref Range   WBC 2.6 (L) 4.0 -  10.5 K/uL   RBC 3.08 (L) 3.87 - 5.11 MIL/uL   Hemoglobin 9.2 (L) 12.0 - 15.0 g/dL   HCT 38.9 (L) 37.3 - 42.8 %   MCV 89.9 80.0 - 100.0 fL   MCH 29.9 26.0 - 34.0 pg   MCHC 33.2 30.0 - 36.0 g/dL   RDW 76.8 11.5 - 72.6 %   Platelets 122 (L) 150 - 400 K/uL   nRBC 0.0 0.0 - 0.2 %    Comment: Performed at [facility], [address] [place], [place] [address]  Hemoglobin A1c     Status: Abnormal   Collection Time: 09/10/19  5:31 AM  Result Value Ref Range   Hgb A1c MFr Bld 7.6 (H) 4.8 - 5.6 %    Comment: (NOTE) Pre diabetes:          5.7%-6.4% Diabetes:              >6.4% Glycemic control for   <7.0% adults with diabetes    Mean Plasma Glucose 171.42 mg/dL    Comment: Performed at [facility], [address], [place], [place] [address]  Troponin I (High Sensitivity)     Status: Abnormal   Collection Time: 09/10/19  5:31 AM  Result Value Ref Range   Troponin I (High Sensitivity) 1,029 (HH) <18 ng/L    Comment: CRITICAL VALUE NOTED.  VALUE IS CONSISTENT WITH PREVIOUSLY REPORTED AND CALLED VALUE. (NOTE) Elevated high sensitivity troponin I (hsTnI) values and significant  changes across serial measurements may suggest ACS but many other  chronic and acute conditions are known to elevate hsTnI results.  Refer to the Links section for chest pain algorithms and additional  guidance. Performed at [facility], [address] [place], [place] [address]   Lactic acid, plasma     Status: Abnormal   Collection Time: 09/10/19  5:31 AM  Result Value Ref Range   Lactic Acid, Venous 4.1 (HH) 0.5 - 1.9 mmol/L    Comment: CRITICAL RESULT CALLED TO, READ BACK BY AND VERIFIED WITH: NASH,J. RN AT [phone] 09/10/19 MULLINS,T Performed at [facility], [address], [place], [place] [address]   Blood gas, venous     Status: Abnormal   Collection Time: 09/10/19  5:31 AM  Result Value Ref Range   pH, Ven 7.261 7.250 - 7.430   pCO2,  Ven 34.1 (L) 44.0 - 60.0 mmHg   pO2, Ven 45.2 (H) 32.0 - 45.0 mmHg   Bicarbonate 14.8 (L) 20.0 - 28.0 mmol/L   Acid-Base Excess 10.9 (H) 0.0 - 2.0 mmol/L   O2 Saturation 78.2 %   Patient temperature 98.6     Comment: Performed at [facility], [address], [place], [place] [address]  CBG monitoring, ED     Status: Abnormal   Collection Time: 09/10/19  7:54 AM  Result Value Ref Range   Glucose-Capillary 157 (H) 70 - 99 mg/dL   Recent Results (from the past 240 hour(s))  SARS CORONAVIRUS 2 (TAT 6-24 HRS) Nasopharyngeal Nasopharyngeal Swab     Status: None   Collection Time: 09/09/19  6:45 PM   Specimen: Nasopharyngeal Swab  Result Value Ref Range Status   SARS Coronavirus 2 NEGATIVE NEGATIVE Final    Comment: (NOTE) SARS-CoV-2 target nucleic acids are NOT DETECTED. The SARS-CoV-2 RNA is generally detectable in upper and lower respiratory specimens during the acute phase of infection. Negative results do not preclude SARS-CoV-2 infection, do not rule out co-infections with other pathogens, and should not be used as the sole basis for treatment or other patient management decisions. Negative results must be combined with clinical observations, patient history, and epidemiological information. The expected result is Negative. Fact Sheet for Patients: HairSlick.nohttps://www.fda.gov/media/138098/download Fact Sheet for Healthcare Providers: quierodirigir.comhttps://www.fda.gov/media/138095/download This test is not yet approved or cleared by the [place] FDA and  has been authorized for detection and/or diagnosis of SARS-CoV-2 by FDA under an Emergency Use Authorization (EUA). This EUA will remain  in effect (meaning this test can be used) for the duration of the COVID-19 declaration under Section 56 4(b)(1) of the Act, 21 U.S.C. section 360bbb-3(b)(1), unless the authorization is terminated or revoked sooner. Performed at [facility], [address] [place],  [place] [id number]    Creatinine: Recent Labs    09/09/19 1639 09/10/19 0531  CREATININE 2.14* 2.30*   Baseline Creatinine: unknown  Impression/Assessment:  68yo with left hydronephrosis, sepsis from a urinary source  Plan:  The patient has sepsis in the setting of emphysematous pyelitis and a questionable left ureteral calculus. Due to the degree of sepsis the patient would benefit for left nephrostomy tube placement. Please contact IR for left nephrostomy tube placement  Rachel Nelson 09/10/2019, 8:04 AM

## 2019-09-10 NOTE — ED Notes (Signed)
Consult to Urology for Intensivist, ALI,MD.@04 :49pm.

## 2019-09-10 NOTE — ED Notes (Signed)
Son in law Merla Sawka and daughter Alleen Borne would like an update as soon as possible from RN and or doctor [phone].

## 2019-09-10 NOTE — ED Notes (Signed)
Critical care provider at bedside. Verbal order for 2 amps of bicarb

## 2019-09-10 NOTE — ED Notes (Signed)
ED TO INPATIENT HANDOFF REPORT  Name/Age/Gender Rachel Nelson 68 y.o. female  Code Status    Code Status Orders  (From admission, onward)         Start     Ordered   09/10/19 0036  Full code  Continuous     09/10/19 0040        Code Status History    Date Active Date Inactive Code Status Order ID Comments User Context   03/21/2017 2054 03/24/2017 1632 Full Code [id number]  Briscoe Deutscher, MD ED   Advance Care Planning Activity      Home/SNF/Other Home  Chief Complaint Sepsis due to urinary tract infection (HCC) [A41.9, N39.0]  Level of Care/Admitting Diagnosis ED Disposition    ED Disposition Condition Comment   Admit  Hospital Area: [facility] [[id number]]  Level of Care: Stepdown [14]  Admit to SDU based on following criteria: Hemodynamic compromise or significant risk of instability:  Patient requiring short term acute titration and management of vasoactive drips, and invasive monitoring (i.e., CVP and Arterial line).  Covid Evaluation: Asymptomatic Screening Protocol (No Symptoms)  Diagnosis: Sepsis due to urinary tract infection [facility]) [[id number]]  Admitting Physician: Charlsie Quest [[id number]]  Attending Physician: Charlsie Quest [[id number]]  Estimated length of stay: past midnight tomorrow  Certification:: I certify this patient will need inpatient services for at least 2 midnights       Medical History Past Medical History:  Diagnosis Date  . Acute pyelonephritis 03/21/2017  . High cholesterol   . Renal insufficiency 03/21/2017  . Type II diabetes mellitus (HCC)     Allergies No Known Allergies  IV Location/Drains/Wounds Patient Lines/Drains/Airways Status   Active Line/Drains/Airways    Name:   Placement date:   Placement time:   Site:   Days:   Peripheral IV 09/09/19 Right Antecubital   09/09/19    1646    Antecubital   1   Peripheral IV 09/09/19 Left Hand   09/09/19    2006    Hand   1   Peripheral IV 09/09/19 Right Hand   09/09/19     2016    Hand   1   Peripheral IV 09/10/19 Right Arm   09/10/19    0540    Arm   less than 1   CVC Triple Lumen 09/10/19 Left External jugular   09/10/19    --     less than 1   NG/OG Tube Orogastric Left mouth Xray   09/10/19    0345    Left mouth   less than 1   Airway 7.5 mm   09/10/19    0340     less than 1          Labs/Imaging Results for orders placed or performed during the hospital encounter of 09/09/19 (from the past 48 hour(s))  CBG monitoring, ED     Status: Abnormal   Collection Time: 09/09/19  4:02 PM  Result Value Ref Range   Glucose-Capillary 213 (H) 70 - 99 mg/dL   Comment 1 Notify RN   Comprehensive metabolic panel     Status: Abnormal   Collection Time: 09/09/19  4:39 PM  Result Value Ref Range   Sodium 131 (L) 135 - 145 mmol/L   Potassium 3.3 (L) 3.5 - 5.1 mmol/L   Chloride 101 98 - 111 mmol/L   CO2 19 (L) 22 - 32 mmol/L   Glucose, Bld 205 (H) 70 - 99 mg/dL  BUN 31 (H) 8 - 23 mg/dL   Creatinine, Ser 2.14 (H) 0.44 - 1.00 mg/dL   Calcium 8.6 (L) 8.9 - 10.3 mg/dL   Total Protein 7.3 6.5 - 8.1 g/dL   Albumin 3.4 (L) 3.5 - 5.0 g/dL   AST 19 15 - 41 U/L   ALT 13 0 - 44 U/L   Alkaline Phosphatase 138 (H) 38 - 126 U/L   Total Bilirubin 0.9 0.3 - 1.2 mg/dL   GFR calc non Af Amer 23 (L) >60 mL/min   GFR calc Af Amer 27 (L) >60 mL/min   Anion gap 11 5 - 15    Comment: Performed at [facility], [place] [address], [place], [place] [address]  Lactic acid, plasma     Status: Abnormal   Collection Time: 09/09/19  4:39 PM  Result Value Ref Range   Lactic Acid, Venous 3.1 (HH) 0.5 - 1.9 mmol/L    Comment: CRITICAL RESULT CALLED TO, READ BACK BY AND VERIFIED WITH: Z.TETTERS,RN [id number] @1803  BY V.WILKINS Performed at [facility], [place] [address], [place], [place] [address]   CBC with Differential     Status: Abnormal   Collection Time: 09/09/19  4:39 PM  Result Value Ref Range   WBC 6.2 4.0 - 10.5 K/uL   RBC 3.38 (L) 3.87 - 5.11  MIL/uL   Hemoglobin 9.6 (L) 12.0 - 15.0 g/dL   HCT 29.5 (L) 36.0 - 46.0 %   MCV 87.3 80.0 - 100.0 fL   MCH 28.4 26.0 - 34.0 pg   MCHC 32.5 30.0 - 36.0 g/dL   RDW 13.6 11.5 - 15.5 %   Platelets 200 150 - 400 K/uL   nRBC 0.0 0.0 - 0.2 %   Neutrophils Relative % 92 %   Neutro Abs 5.8 1.7 - 7.7 K/uL   Lymphocytes Relative 6 %   Lymphs Abs 0.4 (L) 0.7 - 4.0 K/uL   Monocytes Relative 1 %   Monocytes Absolute 0.1 0.1 - 1.0 K/uL   Eosinophils Relative 0 %   Eosinophils Absolute 0.0 0.0 - 0.5 K/uL   Basophils Relative 0 %   Basophils Absolute 0.0 0.0 - 0.1 K/uL   WBC Morphology VACUOLATED NEUTROPHILS    Immature Granulocytes 1 %   Abs Immature Granulocytes 0.03 0.00 - 0.07 K/uL    Comment: Performed at [facility], [place] [address], [place], [place] [address]  Protime-INR     Status: Abnormal   Collection Time: 09/09/19  4:39 PM  Result Value Ref Range   Prothrombin Time 17.6 (H) 11.4 - 15.2 seconds   INR 1.5 (H) 0.8 - 1.2    Comment: (NOTE) INR goal varies based on device and disease states. Performed at [facility], [place] [address], [place], [place] [address]   HIV Antibody (routine testing w rflx)     Status: None   Collection Time: 09/09/19  4:45 PM  Result Value Ref Range   HIV Screen 4th Generation wRfx NON REACTIVE NON REACTIVE    Comment: Performed at [place] [address] [place], [place] [address]  SARS CORONAVIRUS 2 (TAT 6-24 HRS) Nasopharyngeal Nasopharyngeal Swab     Status: None   Collection Time: 09/09/19  6:45 PM   Specimen: Nasopharyngeal Swab  Result Value Ref Range   SARS Coronavirus 2 NEGATIVE NEGATIVE    Comment: (NOTE) SARS-CoV-2 target nucleic acids are NOT DETECTED. The SARS-CoV-2 RNA is generally detectable in upper and lower  respiratory specimens during the acute phase of infection. Negative results do not preclude SARS-CoV-2 infection, do not rule out co-infections with other pathogens, and should not  be used as the sole basis for treatment or other patient management decisions. Negative results must be combined with clinical observations, patient history, and epidemiological information. The expected result is Negative. Fact Sheet for Patients: HairSlick.no Fact Sheet for Healthcare Providers: quierodirigir.com This test is not yet approved or cleared by the [place] FDA and  has been authorized for detection and/or diagnosis of SARS-CoV-2 by FDA under an Emergency Use Authorization (EUA). This EUA will remain  in effect (meaning this test can be used) for the duration of the COVID-19 declaration under Section 56 4(b)(1) of the Act, 21 U.S.C. section 360bbb-3(b)(1), unless the authorization is terminated or revoked sooner. Performed at [facility], [address] [place], [place] [address]   Urinalysis, Routine w reflex microscopic     Status: Abnormal   Collection Time: 09/09/19  7:00 PM  Result Value Ref Range   Color, Urine AMBER (A) YELLOW    Comment: BIOCHEMICALS MAY BE AFFECTED BY COLOR   APPearance CLOUDY (A) CLEAR   Specific Gravity, Urine 1.016 1.005 - 1.030   pH 5.0 5.0 - 8.0   Glucose, UA NEGATIVE NEGATIVE mg/dL   Hgb urine dipstick MODERATE (A) NEGATIVE   Bilirubin Urine NEGATIVE NEGATIVE   Ketones, ur NEGATIVE NEGATIVE mg/dL   Protein, ur 604 (A) NEGATIVE mg/dL   Nitrite NEGATIVE NEGATIVE   Leukocytes,Ua MODERATE (A) NEGATIVE   RBC / HPF 11-20 0 - 5 RBC/hpf   WBC, UA 6-10 0 - 5 WBC/hpf   Bacteria, UA FEW (A) NONE SEEN   Squamous Epithelial / LPF 0-5 0 - 5   Amorphous Crystal PRESENT     Comment: Performed at [facility], [address], [place], [place] [address]  Lactic acid, plasma     Status: None   Collection Time: 09/09/19  8:00 PM  Result Value Ref Range   Lactic Acid, Venous 1.7 0.5 - 1.9 mmol/L    Comment: Performed at [facility], [address] [place], [place] [address]  CBG monitoring, ED     Status: Abnormal   Collection Time: 09/10/19  3:02 AM  Result Value Ref Range   Glucose-Capillary 186 (H) 70 - 99 mg/dL  Blood gas, arterial (at [facility])     Status: Abnormal   Collection Time: 09/10/19  3:55 AM  Result Value Ref Range   FIO2 0.50    Delivery systems VENTILATOR    Mode PRESSURE REGULATED VOLUME CONTROL    VT 450 mL   LHR 22 resp/min   Peep/cpap +5 cm H20   pH, Arterial 7.189 (LL) 7.350 - 7.450    Comment: CRITICAL RESULT CALLED TO, READ BACK BY AND VERIFIED WITH: K JERRELL,RN 09/10/19 0420 RHOLMES    pCO2 arterial 32.5 32.0 - 48.0 mmHg   pO2, Arterial 77.3 (L) 83.0 - 108.0 mmHg   Bicarbonate 11.6 (L) 20.0 - 28.0 mmol/L   Acid-base deficit 14.8 (H) 0.0 - 2.0 mmol/L   O2 Saturation 89.6 %   Patient temperature 101.3    Collection site RIGHT RADIAL    Drawn by [id number]    Allens test (pass/fail) PASS PASS    Comment: Performed at [facility], [address] [place], [place] [address]  Cortisol     Status: None   Collection Time: 09/10/19  5:30 AM  Result Value Ref Range   Cortisol, Plasma 23.4 ug/dL    Comment: (NOTE) AM    6.7 - 22.6 ug/dL PM   <60.6       ug/dL Performed at [facility], [address], [place], [place] [address]   Troponin I (High Sensitivity)     Status: Abnormal   Collection Time: 09/10/19  5:30 AM  Result Value Ref Range   Troponin I (High Sensitivity) 1,010 (HH) <18 ng/L    Comment: CRITICAL RESULT CALLED TO, READ BACK BY AND VERIFIED WITH: NASH,J. RN AT 1093 09/10/19 MULLINS,T (NOTE) Elevated high sensitivity troponin I (hsTnI) values and significant  changes across serial measurements may suggest ACS but many other  chronic and acute conditions are known to elevate hsTnI results.  Refer to the Links section for chest pain algorithms and additional  guidance. Performed at [facility], [address] [place], [place]  [address]   Basic metabolic panel     Status: Abnormal   Collection Time: 09/10/19  5:31 AM  Result Value Ref Range   Sodium 132 (L) 135 - 145 mmol/L   Potassium 4.2 3.5 - 5.1 mmol/L    Comment: DELTA CHECK NOTED NO VISIBLE HEMOLYSIS    Chloride 107 98 - 111 mmol/L   CO2 14 (L) 22 - 32 mmol/L   Glucose, Bld 193 (H) 70 - 99 mg/dL   BUN 32 (H) 8 - 23 mg/dL   Creatinine, Ser 3.22 (H) 0.44 - 1.00 mg/dL   Calcium 7.0 (L) 8.9 - 10.3 mg/dL   GFR calc non Af Amer 21 (L) >60 mL/min   GFR calc Af Amer 24 (L) >60 mL/min   Anion gap 11 5 - 15    Comment: Performed at [facility], [address], [place], [place] [address]  CBC     Status: Abnormal   Collection Time: 09/10/19  5:31 AM  Result Value Ref Range   WBC 2.6 (L) 4.0 - 10.5 K/uL   RBC 3.08 (L) 3.87 - 5.11 MIL/uL   Hemoglobin 9.2 (L) 12.0 - 15.0 g/dL   HCT 70.6 (L) 23.7 - 62.8 %   MCV 89.9 80.0 - 100.0 fL   MCH 29.9 26.0 - 34.0 pg   MCHC 33.2 30.0 - 36.0 g/dL   RDW 31.5 17.6 - 16.0 %   Platelets 122 (L) 150 - 400 K/uL   nRBC 0.0 0.0 - 0.2 %    Comment: Performed at [facility], [address], [place], [place] [address]  Hemoglobin A1c     Status: Abnormal   Collection Time: 09/10/19  5:31 AM  Result Value Ref Range   Hgb A1c MFr Bld 7.6 (H) 4.8 - 5.6 %    Comment: (NOTE) Pre diabetes:          5.7%-6.4% Diabetes:              >6.4% Glycemic control for   <7.0% adults with diabetes    Mean Plasma Glucose 171.42 mg/dL    Comment: Performed at [facility], [address], [place], [place] [address]  Troponin I (High Sensitivity)     Status: Abnormal   Collection Time: 09/10/19  5:31 AM  Result Value Ref Range   Troponin I (High Sensitivity) 1,029 (HH) <18 ng/L    Comment: CRITICAL VALUE NOTED.  VALUE IS CONSISTENT WITH PREVIOUSLY REPORTED AND CALLED VALUE. (NOTE) Elevated high sensitivity troponin I (hsTnI) values  and significant  changes across serial measurements may suggest  ACS but many other  chronic and acute conditions are known to elevate hsTnI results.  Refer to the Links section for chest pain algorithms and additional  guidance. Performed at [facility], [address], [place], [place] [address]   Lactic acid, plasma     Status: Abnormal   Collection Time: 09/10/19  5:31 AM  Result Value Ref Range   Lactic Acid, Venous 4.1 (HH) 0.5 - 1.9 mmol/L    Comment: CRITICAL RESULT CALLED TO, READ BACK BY AND VERIFIED WITH: NASH,J. RN AT [phone] 09/10/19 MULLINS,T Performed at [facility], [address] [place], [place] [address]   Blood gas, venous     Status: Abnormal   Collection Time: 09/10/19  5:31 AM  Result Value Ref Range   pH, Ven 7.261 7.250 - 7.430   pCO2, Ven 34.1 (L) 44.0 - 60.0 mmHg   pO2, Ven 45.2 (H) 32.0 - 45.0 mmHg   Bicarbonate 14.8 (L) 20.0 - 28.0 mmol/L   Acid-Base Excess 10.9 (H) 0.0 - 2.0 mmol/L   O2 Saturation 78.2 %   Patient temperature 98.6     Comment: Performed at [facility], [address], [place], [place] [address]  Lactic acid, plasma     Status: Abnormal   Collection Time: 09/10/19  5:36 AM  Result Value Ref Range   Lactic Acid, Venous 3.2 (HH) 0.5 - 1.9 mmol/L    Comment: CRITICAL VALUE NOTED.  VALUE IS CONSISTENT WITH PREVIOUSLY REPORTED AND CALLED VALUE. Performed at [facility], [address], [place], [place] [address]   CBG monitoring, ED     Status: Abnormal   Collection Time: 09/10/19  7:54 AM  Result Value Ref Range   Glucose-Capillary 157 (H) 70 - 99 mg/dL  Urine culture     Status: None (Preliminary result)   Collection Time: 09/10/19  9:32 AM   Specimen: Urine, Catheterized  Result Value Ref Range   Specimen Description      URINE, CATHETERIZED RECD IN A SYRINGE Performed at [facility], [address] [place], [place] [address]    Special Requests      Normal Performed at [facility], [address], [place], [place] [address]    Culture PENDING    Report Status PENDING    DG Abdomen 1 View  Result Date: 09/10/2019 CLINICAL DATA:  NG tube placement. EXAM: ABDOMEN - 1 VIEW COMPARISON:  None. FINDINGS: The NG tube is in the body region of the stomach. The bowel gas pattern is unremarkable. IMPRESSION: NG tube in good position in the body region of the stomach. Electronically Signed   By: Rudie Meyer M.D.   On: 09/10/2019 05:32   DG Chest Portable 1 View  Result Date: 09/10/2019 CLINICAL DATA:  Central line placement EXAM: PORTABLE CHEST 1 VIEW COMPARISON:  Earlier today FINDINGS: Stable positioning of existing hardware. New left IJ line with tip at the upper cavoatrial junction. No visible pneumothorax. Stable interstitial coarsening with a few Kerley lines, the patient has received 4 L of IVF. No visible effusion. Normal heart size. IMPRESSION: New central line without complicating feature. Stable interstitial opacity, likely mild edema. Electronically Signed   By: Marnee Spring M.D.   On: 09/10/2019 07:02   DG Chest Portable 1 View  Result Date: 09/10/2019 CLINICAL DATA:  Intubation EXAM: PORTABLE CHEST 1 VIEW COMPARISON:  Yesterday FINDINGS: Endotracheal  tube tip is just below the clavicular heads. The enteric tube at least reaches the stomach. Normal heart size. Prominent vascular pedicle widening without bronchus depression or apical capping. This is attributed to supine positioning as there was no paratracheal thickening on comparison upright film. There are a few Kerley lines in generalized interstitial coarsening. No effusion or pneumothorax. IMPRESSION: 1. Unremarkable hardware positioning. 2. Mild interstitial opacity that could be atelectasis or mild edema. Electronically Signed   By: Marnee Spring M.D.   On: 09/10/2019 04:16   DG Chest Port 1 View  Result Date: 09/09/2019 CLINICAL DATA:  Generalized weakness and malaise. Back pain for 3 days. EXAM: PORTABLE  CHEST 1 VIEW COMPARISON:  One-view chest x-ray 07/27/2016 FINDINGS: The heart size is normal. Atherosclerotic changes are noted at the aortic arch. Mild bibasilar airspace opacities are present, left greater than right. Upper lung fields are clear. Small left effusion is suspected. IMPRESSION: 1. Mild bibasilar airspace disease, left greater than right. While this likely reflects atelectasis, infection is not excluded. 2. Probable small left pleural effusion. Electronically Signed   By: Marin Roberts M.D.   On: 09/09/2019 17:18   CT Renal Stone Study  Addendum Date: 09/10/2019   ADDENDUM REPORT: 09/10/2019 04:34 ADDENDUM: Case reviewed in conjunction with post intubation chest x-ray. Mid left ureteral calculi are noted just beyond the gonadal vein crossing - there is a 15 mm long segment of clustered calculi measuring up to 7 mm in thickness. Left emphysematous pyelitis as noted previously. These results were called by telephone at the time of interpretation on 09/10/2019 at 4:34 am to provider Dr. Karie Mainland, who verbally acknowledged these results. Electronically Signed   By: Marnee Spring M.D.   On: 09/10/2019 04:34   Result Date: 09/10/2019 CLINICAL DATA:  Back and flank pain laterality not specified, question kidney stone disease EXAM: CT ABDOMEN AND PELVIS WITHOUT CONTRAST TECHNIQUE: Multidetector CT imaging of the abdomen and pelvis was performed following the standard protocol without IV contrast. Sagittal and coronal MPR images reconstructed from axial data set. No oral contrast administered. COMPARISON:  03/21/2018 FINDINGS: Lower chest: Bibasilar atelectasis versus infiltrate in lower lobes. Hepatobiliary: Gallbladder and liver normal appearance Pancreas: Normal appearance Spleen: Normal appearance Adrenals/Urinary Tract: Adrenal glands and RIGHT kidney normal appearance. LEFT hydronephrosis and hydroureter with perinephric edema. No definite ureteral calcification visualized. Single small focus  of gas at upper pole of LEFT kidney. No renal masses or calculi identified. RIGHT ureter decompressed. Bladder unremarkable; no air in bladder. Stomach/Bowel: Stomach decompressed. Large and small bowel loops unremarkable. Low lying cecum in pelvis. Appendix not visualized. Vascular/Lymphatic: Few pelvic phleboliths. Atherosclerotic calcifications aorta without aneurysm. Prominent LEFT para-aortic lymph nodes at the level of the kidney, largest 11 mm short axis image 34. Reproductive: Unremarkable uterus and ovaries Other: No free air or free fluid.  No hernia. Musculoskeletal: Osseous demineralization. IMPRESSION: LEFT hydronephrosis, hydroureter, and perinephric edema though no definite ureteral calculus is visualized. This could be due to a passed renal calculus. However, a single focus of gas is identified at the upper pole of the LEFT kidney, raising question that these findings could be due to emphysematous pyelonephritis rather than stone disease; recommend correlation with urinalysis. Repeat CT imaging could also be performed with IV contrast if urinalysis is nondiagnostic, which would allow for detection of ureteral enhancement and the typical patchy renal parenchymal enhancement of pyelonephritis. Findings called to Dietrich Pates PA on 09/09/2019 at 1843 hours. Electronically Signed: By: Ulyses Southward M.D. On: 09/09/2019  18:47   IR NEPHROSTOMY PLACEMENT LEFT  Result Date: 09/10/2019 INDICATION: Patient admitted with urosepsis found to have obstructing left-sided ureteral stone. Request made for placement of image guided left-sided percutaneous nephrostomy catheter for infection source control purposes. EXAM: 1. ULTRASOUND GUIDANCE FOR PUNCTURE OF THE LEFT RENAL COLLECTING SYSTEM 2. LEFT PERCUTANEOUS NEPHROSTOMY TUBE PLACEMENT. COMPARISON:  CT abdomen and pelvis - 09/10/2019 MEDICATIONS: Patient admitted to the hospital receiving intravenous antibiotics; the antibiotic was administered in an appropriate  time frame prior to skin puncture. ANESTHESIA/SEDATION: None, the patient is currently intubated CONTRAST:  15 cc Omnipaque 300 administered into the collecting system FLUOROSCOPY TIME:  1 minute, 6 seconds (13 mGy) COMPLICATIONS: None immediate. PROCEDURE: The procedure, risks, benefits, and alternatives were explained to the patient's son. Questions regarding the procedure were encouraged and answered. The patient's son understands and consents to the procedure. A timeout was performed prior to the initiation of the procedure. The left flank region was prepped with Betadine in a sterile fashion, and a sterile drape was applied covering the operative field. A sterile gown and sterile gloves were used for the procedure. Local anesthesia was provided with 1% Lidocaine with epinephrine. Ultrasound was used to localize the left kidney. Under direct ultrasound guidance, a 21 gauge needle was advanced into the renal collecting system. An ultrasound image documentation was performed. Access within the collecting system was confirmed with the efflux of urine followed by contrast injection. Over a Nitrex wire, the tract was dilated with an Accustick stent. Next, a short Amplatz wire was coiled in the left renal collecting system. Under intermittent fluoroscopic guidance, the track was dilated ultimately allowing placement of a 10-French percutaneous nephrostomy catheter with end coiled and locked within left renal pelvis. Contrast was injected and several sport radiographs were obtained in various obliquities confirming access. A small sample of purulent appearing aspirated urine was capped and sent laboratory analysis. The catheter was secured at the skin with a Prolene retention suture and a gravity bag was placed. A dressing was placed. The patient tolerated procedure well without immediate postprocedural complication. FINDINGS: Ultrasound scanning demonstrates a moderately dilated collecting system as demonstrated on  preceding abdominal CT. Under direct ultrasound guidance, a posterior inferior calix was targeted allowing advancement of an 10-French percutaneous nephrostomy catheter under intermittent fluoroscopic guidance. Contrast injection confirmed appropriate positioning. IMPRESSION: 1. Successful ultrasound and fluoroscopic guided placement of a left sided 10 French PCN. 2. Small sample of aspirated purulent appearing urine was capped and sent to the laboratory for analysis. Electronically Signed   By: Simonne ComeJohn  Watts M.D.   On: 09/10/2019 09:35    Pending Labs Unresulted Labs (From admission, onward)    Start     Ordered   09/10/19 1212  Blood gas, arterial  Once,   R     09/10/19 1211   09/10/19 0530  Lactic acid, plasma  Now then every 2 hours,   STAT     09/10/19 0446   09/09/19 1625  Urine culture  ONCE - STAT,   STAT     09/09/19 1624   09/09/19 1605  Culture, blood (Routine x 2)  BLOOD CULTURE X 2,   STAT     09/09/19 1604   Signed and Held  CBC  Once-Timed,   R     Signed and Held   Signed and Held  Basic metabolic panel  Once-Timed,   R     Signed and Held          Vitals/Pain  Today's Vitals   09/10/19 1212 09/10/19 1215 09/10/19 1218 09/10/19 1221  BP: (!) 88/57 (!) 88/56 (!) 92/47 (!) 102/50  Pulse: (!) 115 (!) 114 (!) 113 (!) 121  Resp: 20 (!) 22 (!) 21 17  Temp: (!) 102.2 F (39 C) (!) 102 F (38.9 C) (!) 102 F (38.9 C) (!) 102 F (38.9 C)  TempSrc:      SpO2: 90% (!) 88% (!) 89% 93%  Weight:      Height:      PainSc:        Isolation Precautions No active isolations  Medications Medications  0.9 %  sodium chloride infusion (0 mLs Intravenous Stopped 09/10/19 0951)  sodium chloride flush (NS) 0.9 % injection 3 mL (3 mLs Intravenous Not Given 09/10/19 1028)  0.9 %  sodium chloride infusion (125 mL/hr Intravenous Rate/Dose Verify 09/10/19 1040)  ondansetron (ZOFRAN) tablet 4 mg (has no administration in time range)  atorvastatin (LIPITOR) tablet 20 mg (has no  administration in time range)  insulin aspart (novoLOG) injection 0-9 Units (0 Units Subcutaneous Not Given 09/10/19 0815)  meropenem (MERREM) 1 g in sodium chloride 0.9 % 100 mL IVPB (1 g Intravenous New Bag/Given (Non-Interop) 09/10/19 0950)  norepinephrine (LEVOPHED)  in premix infusion (40 mcg/min Intravenous New Bag/Given (Non-Interop) 09/10/19 1152)  potassium chloride 10 mEq in 100 mL IVPB ( Intravenous Canceled Entry 09/10/19 1040)  propofol (DIPRIVAN) 1000 MG/100ML infusion (56.3 mcg/kg/min  54 kg Intravenous Rate/Dose Change 09/10/19 1130)  fentaNYL (SUBLIMAZE) injection 25 mcg (has no administration in time range)  fentaNYL (SUBLIMAZE) injection 25-100 mcg (has no administration in time range)  fentaNYL in NS (6mcg/ml) infusion-PREMIX (75 mcg/hr Intravenous Rate/Dose Change 09/10/19 0845)  fentaNYL (SUBLIMAZE) bolus via infusion 25 mcg (has no administration in time range)  docusate (COLACE) 50 MG/5ML liquid 100 mg (has no administration in time range)  famotidine (PEPCID) IVPB 20 mg premix (20 mg Intravenous New Bag/Given (Non-Interop) 09/10/19 1028)  acetaminophen (TYLENOL) suppository 650 mg (650 mg Rectal Given 09/10/19 0944)  lidocaine-EPINEPHrine (XYLOCAINE W/EPI) 2 %-1:200000 (PF) injection (has no administration in time range)  vasopressin (PITRESSIN) 40 Units in sodium chloride 0.9 % 250 mL (0.16 Units/mL) infusion (0.03 Units/min Intravenous New Bag/Given 09/10/19 1203)  hydrocortisone sodium succinate (SOLU-CORTEF) 100 MG injection 50 mg (50 mg Intravenous Given 09/10/19 1221)  sodium bicarbonate 1 mEq/mL injection (has no administration in time range)  sodium chloride 0.9 % bolus 1,000 mL (0 mLs Intravenous Stopped 09/09/19 1835)  acetaminophen (TYLENOL) tablet 650 mg (650 mg Oral Given 09/09/19 1655)  meropenem (MERREM) 1 g in sodium chloride 0.9 % 100 mL IVPB (0 g Intravenous Stopped 09/09/19 1832)  sodium chloride 0.9 % bolus 1,000 mL (0 mLs  Intravenous Stopped 09/09/19 2046)  vancomycin (VANCOCIN) IVPB 1000 mg/200 mL premix (0 mg Intravenous Stopped 09/09/19 2136)  lactated ringers bolus 1,000 mL (0 mLs Intravenous Stopped 09/09/19 2259)  sodium chloride 0.9 % bolus 1,000 mL (0 mLs Intravenous Stopped 09/10/19 0059)  fentaNYL (SUBLIMAZE) injection 100 mcg (50 mcg Intravenous Given 09/10/19 0410)  fentaNYL (SUBLIMAZE) 100 MCG/2ML injection (50 mcg  Given 09/10/19 0631)  iohexol (OMNIPAQUE) 300 MG/ML solution 50 mL (15 mLs Other Contrast Given 09/10/19 0922)  sodium chloride 0.9 % bolus 1,000 mL (1,000 mLs Intravenous New Bag/Given (Non-Interop) 09/10/19 1136)  sodium bicarbonate injection 100 mEq (100 mEq Intravenous Given 09/10/19 1220)    Mobility walks

## 2019-09-10 NOTE — ED Notes (Signed)
0345 temp foley placed

## 2019-09-10 NOTE — Consult Note (Signed)
Chief Complaint: Patient was seen in consultation today for left percutaneous nephrostomy Chief Complaint  Patient presents with  . Fatigue   Referring Physician(s): Ali,M  Supervising Physician: Sandi Mariscal  Patient Status: [facility] - ED  History of Present Illness: Rachel Nelson is a 68 y.o. female with past medical history of diabetes who presents to the ED now with left flank pain, chills as well as fever.  She was found to be hypotensive with elevated creatinine and lactic acid.  She is COVID-19 negative.  Subsequent imaging revealed left hydronephrosis with hydroureter and perinephric edema and mid left ureteral calculi noted just beyond the gonadal vein crossing along with findings of left emphysematous pyelonephritis.  Blood and urine cultures pending.  Patient now intubated and on pressor support..  Request now received for emergent left percutaneous nephrostomy.  History of E. coli UTI in 2018.  Past Medical History:  Diagnosis Date  . Acute pyelonephritis 03/21/2017  . High cholesterol   . Renal insufficiency 03/21/2017  . Type II diabetes mellitus ([place])     Past Surgical History:  Procedure Laterality Date  . NO PAST SURGERIES      Allergies: Patient has no known allergies.  Medications: Prior to Admission medications   Medication Sig Start Date End Date Taking? Authorizing Provider  acetaminophen (TYLENOL) 325 MG tablet Take 325 mg by mouth every 6 (six) hours as needed for mild pain or headache.   Yes [provider]  aspirin EC 81 MG tablet Take 81 mg by mouth daily.   Yes [provider]  atorvastatin (LIPITOR) 20 MG tablet Take 20 mg by mouth daily. 07/25/19  Yes [provider]  metFORMIN (GLUCOPHAGE-XR) 500 MG 24 hr tablet Take 500 mg by mouth 2 (two) times daily with a meal.   Yes [provider]  insulin glargine (LANTUS) 100 UNIT/ML injection Inject 0.1 mLs (10 Units total) into the skin at bedtime. Patient not taking:  Reported on 09/09/2019 03/24/17   Oswald Hillock, MD  ondansetron (ZOFRAN) 4 MG tablet Take 1 tablet (4 mg total) by mouth every 6 (six) hours. Prn n/v. Patient not taking: Reported on 03/21/2017 07/27/16   Billy Fischer, MD  sulfamethoxazole-trimethoprim (BACTRIM DS,SEPTRA DS) 800-160 MG tablet Take 1 tablet by mouth 2 (two) times daily. Patient not taking: Reported on 09/09/2019 03/24/17   Oswald Hillock, MD     History reviewed. No pertinent family history.  Social History   Socioeconomic History  . Marital status: Married    Spouse name: Not on file  . Number of children: Not on file  . Years of education: Not on file  . Highest education level: Not on file  Occupational History  . Not on file  Tobacco Use  . Smoking status: Never Smoker  . Smokeless tobacco: Never Used  Substance and Sexual Activity  . Alcohol use: No  . Drug use: No  . Sexual activity: Not on file  Other Topics Concern  . Not on file  Social History Narrative  . Not on file   Social Determinants of Health   Financial Resource Strain:   . Difficulty of Paying Living Expenses: Not on file  Food Insecurity:   . Worried About Charity fundraiser in the Last Year: Not on file  . Ran Out of Food in the Last Year: Not on file  Transportation Needs:   . Lack of Transportation (Medical): Not on file  . Lack of Transportation (Non-Medical): Not on  file  Physical Activity:   . Days of Exercise per Week: Not on file  . Minutes of Exercise per Session: Not on file  Stress:   . Feeling of Stress : Not on file  Social Connections:   . Frequency of Communication with Friends and Family: Not on file  . Frequency of Social Gatherings with Friends and Family: Not on file  . Attends Religious Services: Not on file  . Active Member of Clubs or Organizations: Not on file  . Attends Banker Meetings: Not on file  . Marital Status: Not on file      Review of Systems see above; pt now intubated  Vital  Signs: BP (!) 110/54   Pulse (!) 119   Temp (!) 102.7 F (39.3 C)   Resp (!) 25   Ht  (1.651 m)   Wt 119 lb (54 kg)   SpO2 96%   BMI 19.80 kg/m   Physical Exam intubated; chest clear to auscultation bilaterally.  Heart with tachycardic but regular rhythm.  Abdomen soft, positive bowel sounds, nontender.  No lower extremity edema.  Imaging: DG Abdomen 1 View  Result Date: 09/10/2019 CLINICAL DATA:  NG tube placement. EXAM: ABDOMEN - 1 VIEW COMPARISON:  None. FINDINGS: The NG tube is in the body region of the stomach. The bowel gas pattern is unremarkable. IMPRESSION: NG tube in good position in the body region of the stomach. Electronically Signed   By: Rudie Meyer M.D.   On: 09/10/2019 05:32   DG Chest Portable 1 View  Result Date: 09/10/2019 CLINICAL DATA:  Central line placement EXAM: PORTABLE CHEST 1 VIEW COMPARISON:  Earlier today FINDINGS: Stable positioning of existing hardware. New left IJ line with tip at the upper cavoatrial junction. No visible pneumothorax. Stable interstitial coarsening with a few Kerley lines, the patient has received 4 L of IVF. No visible effusion. Normal heart size. IMPRESSION: New central line without complicating feature. Stable interstitial opacity, likely mild edema. Electronically Signed   By: Marnee Spring M.D.   On: 09/10/2019 07:02   DG Chest Portable 1 View  Result Date: 09/10/2019 CLINICAL DATA:  Intubation EXAM: PORTABLE CHEST 1 VIEW COMPARISON:  Yesterday FINDINGS: Endotracheal tube tip is just below the clavicular heads. The enteric tube at least reaches the stomach. Normal heart size. Prominent vascular pedicle widening without bronchus depression or apical capping. This is attributed to supine positioning as there was no paratracheal thickening on comparison upright film. There are a few Kerley lines in generalized interstitial coarsening. No effusion or pneumothorax. IMPRESSION: 1. Unremarkable hardware positioning. 2. Mild  interstitial opacity that could be atelectasis or mild edema. Electronically Signed   By: Marnee Spring M.D.   On: 09/10/2019 04:16   DG Chest Port 1 View  Result Date: 09/09/2019 CLINICAL DATA:  Generalized weakness and malaise. Back pain for 3 days. EXAM: PORTABLE CHEST 1 VIEW COMPARISON:  One-view chest x-ray 07/27/2016 FINDINGS: The heart size is normal. Atherosclerotic changes are noted at the aortic arch. Mild bibasilar airspace opacities are present, left greater than right. Upper lung fields are clear. Small left effusion is suspected. IMPRESSION: 1. Mild bibasilar airspace disease, left greater than right. While this likely reflects atelectasis, infection is not excluded. 2. Probable small left pleural effusion. Electronically Signed   By: Marin Roberts M.D.   On: 09/09/2019 17:18   CT Renal Stone Study  Addendum Date: 09/10/2019   ADDENDUM REPORT: 09/10/2019 04:34 ADDENDUM: Case reviewed in conjunction with  post intubation chest x-ray. Mid left ureteral calculi are noted just beyond the gonadal vein crossing - there is a 15 mm long segment of clustered calculi measuring up to 7 mm in thickness. Left emphysematous pyelitis as noted previously. These results were called by telephone at the time of interpretation on 09/10/2019 at 4:34 am to provider Dr. Karie Mainland, who verbally acknowledged these results. Electronically Signed   By: Marnee Spring M.D.   On: 09/10/2019 04:34   Result Date: 09/10/2019 CLINICAL DATA:  Back and flank pain laterality not specified, question kidney stone disease EXAM: CT ABDOMEN AND PELVIS WITHOUT CONTRAST TECHNIQUE: Multidetector CT imaging of the abdomen and pelvis was performed following the standard protocol without IV contrast. Sagittal and coronal MPR images reconstructed from axial data set. No oral contrast administered. COMPARISON:  03/21/2018 FINDINGS: Lower chest: Bibasilar atelectasis versus infiltrate in lower lobes. Hepatobiliary: Gallbladder and  liver normal appearance Pancreas: Normal appearance Spleen: Normal appearance Adrenals/Urinary Tract: Adrenal glands and RIGHT kidney normal appearance. LEFT hydronephrosis and hydroureter with perinephric edema. No definite ureteral calcification visualized. Single small focus of gas at upper pole of LEFT kidney. No renal masses or calculi identified. RIGHT ureter decompressed. Bladder unremarkable; no air in bladder. Stomach/Bowel: Stomach decompressed. Large and small bowel loops unremarkable. Low lying cecum in pelvis. Appendix not visualized. Vascular/Lymphatic: Few pelvic phleboliths. Atherosclerotic calcifications aorta without aneurysm. Prominent LEFT para-aortic lymph nodes at the level of the kidney, largest 11 mm short axis image 34. Reproductive: Unremarkable uterus and ovaries Other: No free air or free fluid.  No hernia. Musculoskeletal: Osseous demineralization. IMPRESSION: LEFT hydronephrosis, hydroureter, and perinephric edema though no definite ureteral calculus is visualized. This could be due to a passed renal calculus. However, a single focus of gas is identified at the upper pole of the LEFT kidney, raising question that these findings could be due to emphysematous pyelonephritis rather than stone disease; recommend correlation with urinalysis. Repeat CT imaging could also be performed with IV contrast if urinalysis is nondiagnostic, which would allow for detection of ureteral enhancement and the typical patchy renal parenchymal enhancement of pyelonephritis. Findings called to Dietrich Pates PA on 09/09/2019 at 1843 hours. Electronically Signed: By: Ulyses Southward M.D. On: 09/09/2019 18:47    Labs:  CBC: Recent Labs    09/09/19 1639 09/10/19 0531  WBC 6.2 2.6*  HGB 9.6* 9.2*  HCT 29.5* 27.7*  PLT 200 122*    COAGS: Recent Labs    09/09/19 1639  INR 1.5*    BMP: Recent Labs    09/09/19 1639 09/10/19 0531  NA 131* 132*  K 3.3* 4.2  CL 101 107  CO2 19* 14*  GLUCOSE 205*  193*  BUN 31* 32*  CALCIUM 8.6* 7.0*  CREATININE 2.14* 2.30*  GFRNONAA 23* 21*  GFRAA 27* 24*    LIVER FUNCTION TESTS: Recent Labs    09/09/19 1639  BILITOT 0.9  AST 19  ALT 13  ALKPHOS 138*  PROT 7.3  ALBUMIN 3.4*    TUMOR MARKERS: No results for input(s): AFPTM, CEA, CA199, CHROMGRNA in the last 8760 hours.  Assessment and Plan: 68 y.o. female with past medical history of diabetes who presents to the ED now with left flank pain, chills as well as fever.  She was found to be hypotensive with elevated creatinine and lactic acid.  She is COVID-19 negative.  Subsequent imaging revealed left hydronephrosis with hydroureter and perinephric edema and mid left ureteral calculi noted just beyond the gonadal vein crossing along  with findings of left emphysematous pyelonephritis.  Blood and urine cultures pending.  Patient now intubated and on pressor support..  Request now received for emergent left percutaneous nephrostomy placement.  History of E. coli UTI in 2018.Risks and benefits of left PCN placement was discussed with the patient's son Rachel Nelson including, but not limited to, infection, bleeding, significant bleeding causing loss or decrease in renal function or damage to adjacent structures.   All of the patient's questions were answered, patient is agreeable to proceed.  Consent signed and in chart.      Thank you for this interesting consult.  I greatly enjoyed meeting Rachel Nelson and look forward to participating in their care.  A copy of this report was sent to the requesting provider on this date.  Electronically Signed: D. Jeananne RamaKevin Migel Hannis, PA-C 09/10/2019, 8:45 AM   I spent a total of  25 minutes   in face to face in clinical consultation, greater than 50% of which was counseling/coordinating care for left percutaneous nephrostomy

## 2019-09-10 NOTE — ED Provider Notes (Signed)
3:30 AM Patient is a 68 year old female admitted to the hospital service to stepdown for sepsis.  Initially hypotensive but this improved after 4 L of IV fluids.  Source appears to be emphysematous pyelonephritis and patient has received antibiotics.  Levophed did not have to be started in the ED.  I was called to bedside as patient was in respiratory distress.  Spoke with patient using Hindi interpreter and she states that she cannot breathe.  Denies any pain.  Decision made to intubate patient secondary to extreme agitation, increased work of breathing.  Sats were 100% on nonrebreather.  Intubated using 7.5 endotracheal tube.  Repeat chest x-ray and ABG ordered.  Critical care and hospitalist have been updated.   CRITICAL CARE Performed by: Pryor Curia   Total critical care time: 35 minutes  Critical care time was exclusive of separately billable procedures and treating other patients.  Critical care was necessary to treat or prevent imminent or life-threatening deterioration.  Critical care was time spent personally by me on the following activities: development of treatment plan with patient and/or surrogate as well as nursing, discussions with consultants, evaluation of patient's response to treatment, examination of patient, obtaining history from patient or surrogate, ordering and performing treatments and interventions, ordering and review of laboratory studies, ordering and review of radiographic studies, pulse oximetry and re-evaluation of patient's condition.    Procedure Name: Intubation Date/Time: 09/10/2019 3:40 AM Performed by: Chauntel Windsor, Delice Bison, DO Pre-anesthesia Checklist: Patient identified, Patient being monitored, Emergency Drugs available, Timeout performed and Suction available Oxygen Delivery Method: Non-rebreather mask Preoxygenation: Pre-oxygenation with 100% oxygen Induction Type: Rapid sequence Ventilation: Mask ventilation without difficulty Laryngoscope Size:  Glidescope and 3 Grade View: Grade I Tube size: 7.5 mm Number of attempts: 1 Placement Confirmation: ETT inserted through vocal cords under direct vision,  CO2 detector and Breath sounds checked- equal and bilateral         Sonam Huelsmann, Delice Bison, DO 09/10/19 0450

## 2019-09-10 NOTE — H&P (Signed)
History and Physical    Rachel Nelson QPY:[id number] DOB: [birth year] DOA: 09/09/2019  PCP: Merrilee Seashore, MD  Patient coming from: Home  I have personally briefly reviewed patient's old medical records in [place]  Chief Complaint: Flank pain  HPI: Rachel Nelson is a 68 y.o. female with medical history significant for type 2 diabetes who presents to the ED for evaluation of flank pain, malaise, and chills.  History is supplemented by son by phone.  Patient developed right-sided flank pain 2 days ago which radiated to her right abdomen.  She had associated nausea with decreased appetite.  Her son was going to take her to urgent care today when she suddenly became generally weak and was noted to be shivering.  He brought her to the ED for further evaluation.  She has a prior history of sepsis due to ESBL E. coli pyelonephritis for which she required admission in July 2018.  She denies any chest pain, dyspnea, cough, vomiting, diarrhea.  ED Course:  Initial vitals showed BP 122/106, pulse 132, RR 16, temp 102.0 Fahrenheit, SPO2 97% on room air.  Patient became hypotensive with blood pressures ranging from 12-45Y systolic and 09-98P diastolic.  Labs are notable for BUN 31, creatinine 2.14, sodium 131, potassium 3.3, bicarb 19, serum glucose 205, AST 19, ALT 13, alk phos 138, total bilirubin 0.9, lactic acid 3.1, WBC 6.2, hemoglobin 9.6, platelets 200,000.  Blood cultures were obtained and pending.  Urinalysis showed negative nitrates, moderate leukocytes, 11-20 RBCs, 6-10 WBCs, few bacteria on microscopy.  Urine culture was obtained and pending.  POC SARS CoV 2 antigen test was negative.  SARS-CoV-2 PCR test is collected and pending.  Portable chest x-ray showed mild bibasilar airspace disease felt to reflect atelectasis.  CT renal stone study showed left-sided hydronephrosis, hydroureter, and perinephric edema with no definite ureteral calculus visualized.  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  Patient was started on IV meropenem and vancomycin.  She was given 1 L LR and 3 L normal saline with stabilization of blood pressure.  Lactic acid improved to 1.7.  EDP discussed with on-call urology who recommended continued management with IV antibiotics, no further intervention from urology standpoint.  EDP also discussed with critical care who recommended continuing IV fluid hydration and admission to the medical service.  The hospitalist service was consulted for further evaluation management.  Review of Systems: All systems reviewed and are negative except as documented in history of present illness above.   Past Medical History:  Diagnosis Date  . Acute pyelonephritis 03/21/2017  . High cholesterol   . Renal insufficiency 03/21/2017  . Type II diabetes mellitus ([place])     Past Surgical History:  Procedure Laterality Date  . NO PAST SURGERIES      Social History:  reports that she has never smoked. She has never used smokeless tobacco. She reports that she does not drink alcohol or use drugs.  No Known Allergies  History reviewed. No pertinent family history.   Prior to Admission medications   Medication Sig Start Date End Date Taking? Authorizing Provider  acetaminophen (TYLENOL) 325 MG tablet Take 325 mg by mouth every 6 (six) hours as needed for mild pain or headache.   Yes [provider]  aspirin EC 81 MG tablet Take 81 mg by mouth daily.   Yes [provider]  atorvastatin (LIPITOR) 20 MG tablet Take 20 mg by mouth daily. 07/25/19  Yes [provider]  metFORMIN (GLUCOPHAGE-XR) 500 MG 24 hr tablet Take 500 mg by mouth 2 (two) times daily with a meal.   Yes [provider]  insulin glargine (LANTUS) 100 UNIT/ML injection Inject 0.1 mLs (10 Units total) into the skin at bedtime. Patient not taking: Reported on 09/09/2019 03/24/17   Oswald Hillock, MD    ondansetron (ZOFRAN) 4 MG tablet Take 1 tablet (4 mg total) by mouth every 6 (six) hours. Prn n/v. Patient not taking: Reported on 03/21/2017 07/27/16   Billy Fischer, MD  sulfamethoxazole-trimethoprim (BACTRIM DS,SEPTRA DS) 800-160 MG tablet Take 1 tablet by mouth 2 (two) times daily. Patient not taking: Reported on 09/09/2019 03/24/17   Oswald Hillock, MD    Physical Exam: Vitals:   09/09/19 2330 09/10/19 0000 09/10/19 0030 09/10/19 0041  BP: (!) 85/50 (!) 95/51 (!) 102/57 (!) 102/57  Pulse: (!) 101 100 (!) 102 (!) 102  Resp: (!) 26   (!) 26  Temp:    98.6 F (37 C)  TempSrc:    Oral  SpO2: 90% 92% 91% 93%    Constitutional: Resting supine in bed, NAD, calm, appears tired but comfortable Eyes: PERRL, lids and conjunctivae normal ENMT: Mucous membranes are dry. Posterior pharynx clear of any exudate or lesions. Neck: normal, supple, no masses. Respiratory: clear to auscultation bilaterally, no wheezing, no crackles. Normal respiratory effort. No accessory muscle use.  Cardiovascular: Tachycardic, no murmurs / rubs / gallops. No extremity edema. 2+ pedal pulses. Abdomen: no tenderness, no masses palpated. No hepatosplenomegaly. Bowel sounds positive.  Musculoskeletal: no clubbing / cyanosis. No joint deformity upper and lower extremities. Good ROM, no contractures. Normal muscle tone.  Skin: no rashes, lesions, ulcers. No induration Neurologic: CN 2-12 grossly intact. Sensation intact, Strength 5/5 in all 4.  Psychiatric: Normal judgment and insight. Alert and oriented x 3. Normal mood.     Labs on Admission: I have personally reviewed following labs and imaging studies  CBC: Recent Labs  Lab 09/09/19 1639  WBC 6.2  NEUTROABS 5.8  HGB 9.6*  HCT 29.5*  MCV 87.3  PLT 638   Basic Metabolic Panel: Recent Labs  Lab 09/09/19 1639  NA 131*  K 3.3*  CL 101  CO2 19*  GLUCOSE 205*  BUN 31*  CREATININE 2.14*  CALCIUM 8.6*   GFR: CrCl cannot be calculated (Unknown ideal  weight.). Liver Function Tests: Recent Labs  Lab 09/09/19 1639  AST 19  ALT 13  ALKPHOS 138*  BILITOT 0.9  PROT 7.3  ALBUMIN 3.4*   No results for input(s): LIPASE, AMYLASE in the last 168 hours. No results for input(s): AMMONIA in the last 168 hours. Coagulation Profile: Recent Labs  Lab 09/09/19 1639  INR 1.5*   Cardiac Enzymes: No results for input(s): CKTOTAL, CKMB, CKMBINDEX, TROPONINI in the last 168 hours. BNP (last 3 results) No results for input(s): PROBNP in the last 8760 hours. HbA1C: No results for input(s): HGBA1C in the last 72 hours. CBG: Recent Labs  Lab 09/09/19 1602  GLUCAP 213*   Lipid Profile: No results for input(s): CHOL, HDL, LDLCALC, TRIG, CHOLHDL, LDLDIRECT in the last 72 hours. Thyroid Function Tests: No results for input(s): TSH, T4TOTAL, FREET4, T3FREE, THYROIDAB in the last 72 hours. Anemia Panel: No results for input(s): VITAMINB12, FOLATE, FERRITIN, TIBC, IRON, RETICCTPCT in the last 72 hours. Urine analysis:    Component Value Date/Time   COLORURINE AMBER (A) 09/09/2019 1900   APPEARANCEUR CLOUDY (A) 09/09/2019 1900  LABSPEC 1.016 09/09/2019 1900   PHURINE 5.0 09/09/2019 1900   GLUCOSEU NEGATIVE 09/09/2019 1900   HGBUR MODERATE (A) 09/09/2019 1900   BILIRUBINUR NEGATIVE 09/09/2019 1900   KETONESUR NEGATIVE 09/09/2019 1900   PROTEINUR 100 (A) 09/09/2019 1900   NITRITE NEGATIVE 09/09/2019 1900   LEUKOCYTESUR MODERATE (A) 09/09/2019 1900    Radiological Exams on Admission: DG Chest Port 1 View  Result Date: 09/09/2019 CLINICAL DATA:  Generalized weakness and malaise. Back pain for 3 days. EXAM: PORTABLE CHEST 1 VIEW COMPARISON:  One-view chest x-ray 07/27/2016 FINDINGS: The heart size is normal. Atherosclerotic changes are noted at the aortic arch. Mild bibasilar airspace opacities are present, left greater than right. Upper lung fields are clear. Small left effusion is suspected. IMPRESSION: 1. Mild bibasilar airspace disease,  left greater than right. While this likely reflects atelectasis, infection is not excluded. 2. Probable small left pleural effusion. Electronically Signed   By: San Morelle M.D.   On: 09/09/2019 17:18   CT Renal Stone Study  Result Date: 09/09/2019 CLINICAL DATA:  Back and flank pain laterality not specified, question kidney stone disease EXAM: CT ABDOMEN AND PELVIS WITHOUT CONTRAST TECHNIQUE: Multidetector CT imaging of the abdomen and pelvis was performed following the standard protocol without IV contrast. Sagittal and coronal MPR images reconstructed from axial data set. No oral contrast administered. COMPARISON:  03/21/2018 FINDINGS: Lower chest: Bibasilar atelectasis versus infiltrate in lower lobes. Hepatobiliary: Gallbladder and liver normal appearance Pancreas: Normal appearance Spleen: Normal appearance Adrenals/Urinary Tract: Adrenal glands and RIGHT kidney normal appearance. LEFT hydronephrosis and hydroureter with perinephric edema. No definite ureteral calcification visualized. Single small focus of gas at upper pole of LEFT kidney. No renal masses or calculi identified. RIGHT ureter decompressed. Bladder unremarkable; no air in bladder. Stomach/Bowel: Stomach decompressed. Large and small bowel loops unremarkable. Low lying cecum in pelvis. Appendix not visualized. Vascular/Lymphatic: Few pelvic phleboliths. Atherosclerotic calcifications aorta without aneurysm. Prominent LEFT para-aortic lymph nodes at the level of the kidney, largest 11 mm short axis image 34. Reproductive: Unremarkable uterus and ovaries Other: No free air or free fluid.  No hernia. Musculoskeletal: Osseous demineralization. IMPRESSION: LEFT hydronephrosis, hydroureter, and perinephric edema though no definite ureteral calculus is visualized. This could be due to a passed renal calculus. However, a single focus of gas is identified at the upper pole of the LEFT kidney, raising question that these findings could be due  to emphysematous pyelonephritis rather than stone disease; recommend correlation with urinalysis. Repeat CT imaging could also be performed with IV contrast if urinalysis is nondiagnostic, which would allow for detection of ureteral enhancement and the typical patchy renal parenchymal enhancement of pyelonephritis. Findings called to Delia Heady PA on 09/09/2019 at 1843 hours. Electronically Signed   By: Lavonia Dana M.D.   On: 09/09/2019 18:47    EKG: Independently reviewed. Sinus tachycardia, no prior for comparison.  No acute ischemic changes.  Assessment/Plan Principal Problem:   Sepsis due to urinary tract infection ([place]) Active Problems:   Diabetes mellitus type II, non insulin dependent ([place])   Pyelonephritis of left kidney   Acute kidney injury ([place])  Rachel Nelson is a 68 y.o. female with medical history significant for type 2 diabetes who is admitted with sepsis due to pyelonephritis.  Sepsis due to left-sided pyelonephritis: Patient presenting with fever, hypotension, tachycardia, lactic acidosis, and CT imaging suggestive of left-sided emphysematous pyelonephritis and possible passed renal calculus.  Has history of ESBL E. coli UTI in July 2018. -Continue empiric IV meropenem -  Follow-up blood and urine cultures -Continue maintenance IV fluids for BP support overnight  Acute kidney injury due to sepsis: Continue maintenance IV fluids overnight as above.  Hold Metformin, avoid NSAIDs.  Type 2 diabetes: On metformin as an outpatient, will hold while in hospital and start sensitive SSI.  Hyperlipidemia: Continue atorvastatin.  DVT prophylaxis: Subcutaneous heparin Code Status: Full code Family Communication: Discussed with son Rachel Nelson by phone [phone] Disposition Plan: Likely discharge to home pending clinical progress Consults called: EDP discussed with on-call urology and PCCM Admission status: Inpatient, patient likely requires greater than 2 midnight length stay  for management of sepsis due to pyelonephritis as she requires continued IV antibiotics, IV fluids, and close monitoring as she is high risk for further decompensation while awaiting further cultural data.  Zada Finders MD [facility]  If 7PM-7AM, please contact night-coverage [email or web address]  09/10/2019, 12:53 AM

## 2019-09-10 NOTE — ED Notes (Signed)
30 etom in per provider order RSI kit  

## 2019-09-10 NOTE — ED Notes (Signed)
Spoke with hospitalist and was told to call ICU to get orders for vasopressors

## 2019-09-10 NOTE — Plan of Care (Signed)
Urology was consulted and recommended IR guided nephrostomy tube placement for immediate decompression.  IR was consulted and discussed with Dr. Kathlene Cote.  Patient is n.p.o. and will be going for procedure in the next 1 to 2 hours.

## 2019-09-10 NOTE — ED Notes (Signed)
Provider @ bedside  Rt paged

## 2019-09-10 NOTE — ED Notes (Signed)
TIME OUT provider/ nurse x3 / RT/ PA

## 2019-09-10 NOTE — ED Notes (Signed)
Spoke with ICU charge RN who spoke to critical care physician. Per critical care physician hang a bolus of NS and start vasopressin

## 2019-09-10 NOTE — ED Notes (Signed)
Pt transported to IR with this RN, transport, and respiratory tech.

## 2019-09-10 NOTE — ED Notes (Signed)
Called pharmacy for Vasopressin

## 2019-09-10 NOTE — Plan of Care (Signed)
Rachel Nelson by ED NP that patient hypotensive, after > 3-4L and w ns at 125/hr.   Pt likely has septic shock, from pyelonephritis.  Pt receive vanco iv along with meropenem iv in ED.    A/P Septic shock likely from pyelonephritis Cont Ns iv Start pressors 12 lead ekg Trop I q2h x2 Cortisol  Check cardiac echo PCCM consult requested, appreciate input

## 2019-09-10 NOTE — ED Notes (Signed)
Son of patient, emergency contact, Adeel Feldmeier would like an update on his mother. He said he didn't get one since she had the procedure today.

## 2019-09-10 NOTE — ED Notes (Signed)
100 succ per provider verbal order

## 2019-09-10 NOTE — ED Notes (Signed)
0349 OG placed

## 2019-09-10 NOTE — ED Notes (Signed)
0354 pt bucking  Restraints placed bilateral wrists per provider verbal

## 2019-09-10 NOTE — ED Notes (Signed)
Consult to Urology x 2, Intensivist,MD, Deatra Canter @05 :09am

## 2019-09-10 NOTE — Progress Notes (Signed)
  Echocardiogram 2D Echocardiogram has been performed.  Rachel Nelson 09/10/2019, 4:04 PM

## 2019-09-10 NOTE — ED Notes (Signed)
Made [facility] Res[facility] aware that O2 maintaining 86-87% on vent.

## 2019-09-10 NOTE — H&P (Signed)
NAME:  Rachel Nelson, MRN:  [id number], DOB:  August 02, 1951, LOS: 0 ADMISSION DATE:  09/09/2019, CONSULTATION DATE: 09/10/2019 REFERRING MD: Allena KatzPatel, CHIEF COMPLAINT: Left flank pain  Brief History   68 year old female admitted with left flank pain with CT showing hydronephrosis but no ureteral calculus.  Prior history of ESBL E. coli.  History of present illness   68 year old female with past medical history of type 2 diabetes who came to ED for flank pain radiating to the front on the left side with malaise.  Patient reports that the pain started suddenly yesterday and she was unable to eat or drink.  She denies any fever but does report some chills.  She denies any breathing difficulty, shortness of breath, cough, sputum or known exposure to COVID-19 patient.  She has a known history of ESBL E. coli in 2018.  In the ED her initial blood pressure was 122/106 and she was tachycardic with temperature 102.  Patient later became hypotensive with systolic in the 80s and 90s and was given 3 L Ringer's lactate with MAP staying barely above 65.  Patient was given another liter of Ringer lactate and eventually started on low-dose Levophed.  Her labs were significant for creatinine 2.14 with baseline of 1.23, sodium 131, potassium 3.3, bicarb 19, normal LFTs with lactic acid 3.1 which improved to 1.8, WBC 6.2.  Blood cultures and urine cultures are pending.  UA was suggestive of UTI.  COVID-19 antigen test was negative and PCR is pending.  CT renal stone study showed left-sided hydronephrosis, hydroureter and perinephric edema with no definite ureteral calculus.  Single focus of gas was also seen at the upper pole of the left kidney suggesting possibly emphysematous pyelonephritis.  Patient was given IV meropenem and a total of 4 L IV fluid.  This case was also discussed with urology who recommended medical management as there was no ureteral obstruction and no abscess to drain.  Patient was being admitted to  stepdown unit when she developed respiratory distress and was intubated with subsequent hypotension that continued to get worse, patient was started on Levophed and radiology updated their CT stone study report which shows obstructive stone causing hydronephrosis.  Past Medical History  Diabetes ESBL E. coli in 2018  Significant Hospital Events   Started on Levophed 09/10/2019  Consults:  Urology  Procedures:    Significant Diagnostic Tests:  SARS-CoV-2 antigen negative -12/28 SARS-CoV-2 PCR pending CT stone study: Left-sided hydronephrosis, hydroureter with ureteral calculus.  Single focus of gas on the upper pole of left kidney suggestive of emphysematous pyelonephritis.  Micro Data:  Blood cultures drawn Urine culture pending  Antimicrobials:  Meropenem 12/28 Vancomycin x112/28 then dosed by level  Interim history/subjective:    Objective   Blood pressure 107/72, pulse (!) 109, temperature 98.6 F (37 C), temperature source Oral, resp. rate (!) 34, height 5\' 5"  (1.651 m), weight 54 kg, SpO2 98 %.        Intake/Output Summary (Last 24 hours) at 09/10/2019 0301 Last data filed at 09/09/2019 [id number] Gross per 24 hour  Intake 2000 ml  Output --  Net 2000 ml   Filed Weights   09/10/19 0200  Weight: 54 kg    Examination: General: No acute distress subsequently intubated HENT: Dry oral mucosa, EOMI, trachea central Lungs: Clear to auscultation with no wheezing or crackles Cardiovascular: Tachycardic, S1 plus S2, +0 Abdomen: Soft nontender nondistended, no left flank tenderness or CVA tenderness Extremities: No lower extremity edema Neuro: Alert oriented x3, grossly  intact and nonfocal GU: Deferred  Resolved Hospital Problem list     Assessment & Plan:  68 year old female with past medical history of ESBL E. coli and diabetes who comes to emergency department with left flank pain, CT shows left hydronephrosis, hydroureter with ureteral calculus.  Suspicion of  emphysematous pyelonephritis due to a locule of gas.  Initially no urologic intervention was indicated as per urology, will discuss again in the light of updated radiology report showing obstructive stone as well as septic shock with escalating Levophed requirement.   Emphysematous pyelonephritis: Continue with meropenem and vancomycin by levels Radiology report states obstructing 15 mm stone in the left ureter, after discussion with urology will call IR for nephrostomy tube. Monitor urine output Follow urine culture and taper antibiotic as necessary, no drainable collection  Septic shock secondary to above: Continue with antibiotics and titrate Levophed to MAP more than 65 We will place central line for escalating doses of Levophed Status post 4 L IV fluid, will use IV fluid as needed instead of continuous  Diabetes mellitus: Continue basal bolus insulin   Best practice:  Diet: N.p.o. Pain/Anxiety/Delirium protocol (if indicated): Tylenol for pain as needed VAP protocol (if indicated): N/A DVT prophylaxis: Heparin x1, please restart in a.m. if no intervention is planned GI prophylaxis: N/A Glucose control: Basal bolus protocol Mobility: Out of bed to chair Code Status: Full code Family Communication: Discussed with son over the phone Disposition: Admit to ICU  Labs   CBC: Recent Labs  Lab 09/09/19 1639  WBC 6.2  NEUTROABS 5.8  HGB 9.6*  HCT 29.5*  MCV 87.3  PLT 200    Basic Metabolic Panel: Recent Labs  Lab 09/09/19 1639  NA 131*  K 3.3*  CL 101  CO2 19*  GLUCOSE 205*  BUN 31*  CREATININE 2.14*  CALCIUM 8.6*   GFR: Estimated Creatinine Clearance: 21.4 mL/min (A) (by C-G formula based on SCr of 2.14 mg/dL (H)). Recent Labs  Lab 09/09/19 1639 09/09/19 2000  WBC 6.2  --   LATICACIDVEN 3.1* 1.7    Liver Function Tests: Recent Labs  Lab 09/09/19 1639  AST 19  ALT 13  ALKPHOS 138*  BILITOT 0.9  PROT 7.3  ALBUMIN 3.4*   No results for input(s):  LIPASE, AMYLASE in the last 168 hours. No results for input(s): AMMONIA in the last 168 hours.  ABG No results found for: PHART, PCO2ART, PO2ART, HCO3, TCO2, ACIDBASEDEF, O2SAT   Coagulation Profile: Recent Labs  Lab 09/09/19 1639  INR 1.5*    Cardiac Enzymes: No results for input(s): CKTOTAL, CKMB, CKMBINDEX, TROPONINI in the last 168 hours.  HbA1C: No results found for: HGBA1C  CBG: Recent Labs  Lab 09/09/19 1602  GLUCAP 213*    Review of Systems:   A complete review of system was performed with pertinent positive and negative listed in HPI above, rest of the ROS was negative.  Past Medical History  She,  has a past medical history of Acute pyelonephritis (03/21/2017), High cholesterol, Renal insufficiency (03/21/2017), and Type II diabetes mellitus ([facility]).   Surgical History    Past Surgical History:  Procedure Laterality Date  . NO PAST SURGERIES       Social History   reports that she has never smoked. She has never used smokeless tobacco. She reports that she does not drink alcohol or use drugs.   Family History   Her family history is not on file.   Allergies No Known Allergies   Home Medications  Prior to Admission medications   Medication Sig Start Date End Date Taking? Authorizing Provider  acetaminophen (TYLENOL) 325 MG tablet Take 325 mg by mouth every 6 (six) hours as needed for mild pain or headache.   Yes [provider]  aspirin EC 81 MG tablet Take 81 mg by mouth daily.   Yes [provider]  atorvastatin (LIPITOR) 20 MG tablet Take 20 mg by mouth daily. 07/25/19  Yes [provider]  metFORMIN (GLUCOPHAGE-XR) 500 MG 24 hr tablet Take 500 mg by mouth 2 (two) times daily with a meal.   Yes [provider]  insulin glargine (LANTUS) 100 UNIT/ML injection Inject 0.1 mLs (10 Units total) into the skin at bedtime. Patient not taking: Reported on 09/09/2019 03/24/17   Oswald Hillock, MD  ondansetron (ZOFRAN) 4 MG  tablet Take 1 tablet (4 mg total) by mouth every 6 (six) hours. Prn n/v. Patient not taking: Reported on 03/21/2017 07/27/16   Billy Fischer, MD  sulfamethoxazole-trimethoprim (BACTRIM DS,SEPTRA DS) 800-160 MG tablet Take 1 tablet by mouth 2 (two) times daily. Patient not taking: Reported on 09/09/2019 03/24/17   Oswald Hillock, MD     Critical care time: 45 min excluding procedures

## 2019-09-10 NOTE — ED Notes (Signed)
Sedation end  Tube in @23  lip Yellow cap  Breathe sounds good equal  --------

## 2019-09-10 NOTE — Progress Notes (Addendum)
NAME:  Rachel Nelson, MRN:  [id number], DOB:  1951-08-25, LOS: 0 ADMISSION DATE:  09/09/2019, CONSULTATION DATE: 09/10/2019 REFERRING MD: Allena Katz, CHIEF COMPLAINT: Left flank pain  Brief History   68 y/o F admitted 12/27 with reports of left flank pain, chilld and malaise.  Hx of ESBL EColi in 2018.  Found to be in septic shock on admit with UA suggestive of UTI.  CT imaging showed left ureteral calculi with obstructing hydronephrosis, left emphysematous pyelitis. COVID negative.  The patient was initially admitted to SDU/TRH but decompensated in the ER with worsening hypotension, increased work of breathing requiring intubation & vasopressors.   Past Medical History  Diabetes ESBL E. coli in 2018  Significant Hospital Events   12/27 Admit  12/28 Decompensated with worsening hypotension, resp distress > intubated   Consults:  Urology  Procedures:  Left IR Perc drain for hydro 12/28 >> L IJ TLC 12/28 >>  ETT 12/28 >>  Significant Diagnostic Tests:  CT Renal Study 12/27 >> mid left ureteral calculi are noted just beyond the gonadal veing crossing, 90mm long segment of clustered calculi measuring up to 53mm in thickness, left emphysematous pyelitis, obstructing hydronephrosis on L ECHO 12/28 >>   Micro Data:  COVID 12/27 >> negative  UC 12/27 >>  BCx2 12/27 >>   Antimicrobials:  Meropenem 12/28 >> Vancomycin 12/28 >>   Interim history/subjective:  RN reports ongoing hypotension despite vasopressors.  Remains on propofol > reports became agitated in IR during procedure.   Tmax 103.8 / WBC down to 2.63 Glucose range - 157 to 213 (A1c 7.6) I/O - no UOP documented, total in 2L  Objective   Blood pressure (!) 102/50, pulse (!) 121, temperature (!) 102 F (38.9 C), resp. rate 17, height 5\' 5"  (1.651 m), weight 54 kg, SpO2 93 %.    Vent Mode: PRVC FiO2 (%):  [50 %-60 %] 60 % Set Rate:  [18 bmp-22 bmp] 18 bmp Vt Set:  [450 mL] 450 mL PEEP:  [5 cmH20] 5 cmH20 Plateau Pressure:   [19 cmH20-22 cmH20] 19 cmH20   Intake/Output Summary (Last 24 hours) at 09/10/2019 1248 Last data filed at 09/10/2019 09/12/2019 Gross per 24 hour  Intake 2190.39 ml  Output --  Net 2190.39 ml   Filed Weights   09/10/19 0200  Weight: 54 kg    Examination: General: small elderly adult female lying in bed in NAD on vent  HEENT: MM pink/moist, ETT Neuro: sedate, coughs with stimulation  CV: s1s2 RRR, ST on monitor, no m/r/g PULM:  Non-labored, lungs bilaterally clear GI: soft Extremities: warm/dry, no edema  Skin: no rashes or lesions  Resolved Hospital Problem list     Assessment & Plan:    Septic Shock secondary to Obstructive Uropathy with Hydronephrosis 71mm obstructing stone in left ureter.  Required IR perc drain 12/28. Hx ESBL E-Coli in 2018 -continue abx as above -follow cultures to maturity  -appreciate IR assistance with patient care -levophed, vasopressin for MAP >65 -stress dose steroids   Acute Hypoxemic Respiratory Failure  Bilateral interstitial opacities on CXR, ? ALI from sepsis  -PRVC 8cc/kg  -wean PEEP / FiO2 for sats >90% -follow intermittent CXR -daily SBT / WUA  -oral care   Need for Sedation secondary to Mechanical Ventilation  -PAD Protocol for RASS Goal 0 to -1 -Change to precedex given hypotension on propofol (may be unrelated / ie: sepsis) -fentanyl gtt for pain -delirium prevention measures  AKI  Metabolic / Lactic Acidosis  Hyponatremia  In setting of obstructive uropathy -1L sodium bicarbonate in sterile water at 166ml/hr then stop -Follow lactate, procalcitonin -Trend BMP / urinary output -Replace electrolytes as indicated -Avoid nephrotoxic agents, ensure adequate renal perfusion -now repeat labs  Pancytopenia  Secondary to septic shock -trend CBC  -transfuse as appropriate  -correct underlying infection   Troponin Leak  Suspected demand ischemia in setting of sepsis, r/o stress physiology on ECHO -trend troponin  -follow  EKG -assess ECHO  DM  -SSI Q4  -moderate scale   At Risk Malnutrition  -TF per Nutrition    Best practice:  Diet: NPO Pain/Anxiety/Delirium protocol (if indicated): Ordered VAP protocol (if indicated): Ordered DVT prophylaxis: Heparin sq GI prophylaxis: Pepcid Glucose control: SSI Mobility: BR, advance as tolerated  Code Status: Full code Family Communication: Family updated by admitting MD Disposition: ICU  Labs   CBC: Recent Labs  Lab 09/09/19 1639 09/10/19 0531  WBC 6.2 2.6*  NEUTROABS 5.8  --   HGB 9.6* 9.2*  HCT 29.5* 27.7*  MCV 87.3 89.9  PLT 200 122*    Basic Metabolic Panel: Recent Labs  Lab 09/09/19 1639 09/10/19 0531  NA 131* 132*  K 3.3* 4.2  CL 101 107  CO2 19* 14*  GLUCOSE 205* 193*  BUN 31* 32*  CREATININE 2.14* 2.30*  CALCIUM 8.6* 7.0*   GFR: Estimated Creatinine Clearance: 20 mL/min (A) (by C-G formula based on SCr of 2.3 mg/dL (H)). Recent Labs  Lab 09/09/19 1639 09/09/19 2000 09/10/19 0531 09/10/19 0536  WBC 6.2  --  2.6*  --   LATICACIDVEN 3.1* 1.7 4.1* 3.2*    Liver Function Tests: Recent Labs  Lab 09/09/19 1639  AST 19  ALT 13  ALKPHOS 138*  BILITOT 0.9  PROT 7.3  ALBUMIN 3.4*   No results for input(s): LIPASE, AMYLASE in the last 168 hours. No results for input(s): AMMONIA in the last 168 hours.  ABG    Component Value Date/Time   PHART 7.286 (L) 09/10/2019 1232   PCO2ART 28.0 (L) 09/10/2019 1232   PO2ART 60.2 (L) 09/10/2019 1232   HCO3 12.9 (L) 09/10/2019 1232   ACIDBASEDEF 12.2 (H) 09/10/2019 1232   O2SAT 88.2 09/10/2019 1232     Coagulation Profile: Recent Labs  Lab 09/09/19 1639  INR 1.5*    Cardiac Enzymes: No results for input(s): CKTOTAL, CKMB, CKMBINDEX, TROPONINI in the last 168 hours.  HbA1C: Hgb A1c MFr Bld  Date/Time Value Ref Range Status  09/10/2019 05:31 AM 7.6 (H) 4.8 - 5.6 % Final    Comment:    (NOTE) Pre diabetes:          5.7%-6.4% Diabetes:              >6.4% Glycemic  control for   <7.0% adults with diabetes     CBG: Recent Labs  Lab 09/09/19 1602 09/10/19 0302 09/10/19 0754  GLUCAP 213* 186* 157*      Critical care time: 30 minutes   Noe Gens, MSN, NP-C [facility] 09/10/2019, 12:48 PM   Please see [email or web address] for pager details.

## 2019-09-10 NOTE — Progress Notes (Signed)
Pharmacy Antibiotic Note  Rachel Nelson is a 68 y.o. female admitted on 09/09/2019 with sepsis from suspected pyelonephritis.  Pharmacy has been consulted for meropenem dosing.  Plan: Meropenem 1 gm IV q12h F/u scr/cultures     Temp (24hrs), Avg:99.5 F (37.5 C), Min:98.5 F (36.9 C), Max:102 F (38.9 C)  Recent Labs  Lab 09/09/19 1639 09/09/19 2000  WBC 6.2  --   CREATININE 2.14*  --   LATICACIDVEN 3.1* 1.7    CrCl cannot be calculated (Unknown ideal weight.).    No Known Allergies  Antimicrobials this admission: 12/27 meropenem >>   12/27 vancomycin >> x1  Dose adjustments this admission:   Microbiology results:  BCx:   UCx:    Sputum:    MRSA PCR:   Thank you for allowing pharmacy to be a part of this patient's care.  Lawana Pai R 09/10/2019 1:00 AM

## 2019-09-10 NOTE — Procedures (Signed)
Pre Procedure Dx: Hydronephrosis Post Procedural Dx: Same  Successful [place] and fluoroscopic guided placement of a left sided PCN with end coiled and locked within the renal pelvis. PCN connected to gravity bag.  Sample of purulent urine sent to the lab for analysis.   EBL: None Complications: None immediate.  Ronny Bacon, MD Pager #: [phone]

## 2019-09-10 NOTE — Progress Notes (Signed)
Called and updated son. Told him that acidemia keeping Mrs. Elsberry on vent and if kidneys don't start working soon may need to do HD.  Erskine Emery MD

## 2019-09-10 NOTE — ED Notes (Addendum)
Date and time results received: 09/10/19 0647 (use smartphrase ".now" to insert current time)  Test: lactic acid  Critical Value: 4.1  Name of Provider Notified: ali Md Orders Received? Or Actions Taken?: waiting on orders

## 2019-09-10 NOTE — Progress Notes (Signed)
Pharmacy Brief Note - Post-IR Anticoagulation Follow Up:  Procedure: Left sided PCN placement Anticoagulation: Heparin 5000 units subQ q8h for DVT ppx Bleed risk: Standard Timing of resuming HSQ: D0, at least 6 hours after procedure  Procedure ended ~9:30 this morning, resume HSQ at 1700 this evening. Pharmacy to sign off.   Lenis Noon, PharmD 09/10/19 1:36 PM

## 2019-09-11 ENCOUNTER — Inpatient Hospital Stay (HOSPITAL_COMMUNITY): Payer: BC Managed Care – PPO

## 2019-09-11 DIAGNOSIS — A499 Bacterial infection, unspecified: Secondary | ICD-10-CM

## 2019-09-11 DIAGNOSIS — R6521 Severe sepsis with septic shock: Secondary | ICD-10-CM

## 2019-09-11 DIAGNOSIS — J9601 Acute respiratory failure with hypoxia: Secondary | ICD-10-CM

## 2019-09-11 DIAGNOSIS — N12 Tubulo-interstitial nephritis, not specified as acute or chronic: Secondary | ICD-10-CM

## 2019-09-11 DIAGNOSIS — R809 Proteinuria, unspecified: Secondary | ICD-10-CM

## 2019-09-11 DIAGNOSIS — Z1612 Extended spectrum beta lactamase (ESBL) resistance: Secondary | ICD-10-CM

## 2019-09-11 DIAGNOSIS — E1129 Type 2 diabetes mellitus with other diabetic kidney complication: Secondary | ICD-10-CM

## 2019-09-11 LAB — BASIC METABOLIC PANEL
Anion gap: 17 — ABNORMAL HIGH (ref 5–15)
BUN: 42 mg/dL — ABNORMAL HIGH (ref 8–23)
CO2: 16 mmol/L — ABNORMAL LOW (ref 22–32)
Calcium: 6.5 mg/dL — ABNORMAL LOW (ref 8.9–10.3)
Chloride: 104 mmol/L (ref 98–111)
Creatinine, Ser: 2.96 mg/dL — ABNORMAL HIGH (ref 0.44–1.00)
GFR calc Af Amer: 18 mL/min — ABNORMAL LOW (ref >60)
GFR calc non Af Amer: 16 mL/min — ABNORMAL LOW (ref >60)
Glucose, Bld: 202 mg/dL — ABNORMAL HIGH (ref 70–99)
Potassium: 5 mmol/L (ref 3.5–5.1)
Sodium: 137 mmol/L (ref 135–145)

## 2019-09-11 LAB — BLOOD CULTURE ID PANEL (REFLEXED)

## 2019-09-11 LAB — PHOSPHORUS
Phosphorus: 5.1 mg/dL — ABNORMAL HIGH (ref 2.5–4.6)
Phosphorus: 4.1 mg/dL (ref 2.5–4.6)

## 2019-09-11 LAB — CBC
HCT: 23.7 % — ABNORMAL LOW (ref 36.0–46.0)
Hemoglobin: 7.8 g/dL — ABNORMAL LOW (ref 12.0–15.0)
MCH: 29.3 pg (ref 26.0–34.0)
MCHC: 32.9 g/dL (ref 30.0–36.0)
MCV: 89.1 fL (ref 80.0–100.0)
Platelets: 74 10*3/uL — ABNORMAL LOW (ref 150–400)
RBC: 2.66 MIL/uL — ABNORMAL LOW (ref 3.87–5.11)
RDW: 14.9 % (ref 11.5–15.5)
WBC: 24.4 10*3/uL — ABNORMAL HIGH (ref 4.0–10.5)
nRBC: 0.1 % (ref 0.0–0.2)

## 2019-09-11 LAB — MRSA PCR SCREENING: MRSA by PCR: NEGATIVE

## 2019-09-11 LAB — GLUCOSE, CAPILLARY
Glucose-Capillary: 177 mg/dL — ABNORMAL HIGH (ref 70–99)
Glucose-Capillary: 239 mg/dL — ABNORMAL HIGH (ref 70–99)
Glucose-Capillary: 255 mg/dL — ABNORMAL HIGH (ref 70–99)
Glucose-Capillary: 265 mg/dL — ABNORMAL HIGH (ref 70–99)
Glucose-Capillary: 333 mg/dL — ABNORMAL HIGH (ref 70–99)

## 2019-09-11 LAB — MAGNESIUM
Magnesium: 2.5 mg/dL — ABNORMAL HIGH (ref 1.7–2.4)
Magnesium: 2.6 mg/dL — ABNORMAL HIGH (ref 1.7–2.4)

## 2019-09-11 LAB — LACTIC ACID, PLASMA: Lactic Acid, Venous: 3.9 mmol/L (ref 0.5–1.9)

## 2019-09-11 LAB — PROCALCITONIN: Procalcitonin: >150 ng/mL

## 2019-09-11 LAB — TROPONIN I (HIGH SENSITIVITY): Troponin I (High Sensitivity): >27000 ng/L (ref <18)

## 2019-09-11 MED ORDER — PRO-STAT SUGAR FREE PO LIQD
30.0000 mL | Freq: Three times a day (TID) | ORAL | Status: DC
  Administered 2019-09-11 – 2019-09-12 (×2): 30 mL
  Filled 2019-09-11: qty 30

## 2019-09-11 MED ORDER — HEPARIN SODIUM (PORCINE) 5000 UNIT/ML IJ SOLN
5000.0000 [IU] | Freq: Three times a day (TID) | INTRAMUSCULAR | Status: DC
  Administered 2019-09-11 – 2019-09-12 (×3): 5000 [IU] via SUBCUTANEOUS
  Filled 2019-09-11 (×3): qty 1

## 2019-09-11 MED ORDER — CALCIUM GLUCONATE-NACL 1-0.675 GM/50ML-% IV SOLN
1.0000 g | Freq: Once | INTRAVENOUS | Status: AC
  Administered 2019-09-11: 1000 mg via INTRAVENOUS
  Filled 2019-09-11: qty 50

## 2019-09-11 MED ORDER — VITAL HIGH PROTEIN PO LIQD
1000.0000 mL | ORAL | Status: AC
  Administered 2019-09-11: 1000 mL

## 2019-09-11 NOTE — Progress Notes (Signed)
Initial Nutrition Assessment  DOCUMENTATION CODES:   Not applicable  INTERVENTION:  - will adjust TF regimen: Vital High Protein @ 60 ml/hr with 30 ml prostat TID. - this regimen will provide 1740 kcal (104% estimated kcal need), 171 grams protein, and 1204 ml free water. - free water flush, if desired, to be per MD/NP.    NUTRITION DIAGNOSIS:   Inadequate oral intake related to inability to eat as evidenced by NPO status.  GOAL:   Patient will meet greater than or equal to 90% of their needs  MONITOR:   Vent status, TF tolerance, Labs, Weight trends  REASON FOR ASSESSMENT:   Ventilator, Consult Enteral/tube feeding initiation and management  ASSESSMENT:   68 y.o. female with medical history significant for type 2 DM. She presented to the ED for flank pain, malaise, and chills which developed 2 days PTA. These symptoms were associated with nausea and decreased appetite.  Patient was intubated in the ED and then transferred to ICU. She remains intubated with OGT in place. She is receiving Vital High Protein @ 40 ml/hr with 30 ml prostat BID. This regimen is providing 1160 kcal, 114 grams protein, and 802 ml free water.   Patient's son is at bedside. He made breakfast for patient the day she presented to the hospital (12/27) and this is the last time he is aware of that she ate. Other than when she is feeling sick, patient has a great appetite and has no difficulties with chewing or swallowing at baseline. She does greatly enjoy milk and often drinks large quantities of milk each day (unable to obtain more detail on this).  Son does not feel that patient's weight has changed recently. He states that she is very active, does all ADLs, and will go for jogs with her family. Per chart review, current weight is 151 lb and weight yesterday was 148 lb. PTA, last documented weight was on 03/21/17 when she weighed 119 lb.   Estimated needs based on plan for CRRT start.   Per notes: -  septic shock - pyelonephritis - acute hypoxemic respiratory failure - AKI and worsening metabolic acidosis--plan to start CRRT   Patient is currently intubated on ventilator support MV: 7.8 L/min Temp (24hrs), Avg:100.2 F (37.9 C), Min:99.1 F (37.3 C), Max:101.7 F (38.7 C) Propofol: none BP: 125/69 and MAP: 88   Labs reviewed; CBGs: 177 and 239 mg/dl, BUN: 42 mg/dl, creatinine: 2.96 mg/dl, Ca: 6.5 mg/dl, Phos: 5.1 mg/dl, Mg: 2.5 mg/dl, GFR: 16 ml/min. Medications reviewed; 1 g Ca gluconate x1 run 12/29, 20 mg IV pepcid/day, 50 mg solu-cortef QID, sliding scale novolog, 4 g IV Mg sulfate x1 run 12/28. IVF; 150 mEq sodium bicarb-sterile water @ 100 ml/hr.  Drips; precedex @ 0.4 mcg/kg/hr, levo @ 6 mcg/min, fentanyl @ 50 mcg/hr.    NUTRITION - FOCUSED PHYSICAL EXAM:  completed; no muscle or fat wasting, no edema noted at this time.   Diet Order:   Diet Order            Diet NPO time specified  Diet effective now              EDUCATION NEEDS:   No education needs have been identified at this time  Skin:  Skin Assessment: Reviewed RN Assessment  Last BM:  12/29  Height:   Ht Readings from Last 1 Encounters:  09/10/19 5\' 5"  (1.651 m)    Weight:   Wt Readings from Last 1 Encounters:  09/11/19 68.5 kg  Ideal Body Weight:  56.8 kg  BMI:  Body mass index is 25.13 kg/m.  Estimated Nutritional Needs:   Kcal:  1664 kcal  Protein:  137-171 grams  Fluid:  >/= 2 L/day      Trenton Gammon, MS, RD, LDN, [facility] Inpatient Clinical Dietitian Pager # [phone] After hours/weekend pager # [phone]

## 2019-09-11 NOTE — Progress Notes (Signed)
Referring Physician(s): Ali,M/McKenzie,P   Supervising Physician: Ruel FavorsShick, Trevor  [facility]  [facility]  Chief Complaint:  Left emphysematous pyelonephritis  Subjective:  Pt remains intubated; on pressors; last temp 98.8 but febrile overnight  Allergies: Patient has no known allergies.  Medications: Prior to Admission medications   Medication Sig Start Date End Date Taking? Authorizing Provider  acetaminophen (TYLENOL) 325 MG tablet Take 325 mg by mouth every 6 (six) hours as needed for mild pain or headache.   Yes [provider]  aspirin EC 81 MG tablet Take 81 mg by mouth daily.   Yes [provider]  atorvastatin (LIPITOR) 20 MG tablet Take 20 mg by mouth daily. 07/25/19  Yes [provider]  metFORMIN (GLUCOPHAGE-XR) 500 MG 24 hr tablet Take 500 mg by mouth 2 (two) times daily with a meal.   Yes [provider]  insulin glargine (LANTUS) 100 UNIT/ML injection Inject 0.1 mLs (10 Units total) into the skin at bedtime. Patient not taking: Reported on 09/09/2019 03/24/17   Meredeth IdeLama, Gagan S, MD  ondansetron (ZOFRAN) 4 MG tablet Take 1 tablet (4 mg total) by mouth every 6 (six) hours. Prn n/v. Patient not taking: Reported on 03/21/2017 07/27/16   Linna HoffKindl, James D, MD  sulfamethoxazole-trimethoprim (BACTRIM DS,SEPTRA DS) 800-160 MG tablet Take 1 tablet by mouth 2 (two) times daily. Patient not taking: Reported on 09/09/2019 03/24/17   Meredeth IdeLama, Gagan S, MD     Vital Signs: BP 106/70    Pulse 89    Temp 98.8 F (37.1 C)    Resp 18    Ht 5\' 5"  (1.651 m)    Wt 151 lb 0.2 oz (68.5 kg)    SpO2 95%    BMI 25.13 kg/m   Physical Exam intubated; left PCN intact, dressing dry, OP 125 cc yesterday, 45 cc today turbid, tea colored urine  Imaging: DG Abdomen 1 View  Result Date: 09/10/2019 CLINICAL DATA:  NG tube placement. EXAM: ABDOMEN - 1 VIEW COMPARISON:  None. FINDINGS: The NG tube is in the body region of the stomach. The bowel gas pattern is  unremarkable. IMPRESSION: NG tube in good position in the body region of the stomach. Electronically Signed   By: Rudie MeyerP.  Gallerani M.D.   On: 09/10/2019 05:32   DG Chest Port 1 View  Result Date: 09/11/2019 CLINICAL DATA:  Respiratory failure. EXAM: PORTABLE CHEST 1 VIEW COMPARISON:  Radiograph yesterday. FINDINGS: Endotracheal tube tip 13 mm from the carina. Enteric tube in place, tip not included in the field of view below the diaphragm. Left internal jugular central venous catheter tip in the lower SVC. Progressive hazy opacities throughout both lungs, most prominent at the bases. No visualized pneumothorax. Unchanged heart size and mediastinal contours. Left nephrostomy tube partially included. IMPRESSION: 1. Endotracheal tube tip 13 mm from the carina. Enteric tube and left central line remain in place. 2. Progressive hazy opacities throughout both lungs, most prominent at the bases. Findings are suspicious for developing pulmonary edema and pleural effusions. Electronically Signed   By: Narda RutherfordMelanie  Sanford M.D.   On: 09/11/2019 05:59   DG Chest Portable 1 View  Result Date: 09/10/2019 CLINICAL DATA:  Central line placement EXAM: PORTABLE CHEST 1 VIEW COMPARISON:  Earlier today FINDINGS: Stable positioning of existing hardware. New left IJ line with tip at the upper cavoatrial junction. No visible pneumothorax. Stable interstitial coarsening with a few Kerley lines, the patient has received 4 L of IVF. No visible effusion. Normal heart  size. IMPRESSION: New central line without complicating feature. Stable interstitial opacity, likely mild edema. Electronically Signed   By: Monte Fantasia M.D.   On: 09/10/2019 07:02   DG Chest Portable 1 View  Result Date: 09/10/2019 CLINICAL DATA:  Intubation EXAM: PORTABLE CHEST 1 VIEW COMPARISON:  Yesterday FINDINGS: Endotracheal tube tip is just below the clavicular heads. The enteric tube at least reaches the stomach. Normal heart size. Prominent vascular pedicle  widening without bronchus depression or apical capping. This is attributed to supine positioning as there was no paratracheal thickening on comparison upright film. There are a few Kerley lines in generalized interstitial coarsening. No effusion or pneumothorax. IMPRESSION: 1. Unremarkable hardware positioning. 2. Mild interstitial opacity that could be atelectasis or mild edema. Electronically Signed   By: Monte Fantasia M.D.   On: 09/10/2019 04:16   DG Chest Port 1 View  Result Date: 09/09/2019 CLINICAL DATA:  Generalized weakness and malaise. Back pain for 3 days. EXAM: PORTABLE CHEST 1 VIEW COMPARISON:  One-view chest x-ray 07/27/2016 FINDINGS: The heart size is normal. Atherosclerotic changes are noted at the aortic arch. Mild bibasilar airspace opacities are present, left greater than right. Upper lung fields are clear. Small left effusion is suspected. IMPRESSION: 1. Mild bibasilar airspace disease, left greater than right. While this likely reflects atelectasis, infection is not excluded. 2. Probable small left pleural effusion. Electronically Signed   By: San Morelle M.D.   On: 09/09/2019 17:18   ECHOCARDIOGRAM COMPLETE  Result Date: 09/10/2019   ECHOCARDIOGRAM REPORT   Patient Name:   Rachel Nelson Date of Exam: 09/10/2019 Medical Rec #:  [id number]    Height:       65.0 in Accession #:    [id number]   Weight:       119.0 lb Date of Birth:  Feb 12, 1951    BSA:          1.59 m Patient Age:    5 years     BP:           118/35 mmHg Patient Gender: F            HR:           117 bpm. Exam Location:  Inpatient Procedure: 2D Echo, Cardiac Doppler and Color Doppler Indications:    Hypotension  History:        Patient has no prior history of Echocardiogram examinations.                 Risk Factors:Diabetes. Septic shock, elevated troponin, resp.                 failure, kidney failure.  Sonographer:    Dustin Flock Referring Phys: [place]  1. Left ventricular ejection  fraction, by visual estimation, is 60 to 65%. The left ventricle has normal function. There is no left ventricular hypertrophy.  2. Left ventricular diastolic parameters are consistent with Grade I diastolic dysfunction (impaired relaxation).  3. The left ventricle has no regional wall motion abnormalities.  4. Global right ventricle has normal systolic function.The right ventricular size is normal. No increase in right ventricular wall thickness.  5. Left atrial size was normal.  6. Right atrial size was normal.  7. The mitral valve is abnormal. Mild mitral valve regurgitation.  8. The tricuspid valve is grossly normal.  9. The aortic valve is tricuspid. Aortic valve regurgitation is not visualized. 10. The pulmonic valve was grossly normal. Pulmonic valve regurgitation is not visualized.  11. Moderately elevated pulmonary artery systolic pressure. 12. The inferior vena cava is dilated in size with <50% respiratory variability, suggesting right atrial pressure of 15 mmHg. FINDINGS  Left Ventricle: Left ventricular ejection fraction, by visual estimation, is 60 to 65%. The left ventricle has normal function. The left ventricle has no regional wall motion abnormalities. There is no left ventricular hypertrophy. Left ventricular diastolic parameters are consistent with Grade I diastolic dysfunction (impaired relaxation). Indeterminate filling pressures. Right Ventricle: The right ventricular size is normal. No increase in right ventricular wall thickness. Global RV systolic function is has normal systolic function. The tricuspid regurgitant velocity is 2.72 m/s, and with an assumed right atrial pressure  of 15 mmHg, the estimated right ventricular systolic pressure is moderately elevated at 44.6 mmHg. Left Atrium: Left atrial size was normal in size. Right Atrium: Right atrial size was normal in size Pericardium: There is no evidence of pericardial effusion. Mitral Valve: The mitral valve is abnormal. There is mild  thickening of the mitral valve leaflet(s). Mild mitral valve regurgitation. Tricuspid Valve: The tricuspid valve is grossly normal. Tricuspid valve regurgitation is trivial. Aortic Valve: The aortic valve is tricuspid. Aortic valve regurgitation is not visualized. Aortic valve peak gradient measures 14.0 mmHg. Pulmonic Valve: The pulmonic valve was grossly normal. Pulmonic valve regurgitation is not visualized. Pulmonic regurgitation is not visualized. Aorta: The aortic root and ascending aorta are structurally normal, with no evidence of dilitation. Venous: The inferior vena cava is dilated in size with less than 50% respiratory variability, suggesting right atrial pressure of 15 mmHg. IAS/Shunts: No atrial level shunt detected by color flow Doppler.  LEFT VENTRICLE PLAX 2D LVIDd:         4.30 cm  Diastology LVIDs:         2.90 cm  LV e' lateral:   10.10 cm/s LV PW:         0.90 cm  LV E/e' lateral: 11.8 LV IVS:        1.00 cm  LV e' medial:    10.90 cm/s LVOT diam:     1.70 cm  LV E/e' medial:  10.9 LV SV:         51 ml LV SV Index:   32.41 LVOT Area:     2.27 cm  RIGHT VENTRICLE RV Basal diam:  2.00 cm RV S prime:     12.00 cm/s TAPSE (M-mode): 2.5 cm LEFT ATRIUM           Index       RIGHT ATRIUM           Index LA diam:      3.00 cm 1.89 cm/m  RA Area:     10.60 cm LA Vol (A2C): 31.4 ml 19.79 ml/m RA Volume:   23.40 ml  14.75 ml/m LA Vol (A4C): 23.5 ml 14.81 ml/m  AORTIC VALVE AV Area (Vmax): 1.46 cm AV Vmax:        187.00 cm/s AV Peak Grad:   14.0 mmHg LVOT Vmax:      120.00 cm/s LVOT Vmean:     81.400 cm/s LVOT VTI:       0.188 m  AORTA Ao Root diam: 2.80 cm MITRAL VALVE                         TRICUSPID VALVE MV Area (PHT): 6.32 cm              TR Peak grad:  29.6 mmHg MV PHT:        34.80 msec            TR Vmax:        293.00 cm/s MV Decel Time: 120 msec MV E velocity: 119.00 cm/s 103 cm/s  SHUNTS MV A velocity: 94.10 cm/s  70.3 cm/s Systemic VTI:  0.19 m MV E/A ratio:  1.26        1.5        Systemic Diam: 1.70 cm  Kenneth Hilty MD EZoila Shutterlly signed by Zoila Shutter MD Signature Date/Time: 09/10/2019/5:12:23 PM    Final    CT Renal Stone Study  Addendum Date: 09/10/2019   ADDENDUM REPORT: 09/10/2019 04:34 ADDENDUM: Case reviewed in conjunction with post intubation chest x-ray. Mid left ureteral calculi are noted just beyond the gonadal vein crossing - there is a 15 mm long segment of clustered calculi measuring up to 7 mm in thickness. Left emphysematous pyelitis as noted previously. These results were called by telephone at the time of interpretation on 09/10/2019 at 4:34 am to provider Dr. Karie Mainland, who verbally acknowledged these results. Electronically Signed   By: Marnee Spring M.D.   On: 09/10/2019 04:34   Result Date: 09/10/2019 CLINICAL DATA:  Back and flank pain laterality not specified, question kidney stone disease EXAM: CT ABDOMEN AND PELVIS WITHOUT CONTRAST TECHNIQUE: Multidetector CT imaging of the abdomen and pelvis was performed following the standard protocol without IV contrast. Sagittal and coronal MPR images reconstructed from axial data set. No oral contrast administered. COMPARISON:  03/21/2018 FINDINGS: Lower chest: Bibasilar atelectasis versus infiltrate in lower lobes. Hepatobiliary: Gallbladder and liver normal appearance Pancreas: Normal appearance Spleen: Normal appearance Adrenals/Urinary Tract: Adrenal glands and RIGHT kidney normal appearance. LEFT hydronephrosis and hydroureter with perinephric edema. No definite ureteral calcification visualized. Single small focus of gas at upper pole of LEFT kidney. No renal masses or calculi identified. RIGHT ureter decompressed. Bladder unremarkable; no air in bladder. Stomach/Bowel: Stomach decompressed. Large and small bowel loops unremarkable. Low lying cecum in pelvis. Appendix not visualized. Vascular/Lymphatic: Few pelvic phleboliths. Atherosclerotic calcifications aorta without aneurysm. Prominent LEFT para-aortic  lymph nodes at the level of the kidney, largest 11 mm short axis image 34. Reproductive: Unremarkable uterus and ovaries Other: No free air or free fluid.  No hernia. Musculoskeletal: Osseous demineralization. IMPRESSION: LEFT hydronephrosis, hydroureter, and perinephric edema though no definite ureteral calculus is visualized. This could be due to a passed renal calculus. However, a single focus of gas is identified at the upper pole of the LEFT kidney, raising question that these findings could be due to emphysematous pyelonephritis rather than stone disease; recommend correlation with urinalysis. Repeat CT imaging could also be performed with IV contrast if urinalysis is nondiagnostic, which would allow for detection of ureteral enhancement and the typical patchy renal parenchymal enhancement of pyelonephritis. Findings called to Dietrich Pates PA on 09/09/2019 at 1843 hours. Electronically Signed: By: Ulyses Southward M.D. On: 09/09/2019 18:47   IR NEPHROSTOMY PLACEMENT LEFT  Result Date: 09/10/2019 INDICATION: Patient admitted with urosepsis found to have obstructing left-sided ureteral stone. Request made for placement of image guided left-sided percutaneous nephrostomy catheter for infection source control purposes. EXAM: 1. ULTRASOUND GUIDANCE FOR PUNCTURE OF THE LEFT RENAL COLLECTING SYSTEM 2. LEFT PERCUTANEOUS NEPHROSTOMY TUBE PLACEMENT. COMPARISON:  CT abdomen and pelvis - 09/10/2019 MEDICATIONS: Patient admitted to the hospital receiving intravenous antibiotics; the antibiotic was administered in an appropriate time frame prior to skin puncture. ANESTHESIA/SEDATION: None, the patient is  currently intubated CONTRAST:  15 cc Omnipaque 300 administered into the collecting system FLUOROSCOPY TIME:  1 minute, 6 seconds (13 mGy) COMPLICATIONS: None immediate. PROCEDURE: The procedure, risks, benefits, and alternatives were explained to the patient's son. Questions regarding the procedure were encouraged and  answered. The patient's son understands and consents to the procedure. A timeout was performed prior to the initiation of the procedure. The left flank region was prepped with Betadine in a sterile fashion, and a sterile drape was applied covering the operative field. A sterile gown and sterile gloves were used for the procedure. Local anesthesia was provided with 1% Lidocaine with epinephrine. Ultrasound was used to localize the left kidney. Under direct ultrasound guidance, a 21 gauge needle was advanced into the renal collecting system. An ultrasound image documentation was performed. Access within the collecting system was confirmed with the efflux of urine followed by contrast injection. Over a Nitrex wire, the tract was dilated with an Accustick stent. Next, a short Amplatz wire was coiled in the left renal collecting system. Under intermittent fluoroscopic guidance, the track was dilated ultimately allowing placement of a 10-French percutaneous nephrostomy catheter with end coiled and locked within left renal pelvis. Contrast was injected and several sport radiographs were obtained in various obliquities confirming access. A small sample of purulent appearing aspirated urine was capped and sent laboratory analysis. The catheter was secured at the skin with a Prolene retention suture and a gravity bag was placed. A dressing was placed. The patient tolerated procedure well without immediate postprocedural complication. FINDINGS: Ultrasound scanning demonstrates a moderately dilated collecting system as demonstrated on preceding abdominal CT. Under direct ultrasound guidance, a posterior inferior calix was targeted allowing advancement of an 10-French percutaneous nephrostomy catheter under intermittent fluoroscopic guidance. Contrast injection confirmed appropriate positioning. IMPRESSION: 1. Successful ultrasound and fluoroscopic guided placement of a left sided 10 French PCN. 2. Small sample of aspirated  purulent appearing urine was capped and sent to the laboratory for analysis. Electronically Signed   By: Simonne Come M.D.   On: 09/10/2019 09:35    Labs:  CBC: Recent Labs    09/09/19 1639 09/10/19 0531 09/10/19 1355 09/11/19 0400  WBC 6.2 2.6* 14.8* 24.4*  HGB 9.6* 9.2* 7.5* 7.8*  HCT 29.5* 27.7* 23.2* 23.7*  PLT 200 122* 115* 74*    COAGS: Recent Labs    09/09/19 1639  INR 1.5*    BMP: Recent Labs    09/09/19 1639 09/10/19 0531 09/10/19 1355 09/11/19 0400  NA 131* 132* 135 137  K 3.3* 4.2 3.6 5.0  CL 101 107 108 104  CO2 19* 14* 14* 16*  GLUCOSE 205* 193* 166* 202*  BUN 31* 32* 34* 42*  CALCIUM 8.6* 7.0* 6.7* 6.5*  CREATININE 2.14* 2.30* 2.54* 2.96*  GFRNONAA 23* 21* 19* 16*  GFRAA 27* 24* 22* 18*    LIVER FUNCTION TESTS: Recent Labs    09/09/19 1639  BILITOT 0.9  AST 19  ALT 13  ALKPHOS 138*  PROT 7.3  ALBUMIN 3.4*    Assessment and Plan: Pt with history of urosepsis/left emphysematous pyelonephritis/nephrolithiasis; status post left percutaneous nephrostomy on 12/28; LA 3.9, creat 2.96(2.54), K 5.0, WBC 24.4(14.8), hgb 7.8(7.5), urine cx- e coli; cont PCN, flush cath tid, monitor output/labs closely; antbx per primary team;urology/nephrology consulted.   Electronically Signed: D. Jeananne Rama, PA-C 09/11/2019, 4:07 PM   I spent a total of 15 minutes at the the patient's bedside AND on the patient's hospital floor or unit, greater than  50% of which was counseling/coordinating care for left nephrostomy    Patient ID: Rachel Nelson, female   DOB: 1951-06-30, 68 y.o.   MRN: [id number]

## 2019-09-11 NOTE — Progress Notes (Addendum)
NAME:  Rachel Nelson, MRN:  [id number], DOB:  [birth year], LOS: 1 ADMISSION DATE:  09/09/2019, CONSULTATION DATE: 09/10/2019 REFERRING MD: Posey Pronto, CHIEF COMPLAINT: Left flank pain  Brief History   69 y/o F admitted 12/27 with reports of left flank pain, chills and malaise.  Hx of ESBL E Coli in 2018.  Found to be in septic shock on admit with UA suggestive of UTI.  CT imaging showed left ureteral calculi with obstructing hydronephrosis, left emphysematous pyelitis. COVID negative.  The patient was initially admitted to SDU/TRH but decompensated in the ER with worsening hypotension, increased work of breathing requiring intubation & vasopressors.   Past Medical History  Diabetes ESBL E. coli in 2018  [place] [facility] Events   12/27 Admit  12/28 Decompensated with worsening hypotension, resp distress > intubated   Consults:  Urology  Procedures:  Left IR Perc drain for hydro 12/28 >> L IJ TLC 12/28 >>  ETT 12/28 >>  Significant Diagnostic Tests:  CT Renal Study 12/27 >> mid left ureteral calculi are noted just beyond the gonadal veing crossing, 71mm long segment of clustered calculi measuring up to 18mm in thickness, left emphysematous pyelitis, obstructing hydronephrosis on L ECHO 12/28 >> Left Ventricle: Left ventricular ejection fraction, by visual estimation, is 60 to 65%. The left ventricle has normal function. The left ventricle has no regional wall motion abnormalities. There is no left ventricular hypertrophy. Left ventricular diastolic parameters are consistent with Grade I diastolic dysfunction (impaired relaxation). Indeterminate filling pressures. Right Ventricle: The right ventricular size is normal. No increase in right ventricular wall thickness. Global RV systolic function is has normal systolic function. The tricuspid regurgitant velocity is 2.72 m/s, and with an assumed right atrial pressure of 15 mmHg, the estimated right ventricular systolic pressure is  moderately elevated at 44.6 mmHg. Left Atrium: Left atrial size was normal in size. Right Atrium: Right atrial size was normal in size Pericardium: T                                 here is no evidence of pericardial effusion.  Micro Data:  COVID 12/27 >> negative  UC 12/27 >> >=100,000 COLONIES/mL GRAM NEGATIVE RODS  BCx2 12/27 >> E coli and Enterobacteriae  Antimicrobials:  Meropenem 12/28 >> Vancomycin 12/28 >>   Interim history/subjective:  Continued hypotension despite vasopressors.  Remains on precedex.  Tmax 103.8 / WBC 24.4 Glucose range - 177-239 (A1c 7.6) I/O -  UOP documented 45 cc's since midnight>> + 7 L Blood Cultures + for E coli and Enterobacteriae  Objective   Blood pressure (!) 115/57, pulse 87, temperature 100.2 F (37.9 C), resp. rate 19, height 5\' 5"  (1.651 m), weight 68.5 kg, SpO2 95 %.    Vent Mode: PRVC FiO2 (%):  [40 %-60 %] 40 % Set Rate:  [18 bmp] 18 bmp Vt Set:  [450 mL] 450 mL PEEP:  [5 cmH20] 5 cmH20 Plateau Pressure:  [12 cmH20-23 cmH20] 23 cmH20   Intake/Output Summary (Last 24 hours) at 09/11/2019 0910 Last data filed at 09/11/2019 0800 Gross per 24 hour  Intake 6299.5 ml  Output 920 ml  Net 5379.5 ml   Filed Weights   09/10/19 0200 09/10/19 1400 09/11/19 0500  Weight: 54 kg 67.4 kg 68.5 kg    Examination: General: small elderly adult female lying in bed agonal respirations on CPAP HEENT: ETT secure and intact, MM pink/moist, OG Neuro: sedate, coughs with  stimulation , arouses to touch, no focus or track CV: s1s2 RRR, SR on monitor, no m/r/g PULM: Bilateral chest excursion,  Non-labored, lungs bilaterally clear, diminished per bases GI: soft, NT, ND, BS diminished Extremities: warm/dry, no edema, brisk refill Skin: no rashes or lesions, warm dry and intact  Resolved Hospital Problem list     Assessment & Plan:    Septic Shock secondary to Obstructive Uropathy with Hydronephrosis BC + for E Coli and Enterobacteriae PCT >  150 WBC 24.000 15mm obstructing stone in left ureter.  Required IR perc drain 12/28. Hx ESBL E-Coli in 2018 -continue abx as above>>  -follow cultures to maturity  -appreciate IR assistance with patient care -levophed, vasopressin for MAP >65 -stress dose steroids   Acute Hypoxemic Respiratory Failure CXR 12/29 with Progressive hazy opacities throughout both lungs, most prominent at the bases. Findings are suspicious for developing pulmonary edema and pleural effusions.  Weaning on 8/5>> agonal appearing respirations , good sats and volumes  ? ALI from sepsis  -PRVC 8cc/kg  -wean PEEP / FiO2 for sats >90% - trend CXR -daily SBT / WUA  -oral care   Need for Sedation secondary to Mechanical Ventilation  -PAD Protocol for RASS Goal 0 to -1 -Change to precedex given hypotension on propofol (may be unrelated / ie: sepsis) -fentanyl gtt for pain -delirium prevention measures  AKI  Metabolic / Lactic Acidosis  Hyponatremia >> resolving Potassium 5.0 Phos 5.1 Poor UO In setting of obstructive uropathy -1L sodium bicarbonate in sterile water at 15400ml/hr then stop -Follow lactate, procalcitonin -Trend BMP / urinary output -Replace electrolytes as indicated -Avoid nephrotoxic agents, ensure adequate renal perfusion - Renal consulted 12/29 - Consider katexelate  Pancytopenia  Platelets have dropped overnight 12/29 to 74,000 Secondary to septic shock -trend CBC  -transfuse as appropriate  -correct underlying infection   - SCD's as prophylaxis - If platelets drop below 50,000 consider stopping sq heparin  Troponin Leak  Suspected demand ischemia in setting of sepsis, r/o stress physiology on ECHO -trend troponin  -follow EKG -ECHO>> EF 60-65%  DM  -SSI Q4  -moderate scale   At Risk Malnutrition  -TF per Nutrition    Best practice:  Diet: NPO Pain/Anxiety/Delirium protocol (if indicated): Precedex VAP protocol (if indicated): Ordered DVT prophylaxis: Heparin  sq GI prophylaxis: Pepcid Glucose control: SSI Mobility: BR, advance as tolerated  Code Status: Full code Family Communication: Family updated at bedside 12/29 Disposition: ICU  Labs   CBC: Recent Labs  Lab 09/09/19 1639 09/10/19 0531 09/10/19 1355 09/11/19 0400  WBC 6.2 2.6* 14.8* 24.4*  NEUTROABS 5.8  --   --   --   HGB 9.6* 9.2* 7.5* 7.8*  HCT 29.5* 27.7* 23.2* 23.7*  MCV 87.3 89.9 89.6 89.1  PLT 200 122* 115* 74*    Basic Metabolic Panel: Recent Labs  Lab 09/09/19 1639 09/10/19 0531 09/10/19 1355 09/10/19 1846 09/11/19 0400  NA 131* 132* 135  --  137  K 3.3* 4.2 3.6  --  5.0  CL 101 107 108  --  104  CO2 19* 14* 14*  --  16*  GLUCOSE 205* 193* 166*  --  202*  BUN 31* 32* 34*  --  42*  CREATININE 2.14* 2.30* 2.54*  --  2.96*  CALCIUM 8.6* 7.0* 6.7*  --  6.5*  MG  --   --  1.3* 2.6* 2.5*  PHOS  --   --  2.0* 3.9 5.1*   GFR: Estimated Creatinine Clearance: 17.7  mL/min (A) (by C-G formula based on SCr of 2.96 mg/dL (H)). Recent Labs  Lab 09/09/19 1639 09/09/19 2000 09/10/19 0531 09/10/19 0536 09/10/19 1355 09/11/19 0400  PROCALCITON  --   --   --   --  >150.00 >150.00  WBC 6.2  --  2.6*  --  14.8* 24.4*  LATICACIDVEN 3.1* 1.7 4.1* 3.2*  --   --     Liver Function Tests: Recent Labs  Lab 09/09/19 1639  AST 19  ALT 13  ALKPHOS 138*  BILITOT 0.9  PROT 7.3  ALBUMIN 3.4*   No results for input(s): LIPASE, AMYLASE in the last 168 hours. No results for input(s): AMMONIA in the last 168 hours.  ABG    Component Value Date/Time   PHART 7.286 (L) 09/10/2019 1232   PCO2ART 28.0 (L) 09/10/2019 1232   PO2ART 60.2 (L) 09/10/2019 1232   HCO3 12.9 (L) 09/10/2019 1232   ACIDBASEDEF 12.2 (H) 09/10/2019 1232   O2SAT 88.2 09/10/2019 1232     Coagulation Profile: Recent Labs  Lab 09/09/19 1639  INR 1.5*    Cardiac Enzymes: No results for input(s): CKTOTAL, CKMB, CKMBINDEX, TROPONINI in the last 168 hours.  HbA1C: Hgb A1c MFr Bld  Date/Time  Value Ref Range Status  09/10/2019 05:31 AM 7.6 (H) 4.8 - 5.6 % Final    Comment:    (NOTE) Pre diabetes:          5.7%-6.4% Diabetes:              >6.4% Glycemic control for   <7.0% adults with diabetes     CBG: Recent Labs  Lab 09/10/19 1533 09/10/19 1933 09/10/19 2334 09/11/19 0323 09/11/19 0756  GLUCAP 156* 180* 160* 177* 239*      Critical care time: 45 minutes   Bevelyn Ngo, MSN, AGACNP-BC [facility] Pulmonary/Critical Care Medicine Pager # [phone] After 4 pm please call [phone] 09/11/2019, 9:10 AM   Please see [email or web address] for pager details.

## 2019-09-11 NOTE — Consult Note (Signed)
Renal Service Consult Note [facility]  Rachel Nelson 09/11/2019 Sol [place] Requesting Physician:  Dr Shearon Stalls  Reason for Consult:  AKI HPI: The patient is a 68 y.o. year-old presented on 12/27 w/ fatigue and malaise w/ flank and back pain x 3 days. W/u showed left hydro ureter/ nephrosis and emphysematous pyelitis w/o calculous seen. Pt was given IV abx and fluids but declined into septic shock requiring intubation and ICU care. Repeat CT read showed question of ureteral stone and patient was taken for L PCN placement by IR.  IR placed L PCN 12/28 am and purulent urine was sent to lab.  Pt is now anuric in shock on pressors. Creat 2.9. CXR diffuse infiltrates bilat, asked to see for AKI.    Creat in 2018 here was 1.1- 1.3 baseline. No other results noted.     ROS n/a, on vent sedated   Past Medical History  Past Medical History:  Diagnosis Date  . Acute pyelonephritis 03/21/2017  . High cholesterol   . Renal insufficiency 03/21/2017  . Type II diabetes mellitus ([place])    Past Surgical History  Past Surgical History:  Procedure Laterality Date  . IR NEPHROSTOMY PLACEMENT LEFT  09/10/2019  . NO PAST SURGERIES     Family History History reviewed. No pertinent family history. Social History  reports that she has never smoked. She has never used smokeless tobacco. She reports that she does not drink alcohol or use drugs. Allergies No Known Allergies Home medications Prior to Admission medications   Medication Sig Start Date End Date Taking? Authorizing Provider  acetaminophen (TYLENOL) 325 MG tablet Take 325 mg by mouth every 6 (six) hours as needed for mild pain or headache.   Yes [provider]  aspirin EC 81 MG tablet Take 81 mg by mouth daily.   Yes [provider]  atorvastatin (LIPITOR) 20 MG tablet Take 20 mg by mouth daily. 07/25/19  Yes [provider]  metFORMIN (GLUCOPHAGE-XR) 500 MG 24 hr tablet Take 500 mg by mouth 2  (two) times daily with a meal.   Yes [provider]  insulin glargine (LANTUS) 100 UNIT/ML injection Inject 0.1 mLs (10 Units total) into the skin at bedtime. Patient not taking: Reported on 09/09/2019 03/24/17   Oswald Hillock, MD  ondansetron (ZOFRAN) 4 MG tablet Take 1 tablet (4 mg total) by mouth every 6 (six) hours. Prn n/v. Patient not taking: Reported on 03/21/2017 07/27/16   Billy Fischer, MD  sulfamethoxazole-trimethoprim (BACTRIM DS,SEPTRA DS) 800-160 MG tablet Take 1 tablet by mouth 2 (two) times daily. Patient not taking: Reported on 09/09/2019 03/24/17   Oswald Hillock, MD   Liver Function Tests Recent Labs  Lab 09/09/19 1639  AST 19  ALT 13  ALKPHOS 138*  BILITOT 0.9  PROT 7.3  ALBUMIN 3.4*   No results for input(s): LIPASE, AMYLASE in the last 168 hours. CBC Recent Labs  Lab 09/09/19 1639 09/10/19 0531 09/10/19 1355 09/11/19 0400  WBC 6.2 2.6* 14.8* 24.4*  NEUTROABS 5.8  --   --   --   HGB 9.6* 9.2* 7.5* 7.8*  HCT 29.5* 27.7* 23.2* 23.7*  MCV 87.3 89.9 89.6 89.1  PLT 200 122* 115* 74*   Basic Metabolic Panel Recent Labs  Lab 09/09/19 1639 09/10/19 0531 09/10/19 1355 09/10/19 1846 09/11/19 0400  NA 131* 132* 135  --  137  K 3.3* 4.2 3.6  --  5.0  CL 101 107 108  --  104  CO2 19* 14* 14*  --  16*  GLUCOSE 205* 193* 166*  --  202*  BUN 31* 32* 34*  --  42*  CREATININE 2.14* 2.30* 2.54*  --  2.96*  CALCIUM 8.6* 7.0* 6.7*  --  6.5*  PHOS  --   --  2.0* 3.9 5.1*   Iron/TIBC/Ferritin/ %Sat    Component Value Date/Time   IRON 14 (L) 03/22/2017 0549   TIBC 167 (L) 03/22/2017 0549   FERRITIN 305 03/22/2017 0549   IRONPCTSAT 8 (L) 03/22/2017 0549    Vitals:   09/11/19 1315 09/11/19 1330 09/11/19 1345 09/11/19 1400  BP: 117/64 125/69 108/64 (!) 105/58  Pulse: 90 91 87 91  Resp: 18 18 18 18   Temp: 99.3 F (37.4 C) 99.1 F (37.3 C) 99.1 F (37.3 C) 99 F (37.2 C)  TempSrc:      SpO2: 95% 95% 96% 96%  Weight:      Height:         Exam Gen on vent, responds to voice, ETT No rash, cyanosis or gangrene Sclera anicteric  No jvd or bruits Chest clear bilat, no rales/ wheezing RRR no MRG Abd soft ntnd no mass or ascites +bs GU defer MS no joint effusions or deformity Ext trace LE edema, no wounds or ulcers Neuro is alert, Ox 3 , nf    Home meds:  - aspirin 81/ bactrim DS bid  - insulin glargine 10u hs/ metformin xr 500 bid  -  prn's/ vitamins/ supplements   UA 12/27 > 11-20 rbc/ 6-10 wbc, 0-5 epi, few bact, 100 prot  Renal stone CT 12/27 > IMPRESSION: LEFT hydronephrosis, hydroureter, and perinephric edema though no definite ureteral calculus is visualized. This could be due to a passed renal calculus. However, a single focus of gas is identified at the upper pole of the LEFT kidney, raising question that these findings could be due to emphysematous pyelonephritis rather than stone disease   CXR 12/29 - persistent diffuse bilat infiltrates c/w edema vs infection/ aspiration   Na 137  K 5.0  BUN 42  Cr 2.96  CO2 16   CA 6.5  Alb 3.4 yest  eGFR 17  WBC  2K > 24k today  Hb 7.8  plt 74    Assessment/ Plan: 1. AKI - unclear baseline, last creat here in 2018 was 1.2.  Prob ATN in setting of acute septic shock w/ L sided complicated pyelonephritis. No UOP now. Recommend supportive care for now, bicarb gtt.  No strong indication for RRT yet. Will follow closely.  Hopefully w/ abx/ relief of urinary obstruction septic picture will improve. 2. L sided pyelo/ hydronephrosis - sp L PCN on 12/28, IV abx 3. Septic shock - was on 2 pressors, now is on 1 4. DM2 5. VDRF - per CCM 6. Anemia - tranfuse prn      Kelly Splinter  MD 09/11/2019, 3:06 PM

## 2019-09-11 NOTE — Progress Notes (Signed)
PHARMACY - PHYSICIAN COMMUNICATION CRITICAL VALUE ALERT - BLOOD CULTURE IDENTIFICATION (BCID)  Results for orders placed or performed during the hospital encounter of 09/09/19  Blood Culture ID Panel (Reflexed) (Collected: 09/09/2019  4:43 PM)  Result Value Ref Range   Enterococcus species NOT DETECTED NOT DETECTED   Listeria monocytogenes NOT DETECTED NOT DETECTED   Staphylococcus species NOT DETECTED NOT DETECTED   Staphylococcus aureus (BCID) NOT DETECTED NOT DETECTED   Streptococcus species NOT DETECTED NOT DETECTED   Streptococcus agalactiae NOT DETECTED NOT DETECTED   Streptococcus pneumoniae NOT DETECTED NOT DETECTED   Streptococcus pyogenes NOT DETECTED NOT DETECTED   Acinetobacter baumannii NOT DETECTED NOT DETECTED   Enterobacteriaceae species DETECTED (A) NOT DETECTED   Enterobacter cloacae complex NOT DETECTED NOT DETECTED   Escherichia coli DETECTED (A) NOT DETECTED   Klebsiella oxytoca NOT DETECTED NOT DETECTED   Klebsiella pneumoniae NOT DETECTED NOT DETECTED   Proteus species NOT DETECTED NOT DETECTED   Serratia marcescens NOT DETECTED NOT DETECTED   Carbapenem resistance NOT DETECTED NOT DETECTED   Haemophilus influenzae NOT DETECTED NOT DETECTED   Neisseria meningitidis NOT DETECTED NOT DETECTED   Pseudomonas aeruginosa NOT DETECTED NOT DETECTED   Candida albicans NOT DETECTED NOT DETECTED   Candida glabrata NOT DETECTED NOT DETECTED   Candida krusei NOT DETECTED NOT DETECTED   Candida parapsilosis NOT DETECTED NOT DETECTED   Candida tropicalis NOT DETECTED NOT DETECTED   Currently receiving meropenem 1 gram IV q12h. History of prior ESBL-producing E.Coli UTI is noted. KPC (carbapenamase) was NOT detected on BCID, but no data yet on whether ESBL-producing isolate.  Based on the above information, anticipate current antimicrobial regimen is likely to cover the current isolate  Name of physician (or Provider) Contacted: Eric Form, NP  Changes to prescribed  antibiotics required: None at present  Clayburn Pert, PharmD, BCPS 09/11/2019  8:14 AM

## 2019-09-11 NOTE — Progress Notes (Addendum)
CRITICAL VALUE ALERT  Critical Value:  Troponin >27,000  Date & Time Notied: 09/11/2019 1238  Provider Notified: N. Desai   Orders Received/Actions taken: EKG reviewed from this AM, no changes to rhythm, patient denies chest pain. Will repeat EKG on 12/30.   MD attributes increase to septic response.

## 2019-09-12 ENCOUNTER — Encounter: Payer: Self-pay | Admitting: Pulmonary Disease

## 2019-09-12 ENCOUNTER — Inpatient Hospital Stay (HOSPITAL_COMMUNITY): Payer: BC Managed Care – PPO

## 2019-09-12 DIAGNOSIS — Z794 Long term (current) use of insulin: Secondary | ICD-10-CM

## 2019-09-12 DIAGNOSIS — E1100 Type 2 diabetes mellitus with hyperosmolarity without nonketotic hyperglycemic-hyperosmolar coma (NKHHC): Secondary | ICD-10-CM

## 2019-09-12 LAB — URINE CULTURE
Culture: >=100000 — AB
Special Requests: NORMAL

## 2019-09-12 LAB — GLUCOSE, CAPILLARY
Glucose-Capillary: 137 mg/dL — ABNORMAL HIGH (ref 70–99)
Glucose-Capillary: 145 mg/dL — ABNORMAL HIGH (ref 70–99)
Glucose-Capillary: 146 mg/dL — ABNORMAL HIGH (ref 70–99)
Glucose-Capillary: 159 mg/dL — ABNORMAL HIGH (ref 70–99)
Glucose-Capillary: 163 mg/dL — ABNORMAL HIGH (ref 70–99)
Glucose-Capillary: 165 mg/dL — ABNORMAL HIGH (ref 70–99)
Glucose-Capillary: 194 mg/dL — ABNORMAL HIGH (ref 70–99)
Glucose-Capillary: 196 mg/dL — ABNORMAL HIGH (ref 70–99)
Glucose-Capillary: 206 mg/dL — ABNORMAL HIGH (ref 70–99)
Glucose-Capillary: 233 mg/dL — ABNORMAL HIGH (ref 70–99)
Glucose-Capillary: 235 mg/dL — ABNORMAL HIGH (ref 70–99)
Glucose-Capillary: 269 mg/dL — ABNORMAL HIGH (ref 70–99)
Glucose-Capillary: 284 mg/dL — ABNORMAL HIGH (ref 70–99)
Glucose-Capillary: 301 mg/dL — ABNORMAL HIGH (ref 70–99)
Glucose-Capillary: 336 mg/dL — ABNORMAL HIGH (ref 70–99)
Glucose-Capillary: 351 mg/dL — ABNORMAL HIGH (ref 70–99)
Glucose-Capillary: 358 mg/dL — ABNORMAL HIGH (ref 70–99)
Glucose-Capillary: 398 mg/dL — ABNORMAL HIGH (ref 70–99)

## 2019-09-12 LAB — CBC WITH DIFFERENTIAL/PLATELET
Abs Immature Granulocytes: 0.14 10*3/uL — ABNORMAL HIGH (ref 0.00–0.07)
Basophils Absolute: 0 10*3/uL (ref 0.0–0.1)
Basophils Relative: 0 %
Eosinophils Absolute: 0 10*3/uL (ref 0.0–0.5)
Eosinophils Relative: 0 %
HCT: 20.6 % — ABNORMAL LOW (ref 36.0–46.0)
Hemoglobin: 7.1 g/dL — ABNORMAL LOW (ref 12.0–15.0)
Immature Granulocytes: 1 %
Lymphocytes Relative: 6 %
Lymphs Abs: 0.9 10*3/uL (ref 0.7–4.0)
MCH: 29.1 pg (ref 26.0–34.0)
MCHC: 34.5 g/dL (ref 30.0–36.0)
MCV: 84.4 fL (ref 80.0–100.0)
Monocytes Absolute: 0.5 10*3/uL (ref 0.1–1.0)
Monocytes Relative: 3 %
Neutro Abs: 14.6 10*3/uL — ABNORMAL HIGH (ref 1.7–7.7)
Neutrophils Relative %: 90 %
Platelets: 48 10*3/uL — ABNORMAL LOW (ref 150–400)
RBC: 2.44 MIL/uL — ABNORMAL LOW (ref 3.87–5.11)
RDW: 14.4 % (ref 11.5–15.5)
WBC: 16.2 10*3/uL — ABNORMAL HIGH (ref 4.0–10.5)
nRBC: 0.2 % (ref 0.0–0.2)

## 2019-09-12 LAB — PROCALCITONIN: Procalcitonin: 95.15 ng/mL

## 2019-09-12 LAB — PHOSPHORUS: Phosphorus: 3.1 mg/dL (ref 2.5–4.6)

## 2019-09-12 LAB — COMPREHENSIVE METABOLIC PANEL
ALT: 144 U/L — ABNORMAL HIGH (ref 0–44)
AST: 207 U/L — ABNORMAL HIGH (ref 15–41)
Albumin: 2 g/dL — ABNORMAL LOW (ref 3.5–5.0)
Alkaline Phosphatase: 84 U/L (ref 38–126)
Anion gap: 12 (ref 5–15)
BUN: 68 mg/dL — ABNORMAL HIGH (ref 8–23)
CO2: 27 mmol/L (ref 22–32)
Calcium: 6.4 mg/dL — CL (ref 8.9–10.3)
Chloride: 100 mmol/L (ref 98–111)
Creatinine, Ser: 2.58 mg/dL — ABNORMAL HIGH (ref 0.44–1.00)
GFR calc Af Amer: 21 mL/min — ABNORMAL LOW (ref >60)
GFR calc non Af Amer: 18 mL/min — ABNORMAL LOW (ref >60)
Glucose, Bld: 416 mg/dL — ABNORMAL HIGH (ref 70–99)
Potassium: 2.9 mmol/L — ABNORMAL LOW (ref 3.5–5.1)
Sodium: 139 mmol/L (ref 135–145)
Total Bilirubin: 1.2 mg/dL (ref 0.3–1.2)
Total Protein: 5.4 g/dL — ABNORMAL LOW (ref 6.5–8.1)

## 2019-09-12 LAB — TROPONIN I (HIGH SENSITIVITY): Troponin I (High Sensitivity): 14705 ng/L (ref <18)

## 2019-09-12 LAB — MAGNESIUM: Magnesium: 2.7 mg/dL — ABNORMAL HIGH (ref 1.7–2.4)

## 2019-09-12 MED ORDER — POTASSIUM CHLORIDE 20 MEQ/15ML (10%) PO SOLN
40.0000 meq | Freq: Once | ORAL | Status: AC
  Administered 2019-09-12: 40 meq
  Filled 2019-09-12: qty 30

## 2019-09-12 MED ORDER — VITAL AF 1.2 CAL PO LIQD: 1000.0000 mL | ORAL | Status: DC

## 2019-09-12 MED ORDER — INSULIN REGULAR(HUMAN) IN NACL 100-0.9 UT/100ML-% IV SOLN
INTRAVENOUS | Status: DC
  Administered 2019-09-12: 11.5 [IU]/h via INTRAVENOUS
  Filled 2019-09-12 (×2): qty 100

## 2019-09-12 MED ORDER — SODIUM CHLORIDE 0.9 % IV SOLN: INTRAVENOUS | Status: DC

## 2019-09-12 MED ORDER — INSULIN REGULAR(HUMAN) IN NACL 100-0.9 UT/100ML-% IV SOLN
INTRAVENOUS | Status: DC
  Administered 2019-09-12: 13 [IU]/h via INTRAVENOUS
  Administered 2019-09-13: 6.5 [IU]/h via INTRAVENOUS
  Filled 2019-09-12 (×2): qty 100

## 2019-09-12 MED ORDER — HEPARIN SODIUM (PORCINE) 5000 UNIT/ML IJ SOLN
5000.0000 [IU] | Freq: Three times a day (TID) | INTRAMUSCULAR | Status: DC
  Administered 2019-09-13 – 2019-09-19 (×20): 5000 [IU] via SUBCUTANEOUS
  Filled 2019-09-12 (×20): qty 1

## 2019-09-12 MED ORDER — DEXTROSE-NACL 5-0.45 % IV SOLN: INTRAVENOUS | Status: DC

## 2019-09-12 MED ORDER — DEXTROSE 50 % IV SOLN: 0.0000 mL | INTRAVENOUS | Status: DC | PRN

## 2019-09-12 MED ORDER — VITAL AF 1.2 CAL PO LIQD
1000.0000 mL | ORAL | Status: DC
  Administered 2019-09-12 – 2019-09-13 (×2): 1000 mL

## 2019-09-12 NOTE — Progress Notes (Signed)
Referring Physician(s): Ali,M/McKenzie,P  Supervising Physician: Oley Balm  Patient Status:  [facility] - In-pt  Chief Complaint:  Left emphysematous pyelonephritis  Subjective: Patient remains intubated, on small dose pressor; currently afebrile   Allergies: Patient has no known allergies.  Medications: Prior to Admission medications   Medication Sig Start Date End Date Taking? Authorizing Provider  acetaminophen (TYLENOL) 325 MG tablet Take 325 mg by mouth every 6 (six) hours as needed for mild pain or headache.   Yes [provider]  aspirin EC 81 MG tablet Take 81 mg by mouth daily.   Yes [provider]  atorvastatin (LIPITOR) 20 MG tablet Take 20 mg by mouth daily. 07/25/19  Yes [provider]  metFORMIN (GLUCOPHAGE-XR) 500 MG 24 hr tablet Take 500 mg by mouth 2 (two) times daily with a meal.   Yes [provider]  insulin glargine (LANTUS) 100 UNIT/ML injection Inject 0.1 mLs (10 Units total) into the skin at bedtime. Patient not taking: Reported on 09/09/2019 03/24/17   Meredeth Ide, MD  ondansetron (ZOFRAN) 4 MG tablet Take 1 tablet (4 mg total) by mouth every 6 (six) hours. Prn n/v. Patient not taking: Reported on 03/21/2017 07/27/16   Linna Hoff, MD  sulfamethoxazole-trimethoprim (BACTRIM DS,SEPTRA DS) 800-160 MG tablet Take 1 tablet by mouth 2 (two) times daily. Patient not taking: Reported on 09/09/2019 03/24/17   Meredeth Ide, MD     Vital Signs: BP 110/61   Pulse 86   Temp 98.4 F (36.9 C)   Resp (!) 21   Ht  (1.651 m)   Wt 158 lb 4.6 oz (71.8 kg)   SpO2 95%   BMI 26.34 kg/m   Physical Exam intubated, left percutaneous nephrostomy tube intact, dressing clean and dry, output  90 cc yesterday, 80 cc today yellow urine  Imaging: DG Abdomen 1 View  Result Date: 09/10/2019 CLINICAL DATA:  NG tube placement. EXAM: ABDOMEN - 1 VIEW COMPARISON:  None. FINDINGS: The NG tube is in the body region of the stomach.  The bowel gas pattern is unremarkable. IMPRESSION: NG tube in good position in the body region of the stomach. Electronically Signed   By: Rudie Meyer M.D.   On: 09/10/2019 05:32   DG CHEST PORT 1 VIEW  Result Date: 09/12/2019 CLINICAL DATA:  Respiratory failure. EXAM: PORTABLE CHEST 1 VIEW COMPARISON:  September 11, 2019. FINDINGS: Stable cardiomegaly. Endotracheal and nasogastric tubes are unchanged in position. No pneumothorax is noted. Left internal jugular catheter is unchanged in position. Stable bibasilar opacities are noted concerning for atelectasis or edema with associated pleural effusions. Bony thorax is unremarkable. IMPRESSION: Stable support apparatus. Stable bibasilar opacities are noted concerning for atelectasis or edema with associated pleural effusions. Electronically Signed   By: Lupita Raider M.D.   On: 09/12/2019 07:25   DG Chest Port 1 View  Result Date: 09/11/2019 CLINICAL DATA:  Respiratory failure. EXAM: PORTABLE CHEST 1 VIEW COMPARISON:  Radiograph yesterday. FINDINGS: Endotracheal tube tip 13 mm from the carina. Enteric tube in place, tip not included in the field of view below the diaphragm. Left internal jugular central venous catheter tip in the lower SVC. Progressive hazy opacities throughout both lungs, most prominent at the bases. No visualized pneumothorax. Unchanged heart size and mediastinal contours. Left nephrostomy tube partially included. IMPRESSION: 1. Endotracheal tube tip 13 mm from the carina. Enteric tube and left central line remain in place. 2. Progressive hazy opacities throughout both lungs, most prominent  at the bases. Findings are suspicious for developing pulmonary edema and pleural effusions. Electronically Signed   By: Keith Rake M.D.   On: 09/11/2019 05:59   DG Chest Portable 1 View  Result Date: 09/10/2019 CLINICAL DATA:  Central line placement EXAM: PORTABLE CHEST 1 VIEW COMPARISON:  Earlier today FINDINGS: Stable positioning of  existing hardware. New left IJ line with tip at the upper cavoatrial junction. No visible pneumothorax. Stable interstitial coarsening with a few Kerley lines, the patient has received 4 L of IVF. No visible effusion. Normal heart size. IMPRESSION: New central line without complicating feature. Stable interstitial opacity, likely mild edema. Electronically Signed   By: Monte Fantasia M.D.   On: 09/10/2019 07:02   DG Chest Portable 1 View  Result Date: 09/10/2019 CLINICAL DATA:  Intubation EXAM: PORTABLE CHEST 1 VIEW COMPARISON:  Yesterday FINDINGS: Endotracheal tube tip is just below the clavicular heads. The enteric tube at least reaches the stomach. Normal heart size. Prominent vascular pedicle widening without bronchus depression or apical capping. This is attributed to supine positioning as there was no paratracheal thickening on comparison upright film. There are a few Kerley lines in generalized interstitial coarsening. No effusion or pneumothorax. IMPRESSION: 1. Unremarkable hardware positioning. 2. Mild interstitial opacity that could be atelectasis or mild edema. Electronically Signed   By: Monte Fantasia M.D.   On: 09/10/2019 04:16   DG Chest Port 1 View  Result Date: 09/09/2019 CLINICAL DATA:  Generalized weakness and malaise. Back pain for 3 days. EXAM: PORTABLE CHEST 1 VIEW COMPARISON:  One-view chest x-ray 07/27/2016 FINDINGS: The heart size is normal. Atherosclerotic changes are noted at the aortic arch. Mild bibasilar airspace opacities are present, left greater than right. Upper lung fields are clear. Small left effusion is suspected. IMPRESSION: 1. Mild bibasilar airspace disease, left greater than right. While this likely reflects atelectasis, infection is not excluded. 2. Probable small left pleural effusion. Electronically Signed   By: San Morelle M.D.   On: 09/09/2019 17:18   ECHOCARDIOGRAM COMPLETE  Result Date: 09/10/2019   ECHOCARDIOGRAM REPORT   Patient Name:    Rachel Nelson Date of Exam: 09/10/2019 Medical Rec #:  [id number]    Height:       65.0 in Accession #:    [id number]   Weight:       119.0 lb Date of Birth:  08-Feb-1951    BSA:          1.59 m Patient Age:    39 years     BP:           118/35 mmHg Patient Gender: F            HR:           117 bpm. Exam Location:  Inpatient Procedure: 2D Echo, Cardiac Doppler and Color Doppler Indications:    Hypotension  History:        Patient has no prior history of Echocardiogram examinations.                 Risk Factors:Diabetes. Septic shock, elevated troponin, resp.                 failure, kidney failure.  Sonographer:    Dustin Flock Referring Phys: [place]  1. Left ventricular ejection fraction, by visual estimation, is 60 to 65%. The left ventricle has normal function. There is no left ventricular hypertrophy.  2. Left ventricular diastolic parameters are consistent with Grade I diastolic  dysfunction (impaired relaxation).  3. The left ventricle has no regional wall motion abnormalities.  4. Global right ventricle has normal systolic function.The right ventricular size is normal. No increase in right ventricular wall thickness.  5. Left atrial size was normal.  6. Right atrial size was normal.  7. The mitral valve is abnormal. Mild mitral valve regurgitation.  8. The tricuspid valve is grossly normal.  9. The aortic valve is tricuspid. Aortic valve regurgitation is not visualized. 10. The pulmonic valve was grossly normal. Pulmonic valve regurgitation is not visualized. 11. Moderately elevated pulmonary artery systolic pressure. 12. The inferior vena cava is dilated in size with <50% respiratory variability, suggesting right atrial pressure of 15 mmHg. FINDINGS  Left Ventricle: Left ventricular ejection fraction, by visual estimation, is 60 to 65%. The left ventricle has normal function. The left ventricle has no regional wall motion abnormalities. There is no left ventricular hypertrophy. Left  ventricular diastolic parameters are consistent with Grade I diastolic dysfunction (impaired relaxation). Indeterminate filling pressures. Right Ventricle: The right ventricular size is normal. No increase in right ventricular wall thickness. Global RV systolic function is has normal systolic function. The tricuspid regurgitant velocity is 2.72 m/s, and with an assumed right atrial pressure  of 15 mmHg, the estimated right ventricular systolic pressure is moderately elevated at 44.6 mmHg. Left Atrium: Left atrial size was normal in size. Right Atrium: Right atrial size was normal in size Pericardium: There is no evidence of pericardial effusion. Mitral Valve: The mitral valve is abnormal. There is mild thickening of the mitral valve leaflet(s). Mild mitral valve regurgitation. Tricuspid Valve: The tricuspid valve is grossly normal. Tricuspid valve regurgitation is trivial. Aortic Valve: The aortic valve is tricuspid. Aortic valve regurgitation is not visualized. Aortic valve peak gradient measures 14.0 mmHg. Pulmonic Valve: The pulmonic valve was grossly normal. Pulmonic valve regurgitation is not visualized. Pulmonic regurgitation is not visualized. Aorta: The aortic root and ascending aorta are structurally normal, with no evidence of dilitation. Venous: The inferior vena cava is dilated in size with less than 50% respiratory variability, suggesting right atrial pressure of 15 mmHg. IAS/Shunts: No atrial level shunt detected by color flow Doppler.  LEFT VENTRICLE PLAX 2D LVIDd:         4.30 cm  Diastology LVIDs:         2.90 cm  LV e' lateral:   10.10 cm/s LV PW:         0.90 cm  LV E/e' lateral: 11.8 LV IVS:        1.00 cm  LV e' medial:    10.90 cm/s LVOT diam:     1.70 cm  LV E/e' medial:  10.9 LV SV:         51 ml LV SV Index:   32.41 LVOT Area:     2.27 cm  RIGHT VENTRICLE RV Basal diam:  2.00 cm RV S prime:     12.00 cm/s TAPSE (M-mode): 2.5 cm LEFT ATRIUM           Index       RIGHT ATRIUM           Index  LA diam:      3.00 cm 1.89 cm/m  RA Area:     10.60 cm LA Vol (A2C): 31.4 ml 19.79 ml/m RA Volume:   23.40 ml  14.75 ml/m LA Vol (A4C): 23.5 ml 14.81 ml/m  AORTIC VALVE AV Area (Vmax): 1.46 cm AV Vmax:  187.00 cm/s AV Peak Grad:   14.0 mmHg LVOT Vmax:      120.00 cm/s LVOT Vmean:     81.400 cm/s LVOT VTI:       0.188 m  AORTA Ao Root diam: 2.80 cm MITRAL VALVE                         TRICUSPID VALVE MV Area (PHT): 6.32 cm              TR Peak grad:   29.6 mmHg MV PHT:        34.80 msec            TR Vmax:        293.00 cm/s MV Decel Time: 120 msec MV E velocity: 119.00 cm/s 103 cm/s  SHUNTS MV A velocity: 94.10 cm/s  70.3 cm/s Systemic VTI:  0.19 m MV E/A ratio:  1.26        1.5       Systemic Diam: 1.70 cm  Zoila Shutter MD Electronically signed by Zoila Shutter MD Signature Date/Time: 09/10/2019/5:12:23 PM    Final    CT Renal Stone Study  Addendum Date: 09/10/2019   ADDENDUM REPORT: 09/10/2019 04:34 ADDENDUM: Case reviewed in conjunction with post intubation chest x-ray. Mid left ureteral calculi are noted just beyond the gonadal vein crossing - there is a 15 mm long segment of clustered calculi measuring up to 7 mm in thickness. Left emphysematous pyelitis as noted previously. These results were called by telephone at the time of interpretation on 09/10/2019 at 4:34 am to provider Dr. Karie Mainland, who verbally acknowledged these results. Electronically Signed   By: Marnee Spring M.D.   On: 09/10/2019 04:34   Result Date: 09/10/2019 CLINICAL DATA:  Back and flank pain laterality not specified, question kidney stone disease EXAM: CT ABDOMEN AND PELVIS WITHOUT CONTRAST TECHNIQUE: Multidetector CT imaging of the abdomen and pelvis was performed following the standard protocol without IV contrast. Sagittal and coronal MPR images reconstructed from axial data set. No oral contrast administered. COMPARISON:  03/21/2018 FINDINGS: Lower chest: Bibasilar atelectasis versus infiltrate in lower lobes.  Hepatobiliary: Gallbladder and liver normal appearance Pancreas: Normal appearance Spleen: Normal appearance Adrenals/Urinary Tract: Adrenal glands and RIGHT kidney normal appearance. LEFT hydronephrosis and hydroureter with perinephric edema. No definite ureteral calcification visualized. Single small focus of gas at upper pole of LEFT kidney. No renal masses or calculi identified. RIGHT ureter decompressed. Bladder unremarkable; no air in bladder. Stomach/Bowel: Stomach decompressed. Large and small bowel loops unremarkable. Low lying cecum in pelvis. Appendix not visualized. Vascular/Lymphatic: Few pelvic phleboliths. Atherosclerotic calcifications aorta without aneurysm. Prominent LEFT para-aortic lymph nodes at the level of the kidney, largest 11 mm short axis image 34. Reproductive: Unremarkable uterus and ovaries Other: No free air or free fluid.  No hernia. Musculoskeletal: Osseous demineralization. IMPRESSION: LEFT hydronephrosis, hydroureter, and perinephric edema though no definite ureteral calculus is visualized. This could be due to a passed renal calculus. However, a single focus of gas is identified at the upper pole of the LEFT kidney, raising question that these findings could be due to emphysematous pyelonephritis rather than stone disease; recommend correlation with urinalysis. Repeat CT imaging could also be performed with IV contrast if urinalysis is nondiagnostic, which would allow for detection of ureteral enhancement and the typical patchy renal parenchymal enhancement of pyelonephritis. Findings called to Dietrich Pates PA on 09/09/2019 at 1843 hours. Electronically Signed: By: Ulyses Southward M.D. On:  09/09/2019 18:47   IR NEPHROSTOMY PLACEMENT LEFT  Result Date: 09/10/2019 INDICATION: Patient admitted with urosepsis found to have obstructing left-sided ureteral stone. Request made for placement of image guided left-sided percutaneous nephrostomy catheter for infection source control  purposes. EXAM: 1. ULTRASOUND GUIDANCE FOR PUNCTURE OF THE LEFT RENAL COLLECTING SYSTEM 2. LEFT PERCUTANEOUS NEPHROSTOMY TUBE PLACEMENT. COMPARISON:  CT abdomen and pelvis - 09/10/2019 MEDICATIONS: Patient admitted to the hospital receiving intravenous antibiotics; the antibiotic was administered in an appropriate time frame prior to skin puncture. ANESTHESIA/SEDATION: None, the patient is currently intubated CONTRAST:  15 cc Omnipaque 300 administered into the collecting system FLUOROSCOPY TIME:  1 minute, 6 seconds (13 mGy) COMPLICATIONS: None immediate. PROCEDURE: The procedure, risks, benefits, and alternatives were explained to the patient's son. Questions regarding the procedure were encouraged and answered. The patient's son understands and consents to the procedure. A timeout was performed prior to the initiation of the procedure. The left flank region was prepped with Betadine in a sterile fashion, and a sterile drape was applied covering the operative field. A sterile gown and sterile gloves were used for the procedure. Local anesthesia was provided with 1% Lidocaine with epinephrine. Ultrasound was used to localize the left kidney. Under direct ultrasound guidance, a 21 gauge needle was advanced into the renal collecting system. An ultrasound image documentation was performed. Access within the collecting system was confirmed with the efflux of urine followed by contrast injection. Over a Nitrex wire, the tract was dilated with an Accustick stent. Next, a short Amplatz wire was coiled in the left renal collecting system. Under intermittent fluoroscopic guidance, the track was dilated ultimately allowing placement of a 10-French percutaneous nephrostomy catheter with end coiled and locked within left renal pelvis. Contrast was injected and several sport radiographs were obtained in various obliquities confirming access. A small sample of purulent appearing aspirated urine was capped and sent laboratory  analysis. The catheter was secured at the skin with a Prolene retention suture and a gravity bag was placed. A dressing was placed. The patient tolerated procedure well without immediate postprocedural complication. FINDINGS: Ultrasound scanning demonstrates a moderately dilated collecting system as demonstrated on preceding abdominal CT. Under direct ultrasound guidance, a posterior inferior calix was targeted allowing advancement of an 10-French percutaneous nephrostomy catheter under intermittent fluoroscopic guidance. Contrast injection confirmed appropriate positioning. IMPRESSION: 1. Successful ultrasound and fluoroscopic guided placement of a left sided 10 French PCN. 2. Small sample of aspirated purulent appearing urine was capped and sent to the laboratory for analysis. Electronically Signed   By: Simonne Come M.D.   On: 09/10/2019 09:35    Labs:  CBC: Recent Labs    09/10/19 0531 09/10/19 1355 09/11/19 0400 09/12/19 0313  WBC 2.6* 14.8* 24.4* 16.2*  HGB 9.2* 7.5* 7.8* 7.1*  HCT 27.7* 23.2* 23.7* 20.6*  PLT 122* 115* 74* 48*    COAGS: Recent Labs    09/09/19 1639  INR 1.5*    BMP: Recent Labs    09/10/19 0531 09/10/19 1355 09/11/19 0400 09/12/19 0313  NA 132* 135 137 139  K 4.2 3.6 5.0 2.9*  CL 107 108 104 100  CO2 14* 14* 16* 27  GLUCOSE 193* 166* 202* 416*  BUN 32* 34* 42* 68*  CALCIUM 7.0* 6.7* 6.5* 6.4*  CREATININE 2.30* 2.54* 2.96* 2.58*  GFRNONAA 21* 19* 16* 18*  GFRAA 24* 22* 18* 21*    LIVER FUNCTION TESTS: Recent Labs    09/09/19 1639 09/12/19 0313  BILITOT 0.9 1.2  AST 19 207*  ALT 13 144*  ALKPHOS 138* 84  PROT 7.3 5.4*  ALBUMIN 3.4* 2.0*    Assessment and Plan: Pt with history of urosepsis/left emphysematous pyelonephritis/nephrolithiasis; status post left percutaneous nephrostomy on 12/28; creatinine 2.58 down slightly from 2.96, K 2.9-replace, calcium 6.4, WBC 16.2 down from 24.4, hemoglobin 7.1 down from 7.8, platelets 48K down from  74k; urine culture with E. Coli-currently on meropenem; cont PCN, flush cath tid, monitor output/labs closely; other plans as per CCM/nephrology/urology  Electronically Signed: D. Jeananne RamaKevin Niki Payment, PA-C 09/12/2019, 11:51 AM   I spent a total of 15 minutes at the the patient's bedside AND on the patient's hospital floor or unit, greater than 50% of which was counseling/coordinating care for left percutaneous nephrostomy    Patient ID: Rachel Nelson, female   DOB: 02/21/1951, 68 y.o.   MRN: [id number]

## 2019-09-12 NOTE — Progress Notes (Signed)
[facility] Progress Note  Subjective: BP's up and pressors down, UOP up and creat down   Vitals:   09/12/19 1000 09/12/19 1100 09/12/19 1200 09/12/19 1300  BP: 97/60 101/60 103/71 103/68  Pulse: 83 74 77 74  Resp: 13 15 (!) 21 18  Temp: 98.8 F (37.1 C) 98.2 F (36.8 C) 98.2 F (36.8 C) 98.2 F (36.8 C)  TempSrc:    Bladder  SpO2: 92% 96% 98% 97%  Weight:      Height:        Exam: Gen on vent Sclera anicteric  No jvd or bruits Chest clear bilat, no rales/ wheezing RRR no MRG Abd soft ntnd no mass or ascites +bs GU foley w/ clear urine MS no joint effusions or deformity Ext 1-2+ diffuse ext edema  Neuro is alert, Ox 3 , nf    Home meds:  - aspirin 81/ bactrim DS bid  - insulin glargine 10u hs/ metformin xr 500 bid  -  prn's/ vitamins/ supplements   UA 12/27 > 11-20 rbc/ 6-10 wbc, 0-5 epi, few bact, 100 prot  Renal stone CT 12/27 > IMPRESSION: LEFT hydronephrosis, hydroureter, and perinephric edema though no definite ureteral calculus is visualized. This could be due to a passed renal calculus. However, a single focus of gas is identified at the upper pole of the LEFT kidney, raising question that these findings could be due to emphysematous pyelonephritis rather than stone disease   Assessment/ Plan: 1. AKI - last creat in 2018 was 1.2. ATN vs hemodynamic AKI due to shock / hypoperfusion. Creat 2.9 yest and 2.5 today.  Making urine now and creat down better. No indication for RRT at this time. Appears to be recovering.  Weaning down pressors. She is vol overloaded so lowered d5 1/2 IVF"s to 40 cc/hr. Will follow.  2. L sided pyelonephritis w/ hydronephrosis - sp L PCN on 12/28, IV abx 3. Septic shock - weaning pressor support 4. DM2 5. VDRF - CXR a little better 6. Anemia - tranfuse prn       Rob Kensi Karr 09/12/2019, 1:13 PM  Inpatient medications: . atorvastatin  20 mg Oral q1800  . chlorhexidine gluconate (MEDLINE KIT)  15 mL Mouth  Rinse BID  . Chlorhexidine Gluconate Cloth  6 each Topical Daily  . [START ON 09/13/2019] heparin injection (subcutaneous)  5,000 Units Subcutaneous Q8H  . hydrocortisone sod succinate (SOLU-CORTEF) inj  50 mg Intravenous Q6H  . mouth rinse  15 mL Mouth Rinse 10 times per day  . sodium chloride flush  10-40 mL Intracatheter Q12H  . sodium chloride flush  3 mL Intravenous Q12H  . sodium chloride flush  5 mL Intracatheter Q8H   . sodium chloride Stopped (09/11/19 1255)  . sodium chloride 10 mL/hr at 09/12/19 1008  . dexmedetomidine (PRECEDEX) IV infusion 0.6 mcg/kg/hr (09/12/19 1311)  . dextrose 5 % and 0.45% NaCl 40 mL/hr at 09/12/19 1311  . famotidine (PEPCID) IV Stopped (09/12/19 1008)  . feeding supplement (VITAL AF 1.2 CAL) 1,000 mL (09/12/19 1303)  . feeding supplement (VITAL HIGH PROTEIN) 1,000 mL (09/11/19 1506)  . fentaNYL infusion INTRAVENOUS 75 mcg/hr (09/12/19 1311)  . insulin 16 mL/hr at 09/12/19 1311  . meropenem (MERREM) IV Stopped (09/12/19 0753)  . norepinephrine (LEVOPHED) Adult infusion 2 mcg/min (09/12/19 1311)  .  sodium bicarbonate (isotonic) infusion in sterile water 100 mL/hr at 09/12/19 1311  . vasopressin (PITRESSIN) infusion - *FOR SHOCK* Stopped (09/11/19 1105)   acetaminophen, acetaminophen, albuterol, bisacodyl,  dextrose, docusate, fentaNYL, fentaNYL, ondansetron (ZOFRAN) IV, ondansetron **OR** [DISCONTINUED] ondansetron (ZOFRAN) IV, sodium chloride flush

## 2019-09-12 NOTE — Progress Notes (Signed)
[facility] Progress Note Patient Name: Rachel Nelson DOB: 07-20-1951 MRN: [id number]   Date of Service  09/12/2019  HPI/Events of Note  Blood sugar 398 mg %  eICU Interventions  Intravenous insulin infusion orders placed        Salima Rumer U Eugine Bubb 09/12/2019, 4:36 AM

## 2019-09-12 NOTE — Progress Notes (Signed)
NAME:  Rachel Nelson, MRN:  [id number], DOB:  [birth year], LOS: 2 ADMISSION DATE:  09/09/2019, CONSULTATION DATE: 09/10/2019 REFERRING MD: Allena Katz, CHIEF COMPLAINT: Left flank pain  Brief History   68 y/o F admitted 12/27 with reports of left flank pain, chills and malaise.  Hx of ESBL E Coli in 2018.  Found to be in septic shock on admit with UA suggestive of UTI.  CT imaging showed left ureteral calculi with obstructing hydronephrosis, left emphysematous pyelitis. COVID negative.  The patient was initially admitted to SDU/TRH but decompensated in the ER with worsening hypotension, increased work of breathing requiring intubation & vasopressors.   Past Medical History  Diabetes ESBL E. coli in 2018  Significant Hospital Events   12/27 Admit  12/28 Decompensated with worsening hypotension, resp distress > intubated   Consults:  Urology  Procedures:  Left IR Perc drain for hydro 12/28 >> L IJ TLC 12/28 >>  ETT 12/28 >>  Significant Diagnostic Tests:  CT Renal Study 12/27 >> mid left ureteral calculi are noted just beyond the gonadal veing crossing, 42mm long segment of clustered calculi measuring up to 11mm in thickness, left emphysematous pyelitis, obstructing hydronephrosis on L ECHO 12/28 >> LVEF ~ 60-65%, grade I diastolic dysfunction, moderately elevated PA systolic pressure   Micro Data:  COVID 12/27 >> negative  UC 12/27 >> E-Coli >> S- imipenem, bactrum, nitrofurantion, otherwise resistant  BCID 12/27 >> E coli and Enterobacteriae detected BCx2 12/27 >> E-Coli >>  Antimicrobials:  Meropenem 12/28 >> Vancomycin 12/28 >>   Interim history/subjective:  Tmax 98.4, overall fever curve improved Levophed at 2 mcg's  I/O - 1.2L UOP, +2.3L in last 24 hours  Glucose range - 270-400 > remains on insulin gtt   Objective   Blood pressure 110/61, pulse 86, temperature 98.4 F (36.9 C), resp. rate (!) 21, height 5\' 5"  (1.651 m), weight 71.8 kg, SpO2 95 %.    Vent Mode: PRVC FiO2  (%):  [40 %] 40 % Set Rate:  [18 bmp] 18 bmp Vt Set:  [450 mL] 450 mL PEEP:  [5 cmH20] 5 cmH20 Plateau Pressure:  [19 cmH20-21 cmH20] 21 cmH20   Intake/Output Summary (Last 24 hours) at 09/12/2019 1044 Last data filed at 09/12/2019 0900 Gross per 24 hour  Intake 3569.53 ml  Output 1245 ml  Net 2324.53 ml   Filed Weights   09/10/19 1400 09/11/19 0500 09/12/19 0342  Weight: 67.4 kg 68.5 kg 71.8 kg    Examination: General: critically ill appearing, small adult female lying in bed on vent in NAD  HEENT: MM pink/moist, ETT, pupils 65mm/reactive  Neuro: sedate, responds to name with a nod but does not follow commands, ? Language barrier CV: s1s2 RRR, no m/r/g PULM:  Non-labored on vent, lungs bilaterally clear  GI: protuberant but soft, bsx4 active  Extremities: warm/dry, 1+ edema  Skin: no rashes or lesions  Resolved Hospital Problem list     Assessment & Plan:   Septic Shock secondary to Obstructive Uropathy with Hydronephrosis, E-Coli  69mm obstructing stone in left ureter.  Required IR perc drain 12/28. Hx ESBL E-Coli in 2018 -abx as above, renal dose per pharmacy  -appreciate IR assistance with percutaneous drainage  -wean levophed for MAP > 65 -continue stress dose steroids   Acute Hypoxemic Respiratory Failure CXR with infiltrates, ? ALI from sepsis vs acute pulmonary infection / effusion  -PRVC 8cc/kg  -wean PEEP / fiO2 for sats >90% -follow CXR  -daily SBT / WUA  -VAP  prevention measures    Need for Sedation secondary to Mechanical Ventilation  -PAD Protocol for RASS Goal 0 to -1  -continue precedex  -fentanyl for pain  -delirium prevention measures   AKI  Metabolic / Lactic Acidosis  Hyponatremia   Hypokalemia  In setting of obstructive uropathy -stop bicarb after completion of current bag -Trend BMP / urinary output -Replace electrolytes as indicated -Avoid nephrotoxic agents, ensure adequate renal perfusion -appreciate Nephrology evaluation > rec's  for supportive care, no indication for acute HD  Pancytopenia  Secondary to septic shock -trend CBC -transfuse as appropriate  -correct underlying causes  -SCD's  -reschedule SQ heparin for 0600 12/31 am, received dose 0600 12/30 -monitor for bleeding   Elevated Troponin  -EKG now -repeat troponin to ensure clearance  -ECHO as above  DM  -Insulin gtt  At Risk Malnutrition  -TF per Nutrition     Best practice:  Diet: NPO Pain/Anxiety/Delirium protocol (if indicated): Precedex VAP protocol (if indicated): Ordered DVT prophylaxis: Heparin sq, SCD's  GI prophylaxis: Pepcid Glucose control: SSI Mobility: BR, advance as tolerated  Code Status: Full code Family Communication: Son updated at bedside 12/30 Disposition: ICU  Labs   CBC: Recent Labs  Lab 09/09/19 1639 09/10/19 0531 09/10/19 1355 09/11/19 0400 09/12/19 0313  WBC 6.2 2.6* 14.8* 24.4* 16.2*  NEUTROABS 5.8  --   --   --  14.6*  HGB 9.6* 9.2* 7.5* 7.8* 7.1*  HCT 29.5* 27.7* 23.2* 23.7* 20.6*  MCV 87.3 89.9 89.6 89.1 84.4  PLT 200 122* 115* 74* 48*    Basic Metabolic Panel: Recent Labs  Lab 09/09/19 1639 09/10/19 0531 09/10/19 1355 09/10/19 1846 09/11/19 0400 09/11/19 1744 09/12/19 0313  NA 131* 132* 135  --  137  --  139  K 3.3* 4.2 3.6  --  5.0  --  2.9*  CL 101 107 108  --  104  --  100  CO2 19* 14* 14*  --  16*  --  27  GLUCOSE 205* 193* 166*  --  202*  --  416*  BUN 31* 32* 34*  --  42*  --  68*  CREATININE 2.14* 2.30* 2.54*  --  2.96*  --  2.58*  CALCIUM 8.6* 7.0* 6.7*  --  6.5*  --  6.4*  MG  --   --  1.3* 2.6* 2.5* 2.6* 2.7*  PHOS  --   --  2.0* 3.9 5.1* 4.1 3.1   GFR: Estimated Creatinine Clearance: 20.7 mL/min (A) (by C-G formula based on SCr of 2.58 mg/dL (H)). Recent Labs  Lab 09/09/19 2000 09/10/19 0531 09/10/19 0536 09/10/19 1355 09/11/19 0400 09/11/19 1101 09/12/19 0313  PROCALCITON  --   --   --  >150.00 >150.00  --  95.15  WBC  --  2.6*  --  14.8* 24.4*  --  16.2*    LATICACIDVEN 1.7 4.1* 3.2*  --   --  3.9*  --     Liver Function Tests: Recent Labs  Lab 09/09/19 1639 09/12/19 0313  AST 19 207*  ALT 13 144*  ALKPHOS 138* 84  BILITOT 0.9 1.2  PROT 7.3 5.4*  ALBUMIN 3.4* 2.0*   No results for input(s): LIPASE, AMYLASE in the last 168 hours. No results for input(s): AMMONIA in the last 168 hours.  ABG    Component Value Date/Time   PHART 7.286 (L) 09/10/2019 1232   PCO2ART 28.0 (L) 09/10/2019 1232   PO2ART 60.2 (L) 09/10/2019 1232   HCO3  12.9 (L) 09/10/2019 1232   ACIDBASEDEF 12.2 (H) 09/10/2019 1232   O2SAT 88.2 09/10/2019 1232     Coagulation Profile: Recent Labs  Lab 09/09/19 1639  INR 1.5*    Cardiac Enzymes: No results for input(s): CKTOTAL, CKMB, CKMBINDEX, TROPONINI in the last 168 hours.  HbA1C: Hgb A1c MFr Bld  Date/Time Value Ref Range Status  09/10/2019 05:31 AM 7.6 (H) 4.8 - 5.6 % Final    Comment:    (NOTE) Pre diabetes:          5.7%-6.4% Diabetes:              >6.4% Glycemic control for   <7.0% adults with diabetes     CBG: Recent Labs  Lab 09/12/19 0507 09/12/19 0608 09/12/19 0719 09/12/19 0844 09/12/19 0932  GLUCAP 351* 358* 301* 284* 269*      Critical care time: 70 minutes    Noe Gens, MSN, NP-C [facility] 09/12/2019, 10:44 AM   Please see [email or web address] for pager details.

## 2019-09-12 NOTE — Progress Notes (Signed)
Nutrition Follow-up  DOCUMENTATION CODES:   Not applicable  INTERVENTION:  - will adjust TF regimen: Vital AF 1.2 @ 50 ml/hr to provide 1140 kcal (99% re-estimated kcal need), 90 grams protein, and 973 ml free water. - free water flush, if desired, to be per MD/NP.    NUTRITION DIAGNOSIS:   Inadequate oral intake related to inability to eat as evidenced by NPO status. -ongoing  GOAL:   Patient will meet greater than or equal to 90% of their needs -met with TF regimen  MONITOR:   Vent status, TF tolerance, Labs, Weight trends  ASSESSMENT:   68 y.o. female with medical history significant for type 2 DM. She presented to the ED for flank pain, malaise, and chills which developed 2 days PTA. These symptoms were associated with nausea and decreased appetite.  Patient remains intubated with OGT in place. She is receiving Vital High Protein @ 60 ml/hr with 30 ml prostat TID. This regimen is providing 1740 kcal, 171 grams protein, and 1204 kcal. RN reports patient is tolerating TF without issue.   Estimated protein need yesterday was in anticipation of CRRT start. CRRT ended up not being needed for this patient. Re-estimated protein need at this time.   Per notes: - septic shock 2/2 obstructive uropathy with hydronephrosis--s/p perc drain placement - levo for MAP >65 - acute hypoxemic respiratory failure--possible ALI - AKI - metabolic acidosis, lactic acidosis--Nephrology determined no need for acute HD; bicarb drip stopped   Patient is currently intubated on ventilator support MV: 7.9 L/min Temp (24hrs), Avg:98.6 F (37 C), Min:97.9 F (36.6 C), Max:99.5 F (37.5 C) Propofol: none BP: 112/60 and MAP: 78    Labs reviewed; Na: 133 mmol/l, creatinine: 0.39 mg/dl. Medications reviewed; 20 mg IV pepcid, 50 mg solu-cortef QID, 40 mEq KCl per tube x1 dose 12/30. IVF; D5-1/2 NS @ 75 ml/hr (306 kcal). Drips; insulin @ 10.5 units/hr, fentanyl @ 75 mcg/hr, levo @ 2 mcg/min,  precedex @ 0.6 mcg/kg/hr.     Diet Order:   Diet Order            Diet NPO time specified  Diet effective now              EDUCATION NEEDS:   No education needs have been identified at this time  Skin:  Skin Assessment: Reviewed RN Assessment  Last BM:  12/29  Height:   Ht Readings from Last 1 Encounters:  09/10/19 5' 5" (1.651 m)    Weight:   Wt Readings from Last 1 Encounters:  09/12/19 71.8 kg    Ideal Body Weight:  56.8 kg  BMI:  Body mass index is 26.34 kg/m.  Estimated Nutritional Needs:   Kcal:  1457 kcal  Protein:  81-101 grams  Fluid:  >/= 2 L/day     Jessica Ostheim, MS, RD, LDN, CNSC Inpatient Clinical Dietitian Pager # [phone] After hours/weekend pager # [phone]  

## 2019-09-13 ENCOUNTER — Inpatient Hospital Stay (HOSPITAL_COMMUNITY): Payer: BC Managed Care – PPO

## 2019-09-13 DIAGNOSIS — E1122 Type 2 diabetes mellitus with diabetic chronic kidney disease: Secondary | ICD-10-CM | POA: Diagnosis not present

## 2019-09-13 DIAGNOSIS — N390 Urinary tract infection, site not specified: Secondary | ICD-10-CM | POA: Diagnosis not present

## 2019-09-13 DIAGNOSIS — N201 Calculus of ureter: Secondary | ICD-10-CM | POA: Diagnosis not present

## 2019-09-13 DIAGNOSIS — N10 Acute tubulo-interstitial nephritis: Secondary | ICD-10-CM | POA: Diagnosis not present

## 2019-09-13 DIAGNOSIS — A4151 Sepsis due to Escherichia coli [E. coli]: Secondary | ICD-10-CM | POA: Diagnosis not present

## 2019-09-13 DIAGNOSIS — J9690 Respiratory failure, unspecified, unspecified whether with hypoxia or hypercapnia: Secondary | ICD-10-CM | POA: Diagnosis not present

## 2019-09-13 DIAGNOSIS — A419 Sepsis, unspecified organism: Secondary | ICD-10-CM | POA: Diagnosis not present

## 2019-09-13 DIAGNOSIS — R0602 Shortness of breath: Secondary | ICD-10-CM | POA: Diagnosis not present

## 2019-09-13 DIAGNOSIS — N179 Acute kidney failure, unspecified: Secondary | ICD-10-CM | POA: Diagnosis not present

## 2019-09-13 DIAGNOSIS — Z4682 Encounter for fitting and adjustment of non-vascular catheter: Secondary | ICD-10-CM | POA: Diagnosis not present

## 2019-09-13 DIAGNOSIS — N12 Tubulo-interstitial nephritis, not specified as acute or chronic: Secondary | ICD-10-CM | POA: Diagnosis not present

## 2019-09-13 DIAGNOSIS — R0902 Hypoxemia: Secondary | ICD-10-CM | POA: Diagnosis not present

## 2019-09-13 LAB — GLUCOSE, CAPILLARY
Glucose-Capillary: 111 mg/dL — ABNORMAL HIGH (ref 70–99)
Glucose-Capillary: 120 mg/dL — ABNORMAL HIGH (ref 70–99)
Glucose-Capillary: 124 mg/dL — ABNORMAL HIGH (ref 70–99)
Glucose-Capillary: 131 mg/dL — ABNORMAL HIGH (ref 70–99)
Glucose-Capillary: 136 mg/dL — ABNORMAL HIGH (ref 70–99)
Glucose-Capillary: 151 mg/dL — ABNORMAL HIGH (ref 70–99)
Glucose-Capillary: 152 mg/dL — ABNORMAL HIGH (ref 70–99)
Glucose-Capillary: 157 mg/dL — ABNORMAL HIGH (ref 70–99)
Glucose-Capillary: 163 mg/dL — ABNORMAL HIGH (ref 70–99)
Glucose-Capillary: 164 mg/dL — ABNORMAL HIGH (ref 70–99)
Glucose-Capillary: 172 mg/dL — ABNORMAL HIGH (ref 70–99)
Glucose-Capillary: 188 mg/dL — ABNORMAL HIGH (ref 70–99)
Glucose-Capillary: 203 mg/dL — ABNORMAL HIGH (ref 70–99)
Glucose-Capillary: 210 mg/dL — ABNORMAL HIGH (ref 70–99)
Glucose-Capillary: 214 mg/dL — ABNORMAL HIGH (ref 70–99)
Glucose-Capillary: 214 mg/dL — ABNORMAL HIGH (ref 70–99)
Glucose-Capillary: 220 mg/dL — ABNORMAL HIGH (ref 70–99)
Glucose-Capillary: 221 mg/dL — ABNORMAL HIGH (ref 70–99)
Glucose-Capillary: 226 mg/dL — ABNORMAL HIGH (ref 70–99)
Glucose-Capillary: 226 mg/dL — ABNORMAL HIGH (ref 70–99)

## 2019-09-13 LAB — CBC
HCT: 23 % — ABNORMAL LOW (ref 36.0–46.0)
Hemoglobin: 7.8 g/dL — ABNORMAL LOW (ref 12.0–15.0)
MCH: 29.3 pg (ref 26.0–34.0)
MCHC: 33.9 g/dL (ref 30.0–36.0)
MCV: 86.5 fL (ref 80.0–100.0)
Platelets: 42 10*3/uL — ABNORMAL LOW (ref 150–400)
RBC: 2.66 MIL/uL — ABNORMAL LOW (ref 3.87–5.11)
RDW: 14.6 % (ref 11.5–15.5)
WBC: 14 10*3/uL — ABNORMAL HIGH (ref 4.0–10.5)
nRBC: 0.1 % (ref 0.0–0.2)

## 2019-09-13 LAB — BASIC METABOLIC PANEL
Anion gap: 11 (ref 5–15)
Anion gap: 7 (ref 5–15)
BUN: 67 mg/dL — ABNORMAL HIGH (ref 8–23)
BUN: 61 mg/dL — ABNORMAL HIGH (ref 8–23)
CO2: 30 mmol/L (ref 22–32)
CO2: 29 mmol/L (ref 22–32)
Calcium: 6.4 mg/dL — CL (ref 8.9–10.3)
Calcium: 6.5 mg/dL — ABNORMAL LOW (ref 8.9–10.3)
Chloride: 102 mmol/L (ref 98–111)
Chloride: 111 mmol/L (ref 98–111)
Creatinine, Ser: 1.78 mg/dL — ABNORMAL HIGH (ref 0.44–1.00)
Creatinine, Ser: 1.47 mg/dL — ABNORMAL HIGH (ref 0.44–1.00)
GFR calc Af Amer: 33 mL/min — ABNORMAL LOW (ref >60)
GFR calc Af Amer: 42 mL/min — ABNORMAL LOW (ref >60)
GFR calc non Af Amer: 29 mL/min — ABNORMAL LOW (ref >60)
GFR calc non Af Amer: 36 mL/min — ABNORMAL LOW (ref >60)
Glucose, Bld: 221 mg/dL — ABNORMAL HIGH (ref 70–99)
Glucose, Bld: 179 mg/dL — ABNORMAL HIGH (ref 70–99)
Potassium: 2.6 mmol/L — CL (ref 3.5–5.1)
Potassium: 3.3 mmol/L — ABNORMAL LOW (ref 3.5–5.1)
Sodium: 143 mmol/L (ref 135–145)
Sodium: 147 mmol/L — ABNORMAL HIGH (ref 135–145)

## 2019-09-13 LAB — CULTURE, BLOOD (ROUTINE X 2)

## 2019-09-13 LAB — CALCIUM, IONIZED: Calcium, Ionized, Serum: 3.6 mg/dL — ABNORMAL LOW (ref 4.5–5.6)

## 2019-09-13 MED ORDER — FUROSEMIDE 10 MG/ML IJ SOLN
40.0000 mg | Freq: Once | INTRAMUSCULAR | Status: AC
  Administered 2019-09-13: 40 mg via INTRAVENOUS
  Filled 2019-09-13: qty 4

## 2019-09-13 MED ORDER — ORAL CARE MOUTH RINSE
15.0000 mL | Freq: Two times a day (BID) | OROMUCOSAL | Status: DC
  Administered 2019-09-14 – 2019-09-19 (×11): 15 mL via OROMUCOSAL

## 2019-09-13 MED ORDER — INSULIN ASPART 100 UNIT/ML ~~LOC~~ SOLN
1.0000 [IU] | SUBCUTANEOUS | Status: DC
  Administered 2019-09-13 (×2): 3 [IU] via SUBCUTANEOUS
  Administered 2019-09-13: 2 [IU] via SUBCUTANEOUS
  Administered 2019-09-14 (×2): 3 [IU] via SUBCUTANEOUS

## 2019-09-13 MED ORDER — CHLORHEXIDINE GLUCONATE 0.12 % MT SOLN
15.0000 mL | Freq: Two times a day (BID) | OROMUCOSAL | Status: DC
  Administered 2019-09-14 – 2019-09-20 (×12): 15 mL via OROMUCOSAL
  Filled 2019-09-13 (×9): qty 15

## 2019-09-13 MED ORDER — POTASSIUM CHLORIDE CRYS ER 20 MEQ PO TBCR: 40.0000 meq | EXTENDED_RELEASE_TABLET | Freq: Once | ORAL | Status: DC

## 2019-09-13 MED ORDER — POTASSIUM CHLORIDE 20 MEQ/15ML (10%) PO SOLN
40.0000 meq | Freq: Once | ORAL | Status: AC
  Administered 2019-09-13: 40 meq
  Filled 2019-09-13: qty 30

## 2019-09-13 MED ORDER — POTASSIUM CHLORIDE 10 MEQ/50ML IV SOLN
10.0000 meq | INTRAVENOUS | Status: AC
  Administered 2019-09-13 – 2019-09-14 (×4): 10 meq via INTRAVENOUS
  Filled 2019-09-13 (×4): qty 50

## 2019-09-13 MED ORDER — POTASSIUM CHLORIDE 10 MEQ/50ML IV SOLN
10.0000 meq | INTRAVENOUS | Status: AC
  Administered 2019-09-13 (×3): 10 meq via INTRAVENOUS
  Filled 2019-09-13 (×2): qty 50

## 2019-09-13 MED ORDER — INSULIN GLARGINE 100 UNIT/ML ~~LOC~~ SOLN
6.0000 [IU] | Freq: Every day | SUBCUTANEOUS | Status: AC
  Administered 2019-09-13: 6 [IU] via SUBCUTANEOUS
  Filled 2019-09-13: qty 0.06

## 2019-09-13 MED ORDER — SODIUM CHLORIDE 0.9 % IV SOLN
INTRAVENOUS | Status: DC | PRN
  Administered 2019-09-16: 1000 mL via INTRAVENOUS

## 2019-09-13 NOTE — Evaluation (Signed)
Clinical/Bedside Swallow Evaluation Patient Details  Name: Rachel Nelson MRN: [id number] Date of Birth: 05/01/1951  Today's Date: 09/13/2019 Time: SLP Start Time (ACUTE ONLY): 1455 SLP Stop Time (ACUTE ONLY): 1528 SLP Time Calculation (min) (ACUTE ONLY): 33 min  Past Medical History:  Past Medical History:  Diagnosis Date  . Acute pyelonephritis 03/21/2017  . High cholesterol   . Renal insufficiency 03/21/2017  . Type II diabetes mellitus ([place])    Past Surgical History:  Past Surgical History:  Procedure Laterality Date  . IR NEPHROSTOMY PLACEMENT LEFT  09/10/2019  . NO PAST SURGERIES     HPI:  Ms Draya Felker, 68y/f, presented to ED for evaluation of flank pain, malaise and chills. Pt found to be septic due ti ESBL E. coli pyelonephritis. Ultimately pt required intubation. PMH of DM type 2, prior admission for pyelonephritis. No known history of dysphagia.    Assessment / Plan / Recommendation Clinical Impression  Clinical Swallow Evaluation completed after pt extubated 3 hours prior. RN requested [place] to see pt. Pt's voice is very strong (Pt not fluent in [place], however son present to translate for evaluation). She reports no sore throat. Normal oral motor and sensory exam. Pt tolerated trials of ice, water by cup, straw and sequential swallows, applesauce and cracker. Mastication and bolus manipulation were adequate with solids. Pt passes 3 oz swallow test. At completion of evaluation pt had drank 10 oz of water with no throat clear, cough or vocal changes. Recommend pt begin diet. RN suggest liquid diet as patient's abdomen is distended and she has had large amounts of gas today. Recommend pt advance diet as able per RN. Speech therapy to check diet tolerance.  SLP Visit Diagnosis: Dysphagia, unspecified (R13.10)    Aspiration Risk  Mild aspiration risk    Diet Recommendation Thin liquid(RN and MD to advance as pt's bowels improve)   Liquid Administration via:  Cup;Straw Medication Administration: Whole meds with liquid Supervision: Patient able to self feed Compensations: Slow rate;Small sips/bites Postural Changes: Seated upright at 90 degrees    Other  Recommendations Oral Care Recommendations: Oral care QID   Follow up Recommendations        Frequency and Duration min 1 x/week  1 week       Prognosis Prognosis for Safe Diet Advancement: Good      Swallow Study   General Date of Onset: 09/10/19 HPI: Ms Joanmarie Tsang, 68y/f, presented to ED for evaluation of flank pain, malaise and chills. Pt found to be septic due ti ESBL E. coli pyelonephritis. Ultimately pt required intubation. PMH of DM type 2, prior admission for pyelonephritis. No known history of dysphagia.  Type of Study: Bedside Swallow Evaluation Previous Swallow Assessment: none Diet Prior to this Study: NPO Temperature Spikes Noted: No Respiratory Status: Nasal cannula History of Recent Intubation: Yes Length of Intubations (days): 3 days Date extubated: 09/13/19 Behavior/Cognition: Alert;Cooperative;Pleasant mood Oral Cavity Assessment: Within Functional Limits Oral Care Completed by SLP: Yes Oral Cavity - Dentition: Adequate natural dentition Vision: Functional for self-feeding Self-Feeding Abilities: Able to feed self Patient Positioning: Upright in bed Baseline Vocal Quality: Normal Volitional Cough: Strong Volitional Swallow: Able to elicit    Oral/Motor/Sensory Function Overall Oral Motor/Sensory Function: Within functional limits   Ice Chips Ice chips: Within functional limits Presentation: Spoon   Thin Liquid Thin Liquid: Within functional limits Presentation: Cup;Straw    Nectar Thick Nectar Thick Liquid: Not tested   Honey Thick Honey Thick Liquid: Not tested   Puree Puree:  Within functional limits   Solid     Solid: Within functional limits Presentation: Self Fed      Luis Abed., MA, CCC-SLP 09/13/2019,3:35 PM

## 2019-09-13 NOTE — Progress Notes (Signed)
Pharmacy Antibiotic Note  Rachel Nelson is a 68 y.o. female admitted on 09/09/2019 with sepsis from pyelonephritis.  Blood and urine cultures grew ESBL (+) E.Coli. Pharmacy was consulted for meropenem dosing.  D#5 meropenem 1 gm IV q12h Fever resolved Leukocytosis slowly improving Off pressors SCr improving.  With estimated CrCl ~ 30 mL/min current dosage remains appropriate  Plan: Continue meropenem 1 gm IV q12h Follow renal function, adjust dosage as needed Plans noted per CCM for an abx course of at least 14 days   Height: 5\' 5"  (165.1 cm) Weight: 165 lb 2 oz (74.9 kg) IBW/kg (Calculated) : 57  Temp (24hrs), Avg:97.9 F (36.6 C), Min:97.3 F (36.3 C), Max:98.2 F (36.8 C)  Recent Labs  Lab 09/09/19 1639 09/09/19 2000 09/10/19 0531 09/10/19 0536 09/10/19 1355 09/11/19 0400 09/11/19 1101 09/12/19 0313 09/13/19 0342  WBC 6.2  --  2.6*  --  14.8* 24.4*  --  16.2* 14.0*  CREATININE 2.14*  --  2.30*  --  2.54* 2.96*  --  2.58* 1.78*  LATICACIDVEN 3.1* 1.7 4.1* 3.2*  --   --  3.9*  --   --     Estimated Creatinine Clearance: 30.7 mL/min (A) (by C-G formula based on SCr of 1.78 mg/dL (H)).    No Known Allergies  Antimicrobials this admission: 12/27 meropenem >>  12/27 vancomycin >> x1  Dose adjustments this admission: --  Microbiology results: 12/27 BCID: E.Coli 12/27 BCx: E.Coli, ESBL (+), S to imipenem 12/26 UCx: <10K, insig growth 12/28 UCx: >100K E.Coli, ESBL (+), S to imipenem 12/27: SARS-CoV-2: negative  Thank you for allowing pharmacy to be a part of this patient's care.  Clayburn Pert, PharmD, BCPS 09/13/2019  10:55 AM

## 2019-09-13 NOTE — Progress Notes (Signed)
Referring Physician(s): Rolan LipaAli, Muhammad  Supervising Physician: Richarda OverlieHenn, Adam  [facility]  [facility]  Chief Complaint: None- intubated with sedation  Subjective:  History of urosepsis secondary to obstructing left ureteral stone s/p percutaneous left nephrostomy tube placement in IR 09/10/2019 by Dr. Grace IsaacWatts. Patient laying in bed intubated with sedation. Accompanied by son at bedside. Left nephrostomy tube site c/d/i.   Allergies: Patient has no known allergies.  Medications: Prior to Admission medications   Medication Sig Start Date End Date Taking? Authorizing Provider  acetaminophen (TYLENOL) 325 MG tablet Take 325 mg by mouth every 6 (six) hours as needed for mild pain or headache.   Yes [provider]  aspirin EC 81 MG tablet Take 81 mg by mouth daily.   Yes [provider]  atorvastatin (LIPITOR) 20 MG tablet Take 20 mg by mouth daily. 07/25/19  Yes [provider]  metFORMIN (GLUCOPHAGE-XR) 500 MG 24 hr tablet Take 500 mg by mouth 2 (two) times daily with a meal.   Yes [provider]  insulin glargine (LANTUS) 100 UNIT/ML injection Inject 0.1 mLs (10 Units total) into the skin at bedtime. Patient not taking: Reported on 09/09/2019 03/24/17   Meredeth IdeLama, Gagan S, MD  ondansetron (ZOFRAN) 4 MG tablet Take 1 tablet (4 mg total) by mouth every 6 (six) hours. Prn n/v. Patient not taking: Reported on 03/21/2017 07/27/16   Linna HoffKindl, James D, MD  sulfamethoxazole-trimethoprim (BACTRIM DS,SEPTRA DS) 800-160 MG tablet Take 1 tablet by mouth 2 (two) times daily. Patient not taking: Reported on 09/09/2019 03/24/17   Meredeth IdeLama, Gagan S, MD     Vital Signs: BP (!) 113/59 (BP Location: Left Arm)   Pulse 64   Temp (!) 97.5 F (36.4 C) (Core) Comment (Src): temp foley   Resp 11   Ht 5\' 5"  (1.651 m)   Wt 165 lb 2 oz (74.9 kg)   SpO2 96%   BMI 27.48 kg/m   Physical Exam Vitals and nursing note reviewed.  Constitutional:      General: She is not in  acute distress.    Comments: Intubated with sedation.  Pulmonary:     Effort: Pulmonary effort is normal. No respiratory distress.     Comments: Intubated with sedation. Genitourinary:    Comments: Left nephrostomy tube site without erythema, drainage, or active bleeding; approximately 100 cc of clear yellow urine in gravity bag. Skin:    General: Skin is warm and dry.  Neurological:     Comments: Intubated with sedation.  Psychiatric:     Comments: Intubated with sedation.     Imaging: DG Abdomen 1 View  Result Date: 09/10/2019 CLINICAL DATA:  NG tube placement. EXAM: ABDOMEN - 1 VIEW COMPARISON:  None. FINDINGS: The NG tube is in the body region of the stomach. The bowel gas pattern is unremarkable. IMPRESSION: NG tube in good position in the body region of the stomach. Electronically Signed   By: Rudie MeyerP.  Gallerani M.D.   On: 09/10/2019 05:32   DG CHEST PORT 1 VIEW  Result Date: 09/13/2019 CLINICAL DATA:  Respiratory failure. Endotracheal tube position. EXAM: PORTABLE CHEST 1 VIEW COMPARISON:  Radiograph yesterday. FINDINGS: Endotracheal tube tip at the thoracic inlet. Enteric tube in place with tip below the diaphragm not included in the field of view. Left internal jugular central line tip in the lower SVC. Hazy opacities in the mid lower lung zones likely combination of pleural fluid and airspace disease/atelectasis. Unchanged heart size and mediastinal contours. Overall no  significant change from prior exam. IMPRESSION: 1. Unchanged appearance of the chest. Hazy opacities in the mid and lower lung zones, likely combination of pleural fluid and airspace disease/atelectasis. 2. Stable support apparatus. Electronically Signed   By: Keith Rake M.D.   On: 09/13/2019 06:41   DG CHEST PORT 1 VIEW  Result Date: 09/12/2019 CLINICAL DATA:  Respiratory failure. EXAM: PORTABLE CHEST 1 VIEW COMPARISON:  September 11, 2019. FINDINGS: Stable cardiomegaly. Endotracheal and nasogastric tubes are  unchanged in position. No pneumothorax is noted. Left internal jugular catheter is unchanged in position. Stable bibasilar opacities are noted concerning for atelectasis or edema with associated pleural effusions. Bony thorax is unremarkable. IMPRESSION: Stable support apparatus. Stable bibasilar opacities are noted concerning for atelectasis or edema with associated pleural effusions. Electronically Signed   By: Marijo Conception M.D.   On: 09/12/2019 07:25   DG Chest Port 1 View  Result Date: 09/11/2019 CLINICAL DATA:  Respiratory failure. EXAM: PORTABLE CHEST 1 VIEW COMPARISON:  Radiograph yesterday. FINDINGS: Endotracheal tube tip 13 mm from the carina. Enteric tube in place, tip not included in the field of view below the diaphragm. Left internal jugular central venous catheter tip in the lower SVC. Progressive hazy opacities throughout both lungs, most prominent at the bases. No visualized pneumothorax. Unchanged heart size and mediastinal contours. Left nephrostomy tube partially included. IMPRESSION: 1. Endotracheal tube tip 13 mm from the carina. Enteric tube and left central line remain in place. 2. Progressive hazy opacities throughout both lungs, most prominent at the bases. Findings are suspicious for developing pulmonary edema and pleural effusions. Electronically Signed   By: Keith Rake M.D.   On: 09/11/2019 05:59   DG Chest Portable 1 View  Result Date: 09/10/2019 CLINICAL DATA:  Central line placement EXAM: PORTABLE CHEST 1 VIEW COMPARISON:  Earlier today FINDINGS: Stable positioning of existing hardware. New left IJ line with tip at the upper cavoatrial junction. No visible pneumothorax. Stable interstitial coarsening with a few Kerley lines, the patient has received 4 L of IVF. No visible effusion. Normal heart size. IMPRESSION: New central line without complicating feature. Stable interstitial opacity, likely mild edema. Electronically Signed   By: Monte Fantasia M.D.   On:  09/10/2019 07:02   DG Chest Portable 1 View  Result Date: 09/10/2019 CLINICAL DATA:  Intubation EXAM: PORTABLE CHEST 1 VIEW COMPARISON:  Yesterday FINDINGS: Endotracheal tube tip is just below the clavicular heads. The enteric tube at least reaches the stomach. Normal heart size. Prominent vascular pedicle widening without bronchus depression or apical capping. This is attributed to supine positioning as there was no paratracheal thickening on comparison upright film. There are a few Kerley lines in generalized interstitial coarsening. No effusion or pneumothorax. IMPRESSION: 1. Unremarkable hardware positioning. 2. Mild interstitial opacity that could be atelectasis or mild edema. Electronically Signed   By: Monte Fantasia M.D.   On: 09/10/2019 04:16   DG Chest Port 1 View  Result Date: 09/09/2019 CLINICAL DATA:  Generalized weakness and malaise. Back pain for 3 days. EXAM: PORTABLE CHEST 1 VIEW COMPARISON:  One-view chest x-ray 07/27/2016 FINDINGS: The heart size is normal. Atherosclerotic changes are noted at the aortic arch. Mild bibasilar airspace opacities are present, left greater than right. Upper lung fields are clear. Small left effusion is suspected. IMPRESSION: 1. Mild bibasilar airspace disease, left greater than right. While this likely reflects atelectasis, infection is not excluded. 2. Probable small left pleural effusion. Electronically Signed   By: Wynetta Fines.D.  On: 09/09/2019 17:18   ECHOCARDIOGRAM COMPLETE  Result Date: 09/10/2019   ECHOCARDIOGRAM REPORT   Patient Name:   Rachel Nelson Date of Exam: 09/10/2019 Medical Rec #:  [id number]    Height:       65.0 in Accession #:    [id number]   Weight:       119.0 lb Date of Birth:  December 26, 1950    BSA:          1.59 m Patient Age:    68 years     BP:           118/35 mmHg Patient Gender: F            HR:           117 bpm. Exam Location:  Inpatient Procedure: 2D Echo, Cardiac Doppler and Color Doppler Indications:     Hypotension  History:        Patient has no prior history of Echocardiogram examinations.                 Risk Factors:Diabetes. Septic shock, elevated troponin, resp.                 failure, kidney failure.  Sonographer:    Lavenia Atlas Referring Phys: [phone] JAMES KIM IMPRESSIONS  1. Left ventricular ejection fraction, by visual estimation, is 60 to 65%. The left ventricle has normal function. There is no left ventricular hypertrophy.  2. Left ventricular diastolic parameters are consistent with Grade I diastolic dysfunction (impaired relaxation).  3. The left ventricle has no regional wall motion abnormalities.  4. Global right ventricle has normal systolic function.The right ventricular size is normal. No increase in right ventricular wall thickness.  5. Left atrial size was normal.  6. Right atrial size was normal.  7. The mitral valve is abnormal. Mild mitral valve regurgitation.  8. The tricuspid valve is grossly normal.  9. The aortic valve is tricuspid. Aortic valve regurgitation is not visualized. 10. The pulmonic valve was grossly normal. Pulmonic valve regurgitation is not visualized. 11. Moderately elevated pulmonary artery systolic pressure. 12. The inferior vena cava is dilated in size with <50% respiratory variability, suggesting right atrial pressure of 15 mmHg. FINDINGS  Left Ventricle: Left ventricular ejection fraction, by visual estimation, is 60 to 65%. The left ventricle has normal function. The left ventricle has no regional wall motion abnormalities. There is no left ventricular hypertrophy. Left ventricular diastolic parameters are consistent with Grade I diastolic dysfunction (impaired relaxation). Indeterminate filling pressures. Right Ventricle: The right ventricular size is normal. No increase in right ventricular wall thickness. Global RV systolic function is has normal systolic function. The tricuspid regurgitant velocity is 2.72 m/s, and with an assumed right atrial pressure  of  15 mmHg, the estimated right ventricular systolic pressure is moderately elevated at 44.6 mmHg. Left Atrium: Left atrial size was normal in size. Right Atrium: Right atrial size was normal in size Pericardium: There is no evidence of pericardial effusion. Mitral Valve: The mitral valve is abnormal. There is mild thickening of the mitral valve leaflet(s). Mild mitral valve regurgitation. Tricuspid Valve: The tricuspid valve is grossly normal. Tricuspid valve regurgitation is trivial. Aortic Valve: The aortic valve is tricuspid. Aortic valve regurgitation is not visualized. Aortic valve peak gradient measures 14.0 mmHg. Pulmonic Valve: The pulmonic valve was grossly normal. Pulmonic valve regurgitation is not visualized. Pulmonic regurgitation is not visualized. Aorta: The aortic root and ascending aorta are structurally normal, with no evidence of dilitation.  Venous: The inferior vena cava is dilated in size with less than 50% respiratory variability, suggesting right atrial pressure of 15 mmHg. IAS/Shunts: No atrial level shunt detected by color flow Doppler.  LEFT VENTRICLE PLAX 2D LVIDd:         4.30 cm  Diastology LVIDs:         2.90 cm  LV e' lateral:   10.10 cm/s LV PW:         0.90 cm  LV E/e' lateral: 11.8 LV IVS:        1.00 cm  LV e' medial:    10.90 cm/s LVOT diam:     1.70 cm  LV E/e' medial:  10.9 LV SV:         51 ml LV SV Index:   32.41 LVOT Area:     2.27 cm  RIGHT VENTRICLE RV Basal diam:  2.00 cm RV S prime:     12.00 cm/s TAPSE (M-mode): 2.5 cm LEFT ATRIUM           Index       RIGHT ATRIUM           Index LA diam:      3.00 cm 1.89 cm/m  RA Area:     10.60 cm LA Vol (A2C): 31.4 ml 19.79 ml/m RA Volume:   23.40 ml  14.75 ml/m LA Vol (A4C): 23.5 ml 14.81 ml/m  AORTIC VALVE AV Area (Vmax): 1.46 cm AV Vmax:        187.00 cm/s AV Peak Grad:   14.0 mmHg LVOT Vmax:      120.00 cm/s LVOT Vmean:     81.400 cm/s LVOT VTI:       0.188 m  AORTA Ao Root diam: 2.80 cm MITRAL VALVE                          TRICUSPID VALVE MV Area (PHT): 6.32 cm              TR Peak grad:   29.6 mmHg MV PHT:        34.80 msec            TR Vmax:        293.00 cm/s MV Decel Time: 120 msec MV E velocity: 119.00 cm/s 103 cm/s  SHUNTS MV A velocity: 94.10 cm/s  70.3 cm/s Systemic VTI:  0.19 m MV E/A ratio:  1.26        1.5       Systemic Diam: 1.70 cm  Zoila Shutter MD Electronically signed by Zoila Shutter MD Signature Date/Time: 09/10/2019/5:12:23 PM    Final    CT Renal Stone Study  Addendum Date: 09/10/2019   ADDENDUM REPORT: 09/10/2019 04:34 ADDENDUM: Case reviewed in conjunction with post intubation chest x-ray. Mid left ureteral calculi are noted just beyond the gonadal vein crossing - there is a 15 mm long segment of clustered calculi measuring up to 7 mm in thickness. Left emphysematous pyelitis as noted previously. These results were called by telephone at the time of interpretation on 09/10/2019 at 4:34 am to provider Dr. Karie Mainland, who verbally acknowledged these results. Electronically Signed   By: Marnee Spring M.D.   On: 09/10/2019 04:34   Result Date: 09/10/2019 CLINICAL DATA:  Back and flank pain laterality not specified, question kidney stone disease EXAM: CT ABDOMEN AND PELVIS WITHOUT CONTRAST TECHNIQUE: Multidetector CT imaging of the abdomen and pelvis was performed following the standard  protocol without IV contrast. Sagittal and coronal MPR images reconstructed from axial data set. No oral contrast administered. COMPARISON:  03/21/2018 FINDINGS: Lower chest: Bibasilar atelectasis versus infiltrate in lower lobes. Hepatobiliary: Gallbladder and liver normal appearance Pancreas: Normal appearance Spleen: Normal appearance Adrenals/Urinary Tract: Adrenal glands and RIGHT kidney normal appearance. LEFT hydronephrosis and hydroureter with perinephric edema. No definite ureteral calcification visualized. Single small focus of gas at upper pole of LEFT kidney. No renal masses or calculi identified. RIGHT ureter  decompressed. Bladder unremarkable; no air in bladder. Stomach/Bowel: Stomach decompressed. Large and small bowel loops unremarkable. Low lying cecum in pelvis. Appendix not visualized. Vascular/Lymphatic: Few pelvic phleboliths. Atherosclerotic calcifications aorta without aneurysm. Prominent LEFT para-aortic lymph nodes at the level of the kidney, largest 11 mm short axis image 34. Reproductive: Unremarkable uterus and ovaries Other: No free air or free fluid.  No hernia. Musculoskeletal: Osseous demineralization. IMPRESSION: LEFT hydronephrosis, hydroureter, and perinephric edema though no definite ureteral calculus is visualized. This could be due to a passed renal calculus. However, a single focus of gas is identified at the upper pole of the LEFT kidney, raising question that these findings could be due to emphysematous pyelonephritis rather than stone disease; recommend correlation with urinalysis. Repeat CT imaging could also be performed with IV contrast if urinalysis is nondiagnostic, which would allow for detection of ureteral enhancement and the typical patchy renal parenchymal enhancement of pyelonephritis. Findings called to Dietrich Pates PA on 09/09/2019 at 1843 hours. Electronically Signed: By: Ulyses Southward M.D. On: 09/09/2019 18:47   IR NEPHROSTOMY PLACEMENT LEFT  Result Date: 09/10/2019 INDICATION: Patient admitted with urosepsis found to have obstructing left-sided ureteral stone. Request made for placement of image guided left-sided percutaneous nephrostomy catheter for infection source control purposes. EXAM: 1. ULTRASOUND GUIDANCE FOR PUNCTURE OF THE LEFT RENAL COLLECTING SYSTEM 2. LEFT PERCUTANEOUS NEPHROSTOMY TUBE PLACEMENT. COMPARISON:  CT abdomen and pelvis - 09/10/2019 MEDICATIONS: Patient admitted to the hospital receiving intravenous antibiotics; the antibiotic was administered in an appropriate time frame prior to skin puncture. ANESTHESIA/SEDATION: None, the patient is currently  intubated CONTRAST:  15 cc Omnipaque 300 administered into the collecting system FLUOROSCOPY TIME:  1 minute, 6 seconds (13 mGy) COMPLICATIONS: None immediate. PROCEDURE: The procedure, risks, benefits, and alternatives were explained to the patient's son. Questions regarding the procedure were encouraged and answered. The patient's son understands and consents to the procedure. A timeout was performed prior to the initiation of the procedure. The left flank region was prepped with Betadine in a sterile fashion, and a sterile drape was applied covering the operative field. A sterile gown and sterile gloves were used for the procedure. Local anesthesia was provided with 1% Lidocaine with epinephrine. Ultrasound was used to localize the left kidney. Under direct ultrasound guidance, a 21 gauge needle was advanced into the renal collecting system. An ultrasound image documentation was performed. Access within the collecting system was confirmed with the efflux of urine followed by contrast injection. Over a Nitrex wire, the tract was dilated with an Accustick stent. Next, a short Amplatz wire was coiled in the left renal collecting system. Under intermittent fluoroscopic guidance, the track was dilated ultimately allowing placement of a 10-French percutaneous nephrostomy catheter with end coiled and locked within left renal pelvis. Contrast was injected and several sport radiographs were obtained in various obliquities confirming access. A small sample of purulent appearing aspirated urine was capped and sent laboratory analysis. The catheter was secured at the skin with a Prolene retention suture and  a gravity bag was placed. A dressing was placed. The patient tolerated procedure well without immediate postprocedural complication. FINDINGS: Ultrasound scanning demonstrates a moderately dilated collecting system as demonstrated on preceding abdominal CT. Under direct ultrasound guidance, a posterior inferior calix was  targeted allowing advancement of an 10-French percutaneous nephrostomy catheter under intermittent fluoroscopic guidance. Contrast injection confirmed appropriate positioning. IMPRESSION: 1. Successful ultrasound and fluoroscopic guided placement of a left sided 10 French PCN. 2. Small sample of aspirated purulent appearing urine was capped and sent to the laboratory for analysis. Electronically Signed   By: Simonne Come M.D.   On: 09/10/2019 09:35    Labs:  CBC: Recent Labs    09/10/19 1355 09/11/19 0400 09/12/19 0313 09/13/19 0342  WBC 14.8* 24.4* 16.2* 14.0*  HGB 7.5* 7.8* 7.1* 7.8*  HCT 23.2* 23.7* 20.6* 23.0*  PLT 115* 74* 48* 42*    COAGS: Recent Labs    09/09/19 1639  INR 1.5*    BMP: Recent Labs    09/10/19 1355 09/11/19 0400 09/12/19 0313 09/13/19 0342  NA 135 137 139 143  K 3.6 5.0 2.9* 2.6*  CL 108 104 100 102  CO2 14* 16* 27 30  GLUCOSE 166* 202* 416* 221*  BUN 34* 42* 68* 67*  CALCIUM 6.7* 6.5* 6.4* 6.4*  CREATININE 2.54* 2.96* 2.58* 1.78*  GFRNONAA 19* 16* 18* 29*  GFRAA 22* 18* 21* 33*    LIVER FUNCTION TESTS: Recent Labs    09/09/19 1639 09/12/19 0313  BILITOT 0.9 1.2  AST 19 207*  ALT 13 144*  ALKPHOS 138* 84  PROT 7.3 5.4*  ALBUMIN 3.4* 2.0*    Assessment and Plan:  History of urosepsis secondary to obstructing left ureteral stone s/p percutaneous left nephrostomy tube placement in IR 09/10/2019 by Dr. Grace Isaac. Left nephrostomy tube stable with approximately 100 cc of clear yellow urine in gravity bag (additional 370 cc output from drain in past 24 hours per chart). Continue current drain management- continue with Qshift flushes/monitor of output. Further plans per [facility]/CCM/urology/nephrology- appreciate and agree with management. Please call IR with questions/concerns.   Electronically Signed: Elwin Mocha, PA-C 09/13/2019, 11:19 AM   I spent a total of 25 Minutes at the the patient's bedside AND on the patient's hospital floor  or unit, greater than 50% of which was counseling/coordinating care for obstructing left ureteral stone s/p left nephrostomy tube placement.

## 2019-09-13 NOTE — Progress Notes (Signed)
[facility] Progress Note Patient Name: Rachel Nelson DOB: 18-Dec-1950 MRN: [id number]   Date of Service  09/13/2019  HPI/Events of Note  Request to change PO KCL to IV.   eICU Interventions  Will order: 1. D/C KCl PO. 2. KCl 40 meq IV over 4 hours.      Intervention Category Major Interventions: Electrolyte abnormality - evaluation and management  Naveh Rickles Eugene 09/13/2019, 7:56 PM

## 2019-09-13 NOTE — Progress Notes (Signed)
[facility] Progress Note Patient Name: Rachel Nelson DOB: 1951/02/06 MRN: [id number]   Date of Service  09/13/2019  HPI/Events of Note  Review of STAT portable CXR: 1. Bilateral interstitial opacities with confluence at the right lung apex suspicious for edema and/or atypical infection. 2. Haziness at the lung bases likely a combination of pleural effusions and atelectasis.  Her fluid balance appears to be 12.5 Liters positive since admission.   eICU Interventions  Will order: 1. Lasix 40 mg IV X 1 now.      Intervention Category Major Interventions: Hypoxemia - evaluation and management  Sutter Ahlgren Cornelia Copa 09/13/2019, 7:41 PM

## 2019-09-13 NOTE — Progress Notes (Signed)
[facility] Progress Note Patient Name: Rachel Nelson DOB: 09/01/1951 MRN: [id number]   Date of Service  09/13/2019  HPI/Events of Note  Pt needs restraints re-ordered  eICU Interventions  Restraints re-ordered        Frederik Pear 09/13/2019, 1:23 AM

## 2019-09-13 NOTE — Progress Notes (Signed)
Results for Rachel Nelson, Rachel Nelson (MRN [id number]) as of 09/13/2019 21:17  Ref. Range 09/13/2019 20:30  FIO2 Unknown PENDING  pH, Arterial Latest Ref Range: 7.350 - 7.450  7.510 (H)  pCO2 arterial Latest Ref Range: 32.0 - 48.0 mmHg 37.9  pO2, Arterial Latest Ref Range: 83.0 - 108.0 mmHg 71.1 (L)  Acid-Base Excess Latest Ref Range: 0.0 - 2.0 mmol/L 6.8 (H)  Bicarbonate Latest Ref Range: 20.0 - 28.0 mmol/L 30.0 (H)  O2 Saturation Latest Units: % 95.6  Patient temperature Unknown 99.1  Allens test (pass/fail) Latest Ref Range: PASS  PASS  ABG results drawn on BiPAP: I-12, E-6, 60% Fi02, RR-18

## 2019-09-13 NOTE — Progress Notes (Signed)
[facility] [place] Progress Note Patient Name: Rachel Nelson DOB: 09/03/1951 MRN: [id number]   Date of Service  09/13/2019  HPI/Events of Note  Acute onset of hypoxia, wheezing and SOB.  Given Albuterol Rx and placed on BiPAP which seems to hove decreased her HR and increased her sats. HR = 128 --> 109 and Sat = 97%.  eICU Interventions  Will order: 1. ABG at 8:30 PM. 2. Portable CXR STAT.     Intervention Category Major Interventions: Hypoxemia - evaluation and management  Elzie Knisley Eugene 09/13/2019, 7:13 PM

## 2019-09-13 NOTE — Progress Notes (Signed)
NAME:  Rachel Nelson, MRN:  [id number], DOB:  03/21/1951, LOS: 3 ADMISSION DATE:  09/09/2019, CONSULTATION DATE: 09/10/2019 REFERRING MD: Allena Katz, CHIEF COMPLAINT: Left flank pain  Brief History   68 y/o F admitted 12/27 with reports of left flank pain, chills and malaise.  Hx of ESBL E Coli in 2018.  Found to be in septic shock on admit with UA suggestive of UTI.  CT imaging showed left ureteral calculi with obstructing hydronephrosis, left emphysematous pyelitis. COVID negative.  The patient was initially admitted to SDU/TRH but decompensated in the ER with worsening hypotension, increased work of breathing requiring intubation & vasopressors.   Past Medical History  Diabetes ESBL E. coli in 2018  Significant Hospital Events   12/27 Admit  12/28 Decompensated with worsening hypotension, resp distress > intubated  12/31 Weaning on PSV  Consults:  Urology  Procedures:  Left IR Perc drain for hydro 12/28 >> L IJ TLC 12/28 >>  ETT 12/28 >>  Significant Diagnostic Tests:  CT Renal Study 12/27 >> mid left ureteral calculi are noted just beyond the gonadal veing crossing, 76mm long segment of clustered calculi measuring up to 61mm in thickness, left emphysematous pyelitis, obstructing hydronephrosis on L ECHO 12/28 >> LVEF ~ 60-65%, grade I diastolic dysfunction, moderately elevated PA systolic pressure  Micro Data:  COVID 12/27 >> negative  UC 12/27 >> E-Coli >> S- imipenem, bactrum, nitrofurantion, otherwise resistant, confirmed ESBL BCID 12/27 >> E coli and Enterobacteriae detected BCx2 12/27 >> E-Coli >>  Antimicrobials:  Meropenem 12/28 >> Vancomycin 12/28 x1   Interim history/subjective:  Tmax 97.5 Off vasopressors  I/O -  Glucose range - 145-416, remains on insulin gtt RN reports on going diarrhea, flexi-seal in place  Objective   Blood pressure 117/68, pulse 73, temperature (!) 97.5 F (36.4 C), resp. rate 18, height 5\' 5"  (1.651 m), weight 74.9 kg, SpO2 96 %.      Vent Mode: PSV FiO2 (%):  [40 %] 40 % Set Rate:  [18 bmp] 18 bmp Vt Set:  [450 mL] 450 mL PEEP:  [5 cmH20] 5 cmH20 Pressure Support:  [5 cmH20-10 cmH20] 10 cmH20 Plateau Pressure:  [18 cmH20-22 cmH20] 18 cmH20   Intake/Output Summary (Last 24 hours) at 09/13/2019 09/15/2019 Last data filed at 09/13/2019 0630 Gross per 24 hour  Intake 3675.84 ml  Output 1620 ml  Net 2055.84 ml   Filed Weights   09/11/19 0500 09/12/19 0342 09/13/19 0500  Weight: 68.5 kg 71.8 kg 74.9 kg    Examination: General: small adult female lying in bed on vent in NAD   HEENT: MM pink/moist, ETT, pupils =/reactive, anicteric Neuro: arouses to voice, looks at provider, does not follow commands (language barrier) but moves all extremities / purposeful  CV: s1s2 RRR, no m/r/g PULM:  Non-labored on vent, lungs bilaterally clear GI: protuberant but soft, bsx4 active  Extremities: warm/dry, no edema  Skin: no rashes or lesions  Resolved Hospital Problem list     Assessment & Plan:   Septic Shock secondary to Obstructive Uropathy with Hydronephrosis, ESBL E-Coli  72mm obstructing stone in left ureter.  Required IR perc drain 12/28. Hx ESBL E-Coli in 2018 -abx as above, renal adjustment per pharmacy  -appreciate IR assistance with percutaneous drainage  -continue stress dose steroids, add stop time for 1/1   Acute Hypoxemic Respiratory Failure CXR with infiltrates, ? ALI from sepsis vs acute pulmonary infection / effusion  -PSV wean with plan for extubation  -FiO2 to support sats >  90% -follow CXR  -continue SBT until son arrives to help with translation (unable to get translator due to dialect), then begin WUA -VAP prevention measures  Need for Sedation secondary to Mechanical Ventilation  -PAD Protocol for RASS Goal 0 -continue precedex -fentanyl for pain  -delirium prevention measures  AKI  Metabolic / Lactic Acidosis  Hyponatremia   Hypokalemia  In setting of obstructive uropathy Trend BMP /  urinary output Replace electrolytes as indicated Avoid nephrotoxic agents, ensure adequate renal perfusion Appreciate Nephrology evaluation   Pancytopenia  Secondary to septic shock -trend CBC -transfuse as appropriate  -continue supportive care -SCD's -SQ heparin resumed 12/31 am   Elevated Troponin  -follow tele  -ECHO as above -suspect this was critical illness related but likely will need cardiac evaluation post discharge  DM  -continue insulin gtt  At Risk Malnutrition  -TF per nutrition    Best practice:  Diet: NPO Pain/Anxiety/Delirium protocol (if indicated): Precedex VAP protocol (if indicated): Ordered DVT prophylaxis: Heparin sq, SCD's  GI prophylaxis: Pepcid Glucose control: SSI Mobility: BR, advance as tolerated  Code Status: Full code Family Communication: Will update Son on arrival 12/31 Disposition: ICU  Labs   CBC: Recent Labs  Lab 09/09/19 1639 09/10/19 0531 09/10/19 1355 09/11/19 0400 09/12/19 0313 09/13/19 0342  WBC 6.2 2.6* 14.8* 24.4* 16.2* 14.0*  NEUTROABS 5.8  --   --   --  14.6*  --   HGB 9.6* 9.2* 7.5* 7.8* 7.1* 7.8*  HCT 29.5* 27.7* 23.2* 23.7* 20.6* 23.0*  MCV 87.3 89.9 89.6 89.1 84.4 86.5  PLT 200 122* 115* 74* 48* 42*    Basic Metabolic Panel: Recent Labs  Lab 09/10/19 0531 09/10/19 1355 09/10/19 1846 09/11/19 0400 09/11/19 1744 09/12/19 0313 09/13/19 0342  NA 132* 135  --  137  --  139 143  K 4.2 3.6  --  5.0  --  2.9* 2.6*  CL 107 108  --  104  --  100 102  CO2 14* 14*  --  16*  --  27 30  GLUCOSE 193* 166*  --  202*  --  416* 221*  BUN 32* 34*  --  42*  --  68* 67*  CREATININE 2.30* 2.54*  --  2.96*  --  2.58* 1.78*  CALCIUM 7.0* 6.7*  --  6.5*  --  6.4* 6.4*  MG  --  1.3* 2.6* 2.5* 2.6* 2.7*  --   PHOS  --  2.0* 3.9 5.1* 4.1 3.1  --    GFR: Estimated Creatinine Clearance: 30.7 mL/min (A) (by C-G formula based on SCr of 1.78 mg/dL (H)). Recent Labs  Lab 09/09/19 2000 09/10/19 0531 09/10/19 0536  09/10/19 1355 09/11/19 0400 09/11/19 1101 09/12/19 0313 09/13/19 0342  PROCALCITON  --   --   --  >150.00 >150.00  --  95.15  --   WBC  --  2.6*  --  14.8* 24.4*  --  16.2* 14.0*  LATICACIDVEN 1.7 4.1* 3.2*  --   --  3.9*  --   --     Liver Function Tests: Recent Labs  Lab 09/09/19 1639 09/12/19 0313  AST 19 207*  ALT 13 144*  ALKPHOS 138* 84  BILITOT 0.9 1.2  PROT 7.3 5.4*  ALBUMIN 3.4* 2.0*   No results for input(s): LIPASE, AMYLASE in the last 168 hours. No results for input(s): AMMONIA in the last 168 hours.  ABG    Component Value Date/Time   PHART 7.286 (L) 09/10/2019  1232   PCO2ART 28.0 (L) 09/10/2019 1232   PO2ART 60.2 (L) 09/10/2019 1232   HCO3 12.9 (L) 09/10/2019 1232   ACIDBASEDEF 12.2 (H) 09/10/2019 1232   O2SAT 88.2 09/10/2019 1232     Coagulation Profile: Recent Labs  Lab 09/09/19 1639  INR 1.5*    Cardiac Enzymes: No results for input(s): CKTOTAL, CKMB, CKMBINDEX, TROPONINI in the last 168 hours.  HbA1C: Hgb A1c MFr Bld  Date/Time Value Ref Range Status  09/10/2019 05:31 AM 7.6 (H) 4.8 - 5.6 % Final    Comment:    (NOTE) Pre diabetes:          5.7%-6.4% Diabetes:              >6.4% Glycemic control for   <7.0% adults with diabetes     CBG: Recent Labs  Lab 09/13/19 0318 09/13/19 0420 09/13/19 0527 09/13/19 0626 09/13/19 0753  GLUCAP 203* 188* 164* 226* 226*      Critical care time: 32 minutes    Noe Gens, MSN, NP-C [facility] 09/13/2019, 8:26 AM   Please see [email or web address] for pager details.

## 2019-09-13 NOTE — Progress Notes (Signed)
Patient having frequent PVCs, potassium was 2.6 on BMET on AM labs, replaced with potassium 1V runs and 40 meq per OG tube. Rechecked potassium this afternoon per MD Olarere due to frequent PVCs. Potassium came back at 3.3. MD Hermina Staggers ordered for 40 meq of potassium PO.

## 2019-09-13 NOTE — Progress Notes (Signed)
Notified MD Sommer at approximately 1900 due to patient being very anxious and being in respiratory distress. Patient started to have inspiratory and expiratory wheezes at approximately 1830, patient oxygen requirements increased from 4 to 6L [place].  Notified Respiratory Therapist, Angie, for breathing treatment. Patient still anxious after breathing treatment. Patient desaturating from 95% to 81% briefly but did increase back to 88%. HR 120s-130s ST, RR 30s-40s.  Notified to Angie RT to bring BIPAP to bedside. After MD Oletta Darter reviewed patient, BIPAP ordered. Patient is more relaxed now with BIPAP. O2 Saturations 95-98%, HR 90s, RR low 20s.

## 2019-09-13 NOTE — Progress Notes (Signed)
[facility] Progress Note  Subjective: UOP 1.6 L yesterday, BP's remain stable off pressors. Pt extubated today.  Creat down to 1.78.    Vitals:   09/13/19 1215 09/13/19 1230 09/13/19 1240 09/13/19 1300  BP: 108/64 125/84  105/63  Pulse: 72 81 78 78  Resp: 18 (!) 26 (!) 25 (!) 21  Temp: (!) 97.3 F (36.3 C) (!) 97.5 F (36.4 C) (!) 97.5 F (36.4 C) (!) 97.5 F (36.4 C)  TempSrc:      SpO2: 98% 98% 98% 94%  Weight:      Height:        Exam: Gen extubated Sclera anicteric  No jvd or bruits Chest clear bilat, no rales/ wheezing RRR no MRG Abd soft ntnd no mass or ascites +bs GU foley w/ clear urine Ext 2+ diffuse ext edema x 4 Neuro is alert, Ox 3 , nf    Home meds:  - aspirin 81/ bactrim DS bid  - insulin glargine 10u hs/ metformin xr 500 bid  -  prn's/ vitamins/ supplements   UA 12/27 > 11-20 rbc/ 6-10 wbc, 0-5 epi, few bact, 100 prot  Renal stone CT 12/27 > IMPRESSION: LEFT hydronephrosis, hydroureter, and perinephric edema though no definite ureteral calculus is visualized. This could be due to a passed renal calculus. However, a single focus of gas is identified at the upper pole of the LEFT kidney, raising question that these findings could be due to emphysematous pyelonephritis rather than stone disease   Assessment/ Plan: 1. AKI - last creat in 2018 was 1.2. ATN vs hemodynamic AKI due to shock / hypoperfusion. BP's improved w/ IV abx and perc neph/ sepsis resolved and patient made more urine and AKI is resolving. Still sig edema but w/o resp issues.  Would cont to limit fluids po and IV, and let autodiurese.  If creat improves < 1.5 could consider low dose po lasix 46m-40mg. No other suggestions, will sign off.   2. L sided pyelonephritis w/ hydronephrosis - sp L PCN on 12/28, IV abx 3. Septic shock - weaning pressor support 4. DM2 5. VDRF - extubated 6. Anemia - tranfuse prn       Rachel Nelson 09/13/2019, 2:05 PM  Inpatient  medications: . atorvastatin  20 mg Oral q1800  . chlorhexidine gluconate (MEDLINE KIT)  15 mL Mouth Rinse BID  . Chlorhexidine Gluconate Cloth  6 each Topical Daily  . heparin injection (subcutaneous)  5,000 Units Subcutaneous Q8H  . hydrocortisone sod succinate (SOLU-CORTEF) inj  50 mg Intravenous Q6H  . insulin aspart  1-3 Units Subcutaneous Q4H  . insulin glargine  6 Units Subcutaneous Daily  . mouth rinse  15 mL Mouth Rinse 10 times per day  . sodium chloride flush  10-40 mL Intracatheter Q12H  . sodium chloride flush  3 mL Intravenous Q12H  . sodium chloride flush  5 mL Intracatheter Q8H   . sodium chloride 10 mL/hr at 09/13/19 1300  . dextrose 5 % and 0.45% NaCl 40 mL/hr at 09/13/19 1300  . famotidine (PEPCID) IV Stopped (09/13/19 1134)  . feeding supplement (VITAL AF 1.2 CAL) 1,000 mL (09/13/19 0336)  . fentaNYL infusion INTRAVENOUS Stopped (09/13/19 1102)  . meropenem (MERREM) IV Stopped (09/13/19 1006)   acetaminophen, acetaminophen, albuterol, bisacodyl, docusate, fentaNYL, fentaNYL, ondansetron (ZOFRAN) IV, ondansetron **OR** [DISCONTINUED] ondansetron (ZOFRAN) IV, sodium chloride flush

## 2019-09-14 LAB — GLUCOSE, CAPILLARY
Glucose-Capillary: 207 mg/dL — ABNORMAL HIGH (ref 70–99)
Glucose-Capillary: 299 mg/dL — ABNORMAL HIGH (ref 70–99)
Glucose-Capillary: 286 mg/dL — ABNORMAL HIGH (ref 70–99)
Glucose-Capillary: 225 mg/dL — ABNORMAL HIGH (ref 70–99)
Glucose-Capillary: 171 mg/dL — ABNORMAL HIGH (ref 70–99)
Glucose-Capillary: 147 mg/dL — ABNORMAL HIGH (ref 70–99)
Glucose-Capillary: 142 mg/dL — ABNORMAL HIGH (ref 70–99)
Glucose-Capillary: 136 mg/dL — ABNORMAL HIGH (ref 70–99)
Glucose-Capillary: 136 mg/dL — ABNORMAL HIGH (ref 70–99)
Glucose-Capillary: 140 mg/dL — ABNORMAL HIGH (ref 70–99)
Glucose-Capillary: 126 mg/dL — ABNORMAL HIGH (ref 70–99)

## 2019-09-14 LAB — CULTURE, BLOOD (ROUTINE X 2)

## 2019-09-14 LAB — BASIC METABOLIC PANEL
Anion gap: 13 (ref 5–15)
BUN: 56 mg/dL — ABNORMAL HIGH (ref 8–23)
CO2: 30 mmol/L (ref 22–32)
Calcium: 6.4 mg/dL — CL (ref 8.9–10.3)
Chloride: 104 mmol/L (ref 98–111)
Creatinine, Ser: 1.5 mg/dL — ABNORMAL HIGH (ref 0.44–1.00)
GFR calc Af Amer: 41 mL/min — ABNORMAL LOW (ref >60)
GFR calc non Af Amer: 35 mL/min — ABNORMAL LOW (ref >60)
Glucose, Bld: 244 mg/dL — ABNORMAL HIGH (ref 70–99)
Potassium: 3.6 mmol/L (ref 3.5–5.1)
Sodium: 147 mmol/L — ABNORMAL HIGH (ref 135–145)

## 2019-09-14 LAB — CBC
HCT: 24.4 % — ABNORMAL LOW (ref 36.0–46.0)
Hemoglobin: 7.8 g/dL — ABNORMAL LOW (ref 12.0–15.0)
MCH: 28 pg (ref 26.0–34.0)
MCHC: 32 g/dL (ref 30.0–36.0)
MCV: 87.5 fL (ref 80.0–100.0)
Platelets: 76 10*3/uL — ABNORMAL LOW (ref 150–400)
RBC: 2.79 MIL/uL — ABNORMAL LOW (ref 3.87–5.11)
RDW: 15.1 % (ref 11.5–15.5)
WBC: 20.2 10*3/uL — ABNORMAL HIGH (ref 4.0–10.5)
nRBC: 0.1 % (ref 0.0–0.2)

## 2019-09-14 MED ORDER — LORAZEPAM 2 MG/ML IJ SOLN
0.5000 mg | Freq: Two times a day (BID) | INTRAMUSCULAR | Status: DC | PRN
  Administered 2019-09-14 – 2019-09-17 (×4): 0.5 mg via INTRAVENOUS
  Filled 2019-09-14 (×4): qty 1

## 2019-09-14 MED ORDER — INSULIN ASPART 100 UNIT/ML ~~LOC~~ SOLN
3.0000 [IU] | SUBCUTANEOUS | Status: DC
  Administered 2019-09-15: 6 [IU] via SUBCUTANEOUS
  Administered 2019-09-15 – 2019-09-16 (×5): 3 [IU] via SUBCUTANEOUS
  Administered 2019-09-17 (×2): 6 [IU] via SUBCUTANEOUS
  Administered 2019-09-17: 9 [IU] via SUBCUTANEOUS
  Administered 2019-09-18 (×2): 6 [IU] via SUBCUTANEOUS

## 2019-09-14 MED ORDER — SODIUM CHLORIDE 0.9 % IV SOLN: INTRAVENOUS | Status: DC

## 2019-09-14 MED ORDER — CALCIUM GLUCONATE-NACL 1-0.675 GM/50ML-% IV SOLN
1.0000 g | Freq: Once | INTRAVENOUS | Status: AC
  Administered 2019-09-14: 1000 mg via INTRAVENOUS
  Filled 2019-09-14: qty 50

## 2019-09-14 MED ORDER — INSULIN REGULAR(HUMAN) IN NACL 100-0.9 UT/100ML-% IV SOLN
INTRAVENOUS | Status: DC
  Administered 2019-09-14: 12 [IU]/h via INTRAVENOUS
  Filled 2019-09-14: qty 100

## 2019-09-14 MED ORDER — INSULIN GLARGINE 100 UNIT/ML ~~LOC~~ SOLN
10.0000 [IU] | Freq: Two times a day (BID) | SUBCUTANEOUS | Status: DC
  Administered 2019-09-14: 10 [IU] via SUBCUTANEOUS
  Filled 2019-09-14 (×2): qty 0.1

## 2019-09-14 MED ORDER — FUROSEMIDE 10 MG/ML IJ SOLN: 40.0000 mg | Freq: Two times a day (BID) | INTRAMUSCULAR | Status: DC

## 2019-09-14 MED ORDER — DEXTROSE-NACL 5-0.45 % IV SOLN: INTRAVENOUS | Status: DC

## 2019-09-14 MED ORDER — FUROSEMIDE 10 MG/ML IJ SOLN
INTRAMUSCULAR | Status: AC
  Administered 2019-09-14: 40 mg
  Filled 2019-09-14: qty 4

## 2019-09-14 MED ORDER — DEXTROSE 50 % IV SOLN: 0.0000 mL | INTRAVENOUS | Status: DC | PRN

## 2019-09-14 MED ORDER — INSULIN DETEMIR 100 UNIT/ML ~~LOC~~ SOLN
10.0000 [IU] | Freq: Two times a day (BID) | SUBCUTANEOUS | Status: DC
  Filled 2019-09-14: qty 0.1

## 2019-09-14 MED ORDER — ALPRAZOLAM 0.25 MG PO TABS
0.2500 mg | ORAL_TABLET | Freq: Three times a day (TID) | ORAL | Status: DC | PRN
  Administered 2019-09-14: 0.25 mg via ORAL
  Filled 2019-09-14: qty 1

## 2019-09-14 MED ORDER — INSULIN GLARGINE 100 UNIT/ML ~~LOC~~ SOLN
10.0000 [IU] | Freq: Two times a day (BID) | SUBCUTANEOUS | Status: DC
  Administered 2019-09-14 – 2019-09-16 (×5): 10 [IU] via SUBCUTANEOUS
  Filled 2019-09-14 (×6): qty 0.1

## 2019-09-14 MED ORDER — LORAZEPAM BOLUS VIA INFUSION: 0.5000 mg | Freq: Two times a day (BID) | INTRAVENOUS | Status: DC | PRN

## 2019-09-14 MED ORDER — FUROSEMIDE 10 MG/ML IJ SOLN
40.0000 mg | Freq: Two times a day (BID) | INTRAMUSCULAR | Status: DC
  Administered 2019-09-14 – 2019-09-18 (×8): 40 mg via INTRAVENOUS
  Filled 2019-09-14 (×8): qty 4

## 2019-09-14 NOTE — Progress Notes (Signed)
RT called STAT to pts. room per staff member notifying this RT that pt. pulled off BiPAP mask and is desaturating into the 60's, this RT notified staff member to have NRB mask placed on pt. ASAP, upon my arrival pt. was on NRB mask via oxygen tank with saturations increasing, this RT located additional double flowmeter and placed in pts. room to allow both BiPAP and additional oxygen device,(NRB mask) to be utilized if needed.

## 2019-09-14 NOTE — Progress Notes (Signed)
Last blood sugar was 136, per endo tool phase 3 will be started. Notified Dr Wynona Neat and he gave verbal order for resistant sliding scale and lantus 10units every 12 hours.

## 2019-09-14 NOTE — Evaluation (Signed)
Occupational Therapy Evaluation Patient Details Name: Rachel Nelson MRN: [id number] DOB: 1951/03/22 Today's Date: 09/14/2019    History of Present Illness Pt is a 69 year old woman admitted on 09/10/19 with septic shock due to UTI, obstructing hydronephrosis and L pyelitis.  Increased WOB and hypotension requring intubation from 12/28 to 12/31. PMH: ESBL Ecoli in 2018, DM.    Clinical Impression   Pt was independent and active prior to admission. Presents with significant weakness, activity tolerance and inability to stand. Pt requires +2 for all mobility and up to total assist for ADL. Pt with Sp02 of 92% on 10L  HFNC throughout session. Denies pain, but moaning throughout session. Pt with supportive family. Recommending CIR for further rehab. Will follow acutely.    Follow Up Recommendations  CIR    Equipment Recommendations  None recommended by OT    Recommendations for Other Services       Precautions / Restrictions Precautions Precautions: Fall Precaution Comments: on 10 L HFNC, L nephrostomy, rectal pouch Restrictions Weight Bearing Restrictions: No      Mobility Bed Mobility Overal bed mobility: Needs Assistance Bed Mobility: Supine to Sit     Supine to sit: +2 for physical assistance;Max assist     General bed mobility comments: pt initiating LEs over EOB, needed assist to complete, raise trunk and position hips at EOB with bed pad  Transfers Overall transfer level: Needs assistance Equipment used: 2 person hand held assist Transfers: Sit to/from [employer] Sit to Stand: +2 physical assistance;Max assist   Squat pivot transfers: +2 physical assistance;Max assist     General transfer comment: pt unable to fully stand, assist to rise and pivot from bed to chair      Balance Overall balance assessment: Needs assistance   Sitting balance-Leahy Scale: Fair       Standing balance-Leahy Scale: Zero                              ADL either performed or assessed with clinical judgement   ADL Overall ADL's : Needs assistance/impaired Eating/Feeding: Maximal assistance(sips with spoon)   Grooming: Moderate assistance;Sitting   Upper Body Bathing: Maximal assistance;Sitting   Lower Body Bathing: Total assistance;+2 for physical assistance;Sit to/from stand   Upper Body Dressing : Maximal assistance;Sitting   Lower Body Dressing: Total assistance;+2 for physical assistance;Sit to/from stand   Toilet Transfer: +2 for physical assistance;Maximal assistance;Squat-pivot   Toileting- Architect and Hygiene: Total assistance;+2 for physical assistance;Sit to/from stand               Vision Patient Visual Report: No change from baseline       Perception     Praxis      Pertinent Vitals/Pain Pain Assessment: Faces Faces Pain Scale: No hurt     Hand Dominance Right   Extremity/Trunk Assessment Upper Extremity Assessment Upper Extremity Assessment: Generalized weakness   Lower Extremity Assessment Lower Extremity Assessment: Defer to PT evaluation       Communication Communication Communication: Prefers language other than English(son interpreted)   Cognition Arousal/Alertness: Awake/alert Behavior During Therapy: Flat affect(groans, but son states this is her baseline) Overall Cognitive Status: Difficult to assess                                     General Comments       Exercises  Shoulder Instructions      Home Living Family/patient expects to be discharged to:: Private residence Living Arrangements: Children;Other relatives Available Help at Discharge: Family;Available 24 hours/day Type of Home: House Home Access: Stairs to enter [employer] of Steps: 2   Home Layout: One level     Bathroom Shower/Tub: Teacher, early years/pre: Standard     Home Equipment: Environmental consultant - 2 wheels;Bedside commode;Shower seat;Wheelchair -  manual   Additional Comments: equipment was pt's husband's      Prior Functioning/Environment Level of Independence: Independent                 OT Problem List: Decreased strength;Decreased activity tolerance;Impaired balance (sitting and/or standing);Decreased knowledge of use of DME or AE      OT Treatment/Interventions: Self-care/ADL training;DME and/or AE instruction;Therapeutic activities;Patient/family education;Balance training    OT Goals(Current goals can be found in the care plan section) Acute Rehab OT Goals Patient Stated Goal: did not state, agreeable to OOB with therapy OT Goal Formulation: With family Time For Goal Achievement: 09/28/19 Potential to Achieve Goals: Good ADL Goals Pt Will Perform Grooming: with set-up;sitting Pt Will Perform Upper Body Dressing: with supervision;with set-up;sitting Pt Will Perform Lower Body Dressing: with mod assist;sit to/from stand Pt Will Transfer to Toilet: with mod assist;stand pivot transfer;bedside commode Pt Will Perform Toileting - Clothing Manipulation and hygiene: with mod assist;sit to/from stand Additional ADL Goal #1: Pt will perform bed mobility with min assist in preparation for ADL.  OT Frequency: Min 2X/week   Barriers to D/C:            Co-evaluation PT/OT/SLP Co-Evaluation/Treatment: Yes Reason for Co-Treatment: Complexity of the patient's impairments (multi-system involvement)   OT goals addressed during session: ADL's and self-care;Strengthening/ROM      AM-PAC OT "6 Clicks" Daily Activity     Outcome Measure Help from another person eating meals?: A Lot Help from another person taking care of personal grooming?: A Lot Help from another person toileting, which includes using toliet, bedpan, or urinal?: Total Help from another person bathing (including washing, rinsing, drying)?: A Lot Help from another person to put on and taking off regular upper body clothing?: A Lot Help from another person  to put on and taking off regular lower body clothing?: Total 6 Click Score: 10   End of Session Equipment Utilized During Treatment: Oxygen(10 L) Nurse Communication: Mobility status  Activity Tolerance: Patient limited by fatigue Patient left: in chair;with call bell/phone within reach;with family/visitor present  OT Visit Diagnosis: Unsteadiness on feet (R26.81);Muscle weakness (generalized) (M62.81)                Time: 3419-3790 OT Time Calculation (min): 19 min Charges:  OT General Charges $OT Visit: 1 Visit OT Evaluation $OT Eval Moderate Complexity: 1 Mod  Nestor Lewandowsky, OTR/L Acute Rehabilitation Services Pager: [phone] Office: [phone]  Malka So 09/14/2019, 11:19 AM

## 2019-09-14 NOTE — Evaluation (Signed)
Physical Therapy Evaluation Patient Details Name: Rachel Nelson MRN: [id number] DOB: Nov 10, 1950 Today's Date: 09/14/2019   History of Present Illness  Pt is a 69 year old woman admitted on 09/10/19 with septic shock due to UTI, obstructing hydronephrosis and L pyelitis.  Increased WOB and hypotension requring intubation from 12/28 to 12/31. PMH: ESBL Ecoli in 2018, DM.   Clinical Impression  Pt admitted with above diagnosis.  Pt currently with functional limitations due to the deficits listed below (see PT Problem List). Pt will benefit from skilled PT to increase their independence and safety with mobility to allow discharge to the venue listed below.  Pt's son reports pt was able to run with him last week.  Pt currently very weak and unable to stand without significant assist.  Pt also on 10 L HFNC and Spo2 92% after transfer to recliner.  Son present and assisted with translating.  Pt would benefit from CIR upon d/c.     Follow Up Recommendations CIR    Equipment Recommendations  None recommended by PT    Recommendations for Other Services       Precautions / Restrictions Precautions Precautions: Fall Precaution Comments: on 10 L HFNC, L nephrostomy, flexiseal Restrictions Weight Bearing Restrictions: No      Mobility  Bed Mobility Overal bed mobility: Needs Assistance Bed Mobility: Supine to Sit     Supine to sit: +2 for physical assistance;Max assist     General bed mobility comments: pt initiating LEs over EOB, needed assist to complete, raise trunk and position hips at EOB with bed pad  Transfers Overall transfer level: Needs assistance Equipment used: 2 person hand held assist Transfers: Sit to/from [employer] Transfers Sit to Stand: +2 physical assistance;Max assist   Squat pivot transfers: +2 physical assistance;Max assist     General transfer comment: pt unable to fully stand, assist to rise and pivot from bed to chair  Ambulation/Gait                 Stairs            Wheelchair Mobility    Modified Rankin (Stroke Patients Only)       Balance Overall balance assessment: Needs assistance Sitting-balance support: No upper extremity supported;Feet supported Sitting balance-Leahy Scale: Fair     Standing balance support: Bilateral upper extremity supported Standing balance-Leahy Scale: Zero                               Pertinent Vitals/Pain Pain Assessment: Faces Faces Pain Scale: No hurt Pain Intervention(s): Repositioned;Monitored during session    Home Living Family/patient expects to be discharged to:: Private residence Living Arrangements: Children;Other relatives Available Help at Discharge: Family;Available 24 hours/day Type of Home: House Home Access: Stairs to enter   [employer] of Steps: 2 Home Layout: One level Home Equipment: Walker - 2 wheels;Bedside commode;Shower seat;Wheelchair - manual Additional Comments: equipment was pt's husband's    Prior Function Level of Independence: Independent               Hand Dominance   Dominant Hand: Right    Extremity/Trunk Assessment   Upper Extremity Assessment Upper Extremity Assessment: Generalized weakness    Lower Extremity Assessment Lower Extremity Assessment: Generalized weakness    Cervical / Trunk Assessment Cervical / Trunk Assessment: Normal  Communication   Communication: Prefers language other than English(son interpreted)  Cognition Arousal/Alertness: Awake/alert Behavior During Therapy: Flat affect(groans, but  son states this is her baseline) Overall Cognitive Status: Difficult to assess                                        General Comments      Exercises     Assessment/Plan    PT Assessment Patient needs continued PT services  PT Problem List Decreased strength;Decreased mobility;Decreased activity tolerance;Decreased balance;Decreased knowledge of use of  DME;Cardiopulmonary status limiting activity;Decreased coordination       PT Treatment Interventions DME instruction;Therapeutic activities;Functional mobility training;Balance training;Gait training;Therapeutic exercise;Patient/family education;Wheelchair mobility training    PT Goals (Current goals can be found in the Care Plan section)  Acute Rehab PT Goals Patient Stated Goal: did not state, agreeable to OOB with therapy PT Goal Formulation: With patient/family Time For Goal Achievement: 09/28/19 Potential to Achieve Goals: Good    Frequency Min 3X/week   Barriers to discharge        Co-evaluation PT/OT/SLP Co-Evaluation/Treatment: Yes Reason for Co-Treatment: Complexity of the patient's impairments (multi-system involvement) PT goals addressed during session: Mobility/safety with mobility OT goals addressed during session: ADL's and self-care;Strengthening/ROM       AM-PAC PT "6 Clicks" Mobility  Outcome Measure Help needed turning from your back to your side while in a flat bed without using bedrails?: A Lot Help needed moving from lying on your back to sitting on the side of a flat bed without using bedrails?: A Lot Help needed moving to and from a bed to a chair (including a wheelchair)?: A Lot Help needed standing up from a chair using your arms (e.g., wheelchair or bedside chair)?: Total Help needed to walk in hospital room?: Total Help needed climbing 3-5 steps with a railing? : Total 6 Click Score: 9    End of Session Equipment Utilized During Treatment: Oxygen Activity Tolerance: Patient limited by fatigue Patient left: in chair;with chair alarm set;with call bell/phone within reach;with family/visitor present Nurse Communication: Mobility status PT Visit Diagnosis: Other abnormalities of gait and mobility (R26.89)    Time: 0998-3382 PT Time Calculation (min) (ACUTE ONLY): 19 min   Charges:   PT Evaluation $PT Eval Moderate Complexity: 1 Mod          Kati PT, DPT Acute Rehabilitation Services Office: [phone]  Trena Platt 09/14/2019, 12:04 PM

## 2019-09-14 NOTE — Progress Notes (Addendum)
eLink Physician-Brief Progress Note Patient Name: Rachel Nelson DOB: August 11, 1951 MRN: [id number]   Date of Service  09/14/2019  HPI/Events of Note  Oxygen feed became disconnected from BiPAP machine. Patient's Sats dropped into the 40's and HR increased to 120's (sinus tachycardia). Sats now back up to 97% back on BiPAP with Oxygen after 1-2 minutes. Patient had about a 3 liter diuresis after Lasix last evening.   eICU Interventions  Will order: 1. Please place patient back on BiPAP with Oxygen. 2. Lasix 40 mg IV now.      Intervention Category Major Interventions: Hypoxemia - evaluation and management  Ayodeji Keimig Eugene 09/14/2019, 6:25 AM

## 2019-09-14 NOTE — Progress Notes (Signed)
CRITICAL VALUE ALERT  Critical Value:  Ca 6.4  Date & Time Notied:  0520  Provider Notified: E-link and Dr. Dellie Catholic notified.   Orders Received/Actions taken: Dr. Dellie Catholic placed orders for 1 run of Ca IV. RN will continue to monitor.

## 2019-09-14 NOTE — Progress Notes (Signed)
NAME:  Rachel Nelson, MRN:  [id number], DOB:  03-21-1951, LOS: 4 ADMISSION DATE:  09/09/2019, CONSULTATION DATE: 09/10/2019 REFERRING MD: Posey Pronto, CHIEF COMPLAINT: Left flank pain  Brief History   69 y/o F admitted 12/27 with reports of left flank pain, chills and malaise.  Hx of ESBL E Coli in 2018.  Found to be in septic shock on admit with UA suggestive of UTI.  CT imaging showed left ureteral calculi with obstructing hydronephrosis, left emphysematous pyelitis. COVID negative.  The patient was initially admitted to SDU/TRH but decompensated in the ER with worsening hypotension, increased work of breathing requiring intubation & vasopressors.  Extubated 09/13/2019  Past Medical History  Diabetes ESBL E. coli in 2018  [facility] Events   12/27 Admit  12/28 Decompensated with worsening hypotension, resp distress > intubated  12/31 Weaning on PSV 12/31 extubated, decompensated in the evening and not to be on BiPAP Consults:  Urology  Procedures:  Left IR Perc drain for hydro 12/28 >> L IJ TLC 12/28 >>  ETT 12/28 >>  Significant Diagnostic Tests:  CT Renal Study 12/27 >> mid left ureteral calculi are noted just beyond the gonadal veing crossing, 57mm long segment of clustered calculi measuring up to 29mm in thickness, left emphysematous pyelitis, obstructing hydronephrosis on L ECHO 12/28 >> LVEF ~ 18-56%, grade I diastolic dysfunction, moderately elevated PA systolic pressure  Micro Data:  COVID 12/27 >> negative  UC 12/27 >> E-Coli >> S- imipenem, bactrum, nitrofurantion, otherwise resistant, confirmed ESBL BCID 12/27 >> E coli and Enterobacteriae detected BCx2 12/27 >> E-Coli >>  Antimicrobials:  Meropenem 12/28 >>( day 6) Vancomycin 12/28 x1   Interim history/subjective:  Tmax 99.1 Off vasopressors  Anxiety Denies pain or discomfort  Objective   Blood pressure (!) 147/70, pulse 89, temperature 98.2 F (36.8 C), temperature source Bladder, resp. rate (!) 27,  height 5\' 5"  (1.651 m), weight 72.1 kg, SpO2 (!) 88 %.    Vent Mode: PSV FiO2 (%):  [40 %-60 %] 60 % PEEP:  [5 cmH20] 5 cmH20 Pressure Support:  [5 cmH20] 5 cmH20   Intake/Output Summary (Last 24 hours) at 09/14/2019 0927 Last data filed at 09/14/2019 0800 Gross per 24 hour  Intake 1162.51 ml  Output 4620 ml  Net -3457.49 ml   Filed Weights   09/12/19 0342 09/13/19 0500 09/14/19 0425  Weight: 71.8 kg 74.9 kg 72.1 kg    Examination: General: Adult female, does appear anxious HEENT: Moist oral mucosa Neuro: Following commands, answering questions CV: S1-S2 appreciated PULM: Some rhonchi with rales at the bases GI: Protuberant, bowel sounds appreciated, nontender Extremities: Warm and dry Skin: no rashes or lesions  Resolved Hospital Problem list     Assessment & Plan:   Septic shock secondary to obstructive uropathy with hydronephrosis, ESBL E. coli -15 mm obstructing stone in the left ureter required percutaneous drain placement 12/28 -History of ESBL E. coli in 2018 -Continue meropenem -Stress dose steroids to be completed today  Acute hypoxic respiratory failure -Requiring BiPAP pressure support -Maintain saturations greater than 90% -Monitor chest x-rays -We will use BiPAP as needed for increased work of breathing  Anxiety -Low-dose Xanax  Acute kidney injury Metabolic acidosis Hypokalemia/hyponatremia -Trend electrolytes -Replace electrolytes as needed -Avoid nephrotoxic's -Appreciate nephrology assistance  Pancytopenia -Secondary to sepsis -Transfuse per protocol -Continue subcu heparin  Elevated troponin -Echocardiogram noted -Cardiac evaluation when critical illness stabilizes  Diabetes -We will increase insulin coverage-increased Lantus to 10 twice daily -SSI sensitive scale  Encourage oral  intake  Best practice:  Diet: We will advance as tolerated Pain/Anxiety/Delirium protocol (if indicated): Xanax VAP protocol (if indicated): Not  applicable DVT prophylaxis: Heparin sq, SCD's  GI prophylaxis: Pepcid Glucose control: SSI Mobility: BR, advance as tolerated  Code Status: Full code Family Communication: Discussed with son at bedside Disposition: ICU  Labs   CBC: Recent Labs  Lab 09/09/19 1639 09/10/19 1355 09/11/19 0400 09/12/19 0313 09/13/19 0342 09/14/19 0411  WBC 6.2 14.8* 24.4* 16.2* 14.0* 20.2*  NEUTROABS 5.8  --   --  14.6*  --   --   HGB 9.6* 7.5* 7.8* 7.1* 7.8* 7.8*  HCT 29.5* 23.2* 23.7* 20.6* 23.0* 24.4*  MCV 87.3 89.6 89.1 84.4 86.5 87.5  PLT 200 115* 74* 48* 42* 76*    Basic Metabolic Panel: Recent Labs  Lab 09/10/19 1355 09/10/19 1846 09/11/19 0400 09/11/19 1744 09/12/19 0313 09/13/19 0342 09/13/19 1720 09/14/19 0411  NA 135  --  137  --  139 143 147* 147*  K 3.6  --  5.0  --  2.9* 2.6* 3.3* 3.6  CL 108  --  104  --  100 102 111 104  CO2 14*  --  16*  --  27 30 29 30   GLUCOSE 166*  --  202*  --  416* 221* 179* 244*  BUN 34*  --  42*  --  68* 67* 61* 56*  CREATININE 2.54*  --  2.96*  --  2.58* 1.78* 1.47* 1.50*  CALCIUM 6.7*  --  6.5*  --  6.4* 6.4* 6.5* 6.4*  MG 1.3* 2.6* 2.5* 2.6* 2.7*  --   --   --   PHOS 2.0* 3.9 5.1* 4.1 3.1  --   --   --    GFR: Estimated Creatinine Clearance: 35.7 mL/min (A) (by C-G formula based on SCr of 1.5 mg/dL (H)). Recent Labs  Lab 09/09/19 2000 09/10/19 0531 09/10/19 0536 09/10/19 1355 09/11/19 0400 09/11/19 1101 09/12/19 0313 09/13/19 0342 09/14/19 0411  PROCALCITON  --   --   --  >150.00 >150.00  --  95.15  --   --   WBC  --  2.6*  --  14.8* 24.4*  --  16.2* 14.0* 20.2*  LATICACIDVEN 1.7 4.1* 3.2*  --   --  3.9*  --   --   --     Liver Function Tests: Recent Labs  Lab 09/09/19 1639 09/12/19 0313  AST 19 207*  ALT 13 144*  ALKPHOS 138* 84  BILITOT 0.9 1.2  PROT 7.3 5.4*  ALBUMIN 3.4* 2.0*   No results for input(s): LIPASE, AMYLASE in the last 168 hours. No results for input(s): AMMONIA in the last 168 hours.  ABG      Component Value Date/Time   PHART 7.510 (H) 09/13/2019 2030   PCO2ART 37.9 09/13/2019 2030   PO2ART 71.1 (L) 09/13/2019 2030   HCO3 30.0 (H) 09/13/2019 2030   ACIDBASEDEF 12.2 (H) 09/10/2019 1232   O2SAT 95.6 09/13/2019 2030     Coagulation Profile: Recent Labs  Lab 09/09/19 1639  INR 1.5*    Cardiac Enzymes: No results for input(s): CKTOTAL, CKMB, CKMBINDEX, TROPONINI in the last 168 hours.  HbA1C: Hgb A1c MFr Bld  Date/Time Value Ref Range Status  09/10/2019 05:31 AM 7.6 (H) 4.8 - 5.6 % Final    Comment:    (NOTE) Pre diabetes:          5.7%-6.4% Diabetes:              >  6.4% Glycemic control for   <7.0% adults with diabetes     CBG: Recent Labs  Lab 09/13/19 1452 09/13/19 1608 09/13/19 1659 09/13/19 1951 09/13/19 2313  GLUCAP 136* 157* 151* 221* 214*   The patient is critically ill with multiple organ systems failure and requires high complexity decision making for assessment and support, frequent evaluation and titration of therapies, application of advanced monitoring technologies and extensive interpretation of multiple databases. Critical Care Time devoted to patient care services described in this note independent of APP/resident time (if applicable)  is 32 minutes.   Virl Diamond MD [facility] Personal pager: [phone] If unanswered, please page CCM On-call: #[phone]

## 2019-09-14 NOTE — Progress Notes (Signed)
Rt took pt off BIPAP for and small break with son at bedside.

## 2019-09-14 NOTE — Progress Notes (Signed)
Rehab Admissions Coordinator Note:  Per PT and OT recommendation, this patient was screened by Cheri Rous for appropriateness for an Inpatient Acute Rehab Consult. At this time, pt is not quite ready for rehab consult. We will follow for medical stability and continued progress with therapies prior to requesting rehab consult.   Cheri Rous 09/14/2019, 1:23 PM  I can be reached at [phone].

## 2019-09-14 NOTE — Progress Notes (Signed)
Per Dr Wynona Neat insulin will not be started for blood sugar of 299. Lantus has been increased. Will continue to monitor blood sugars every four hours.

## 2019-09-14 NOTE — Progress Notes (Signed)
RN noticed patient's O2 sat drop to the 60s on the monitor. Upon entering the room, BIPAP had been removed by pt, and it appeared to be disconnected from the wall's O2 hookup. Patient was tachypnic, tachycardic and anxious. RT was called by RN. E-link was also called to camera into the room and connect to Dr. Arsenio Loader. Pt was placed on NRBM while BIPAP was reconnected to the wall's O2 by RT. O2 sat went back up to 97% on the BIPAP with 60% FiO2. HR returned to NSR. RN remained with patient until anxiety decreased and work of breathing returned to normal. 40 mg of Lasix was also given IVP per MD orders. RN will continue to monitor pt and O2 sat.

## 2019-09-14 NOTE — Progress Notes (Signed)
eLink Physician-Brief Progress Note Patient Name: Rachel Nelson DOB: November 08, 1950 MRN: [id number]   Date of Service  09/14/2019  HPI/Events of Note  Hypocalcemia - Ca++ = 6.4 which corrects to 8.0 (Low) given albumin = 2.0.  eICU Interventions  Will replace Ca++.      Intervention Category Major Interventions: Electrolyte abnormality - evaluation and management  Chee Kinslow Eugene 09/14/2019, 5:49 AM

## 2019-09-15 ENCOUNTER — Inpatient Hospital Stay (HOSPITAL_COMMUNITY): Payer: BC Managed Care – PPO

## 2019-09-15 LAB — CBC WITH DIFFERENTIAL/PLATELET
Abs Immature Granulocytes: 1.25 10*3/uL — ABNORMAL HIGH (ref 0.00–0.07)
Basophils Absolute: 0.1 10*3/uL (ref 0.0–0.1)
Basophils Relative: 0 %
Eosinophils Absolute: 0.1 10*3/uL (ref 0.0–0.5)
Eosinophils Relative: 0 %
HCT: 24.7 % — ABNORMAL LOW (ref 36.0–46.0)
Hemoglobin: 7.9 g/dL — ABNORMAL LOW (ref 12.0–15.0)
Immature Granulocytes: 6 %
Lymphocytes Relative: 16 %
Lymphs Abs: 3.5 10*3/uL (ref 0.7–4.0)
MCH: 28.7 pg (ref 26.0–34.0)
MCHC: 32 g/dL (ref 30.0–36.0)
MCV: 89.8 fL (ref 80.0–100.0)
Monocytes Absolute: 0.8 10*3/uL (ref 0.1–1.0)
Monocytes Relative: 4 %
Neutro Abs: 16.7 10*3/uL — ABNORMAL HIGH (ref 1.7–7.7)
Neutrophils Relative %: 74 %
Platelets: 125 10*3/uL — ABNORMAL LOW (ref 150–400)
RBC: 2.75 MIL/uL — ABNORMAL LOW (ref 3.87–5.11)
RDW: 15.3 % (ref 11.5–15.5)
WBC: 22.4 10*3/uL — ABNORMAL HIGH (ref 4.0–10.5)
nRBC: 0.1 % (ref 0.0–0.2)

## 2019-09-15 LAB — GLUCOSE, CAPILLARY
Glucose-Capillary: 107 mg/dL — ABNORMAL HIGH (ref 70–99)
Glucose-Capillary: 117 mg/dL — ABNORMAL HIGH (ref 70–99)
Glucose-Capillary: 129 mg/dL — ABNORMAL HIGH (ref 70–99)
Glucose-Capillary: 134 mg/dL — ABNORMAL HIGH (ref 70–99)
Glucose-Capillary: 143 mg/dL — ABNORMAL HIGH (ref 70–99)
Glucose-Capillary: 144 mg/dL — ABNORMAL HIGH (ref 70–99)
Glucose-Capillary: 164 mg/dL — ABNORMAL HIGH (ref 70–99)
Glucose-Capillary: 97 mg/dL (ref 70–99)

## 2019-09-15 LAB — BASIC METABOLIC PANEL
Anion gap: 11 (ref 5–15)
BUN: 48 mg/dL — ABNORMAL HIGH (ref 8–23)
CO2: 35 mmol/L — ABNORMAL HIGH (ref 22–32)
Calcium: 6.9 mg/dL — ABNORMAL LOW (ref 8.9–10.3)
Chloride: 102 mmol/L (ref 98–111)
Creatinine, Ser: 1.23 mg/dL — ABNORMAL HIGH (ref 0.44–1.00)
GFR calc Af Amer: 52 mL/min — ABNORMAL LOW (ref >60)
GFR calc non Af Amer: 45 mL/min — ABNORMAL LOW (ref >60)
Glucose, Bld: 118 mg/dL — ABNORMAL HIGH (ref 70–99)
Potassium: 2.5 mmol/L — CL (ref 3.5–5.1)
Sodium: 148 mmol/L — ABNORMAL HIGH (ref 135–145)

## 2019-09-15 LAB — MAGNESIUM: Magnesium: 1.7 mg/dL (ref 1.7–2.4)

## 2019-09-15 MED ORDER — POTASSIUM CHLORIDE 10 MEQ/50ML IV SOLN
10.0000 meq | INTRAVENOUS | Status: AC
  Administered 2019-09-15 (×6): 10 meq via INTRAVENOUS
  Filled 2019-09-15 (×6): qty 50

## 2019-09-15 MED ORDER — MAGNESIUM SULFATE IN D5W 1-5 GM/100ML-% IV SOLN
1.0000 g | Freq: Once | INTRAVENOUS | Status: AC
  Administered 2019-09-15: 1 g via INTRAVENOUS
  Filled 2019-09-15: qty 100

## 2019-09-15 MED ORDER — SODIUM CHLORIDE 0.9 % IV SOLN
2.0000 g | Freq: Two times a day (BID) | INTRAVENOUS | Status: DC
  Administered 2019-09-15 – 2019-09-18 (×6): 2 g via INTRAVENOUS
  Filled 2019-09-15 (×6): qty 2

## 2019-09-15 NOTE — Progress Notes (Signed)
eLink Physician-Brief Progress Note Patient Name: Rachel Nelson DOB: [birth year] MRN: [id number]   Date of Service  09/15/2019  HPI/Events of Note  Hypokalemia / Hypomagnesemia  - K+ = 2.8, Mg++ = 1.7 and Creatinine = 1.23   eICU Interventions  Will replace K+ and Mg++.      Intervention Category Major Interventions: Electrolyte abnormality - evaluation and management  Sherrol Vicars Eugene 09/15/2019, 6:45 AM

## 2019-09-15 NOTE — Progress Notes (Signed)
Subjective/Chief Complaint:  1 - Left Distal Ureteral Stone - fusiform 90mmx7mm distal stone by CT 12/27. lerft neph tube placed 12/28.   2 - Urosepsis / Epmphysematous Pyelitis - florid sepsis form e. Coli sens merrem, zosyn, bactrim by CT 12/27. Placed on [place].   Today Ms. Rachel Nelson continues to improve. Fever curve trending down on merrem and neph tueb drianage.   Objective: Vital signs in last 24 hours: Temp:  [98.2 F (36.8 C)-99.1 F (37.3 C)] 99 F (37.2 C) (01/02 0800) Pulse Rate:  [64-102] 88 (01/02 0806) Resp:  [16-44] 28 (01/02 0806) BP: (125-171)/(52-90) 148/70 (01/02 0800) SpO2:  [88 %-100 %] 99 % (01/02 0806) FiO2 (%):  [60 %-100 %] 60 % (01/02 0400) Weight:  [66.1 kg] 66.1 kg (01/02 0500) Last BM Date: 09/14/19  Intake/Output from previous day: 01/01 0701 - 01/02 0700 In: 1102.5 [I.V.:815.8; IV Piggyback:286.7] Out: 6100 [Urine:6100] Intake/Output this shift: Total I/O In: 130.9 [IV Piggyback:130.9] Out: -   General appearance: IN ICU on BiPAP. Family at bedside.  Eyes: negative Nose: Nares normal. Septum midline. Mucosa normal. No drainage or sinus tenderness. Throat: lips, mucosa, and tongue normal; teeth and gums normal Neck: supple, symmetrical, trachea midline Back: symmetric, no curvature. ROM normal. No CVA tenderness., left neph tueb in place wtih copious non-foul urine. No palpable hematomas / ecchymoses.  Resp: on BiPAP.  Cardio: HR 80s and sinus by monitor.  GI: soft, non-tender; bowel sounds normal; no masses,  no organomegaly and mild obestiy. NO r/g.  Pelvic: external genitalia normal Extremities: extremities normal, atraumatic, no cyanosis or edema Skin: Skin color, texture, turgor normal. No rashes or lesions  Lab Results:  Recent Labs    09/14/19 0411 09/15/19 0541  WBC 20.2* 22.4*  HGB 7.8* 7.9*  HCT 24.4* 24.7*  PLT 76* 125*   BMET Recent Labs    09/14/19 0411 09/15/19 0541  NA 147* 148*  K 3.6 2.5*  CL 104 102  CO2  30 35*  GLUCOSE 244* 118*  BUN 56* 48*  CREATININE 1.50* 1.23*  CALCIUM 6.4* 6.9*   PT/INR No results for input(s): LABPROT, INR in the last 72 hours. ABG Recent Labs    09/13/19 2030  PHART 7.510*  HCO3 30.0*    Studies/Results: DG Chest Port 1 View  Result Date: 09/13/2019 CLINICAL DATA:  SOB and hypoxia today; pt id checked with RN EXAM: PORTABLE CHEST 1 VIEW COMPARISON:  Chest radiograph 09/13/2019 at 4:42 a.m. FINDINGS: Interval extubation and removal of an NG tube. A left central venous catheter remains in place. Stable cardiomediastinal contours. There are bilateral interstitial opacities with confluence at the right lung apex suspicious for edema or atypical infection. Haziness in the bilateral lung bases likely a combination of pleural effusions and atelectasis. No pneumothorax. No acute finding in the visualized skeleton. IMPRESSION: 1. Bilateral interstitial opacities with confluence at the right lung apex suspicious for edema and/or atypical infection. 2. Haziness at the lung bases likely a combination of pleural effusions and atelectasis. Electronically Signed   By: Audie Pinto M.D.   On: 09/13/2019 19:32    Anti-infectives: Anti-infectives (From admission, onward)   Start     Dose/Rate Route Frequency Ordered Stop   09/15/19 2000  meropenem (MERREM) 2 g in sodium chloride 0.9 % 100 mL IVPB     2 g 200 mL/hr over 30 Minutes Intravenous Every 12 hours 09/15/19 0809     09/10/19 0800  meropenem (MERREM) 1 g in sodium chloride 0.9 %  100 mL IVPB  Status:  Discontinued     1 g 200 mL/hr over 30 Minutes Intravenous Every 12 hours 09/10/19 0207 09/15/19 0809   09/09/19 2000  vancomycin (VANCOCIN) IVPB 1000 mg/200 mL premix     1,000 mg 200 mL/hr over 60 Minutes Intravenous  Once 09/09/19 1935 09/09/19 2136   09/09/19 1700  meropenem (MERREM) 1 g in sodium chloride 0.9 % 100 mL IVPB     1 g 200 mL/hr over 30 Minutes Intravenous  Once 09/09/19 1654 09/09/19 1832       Assessment/Plan:  1 - Left Distal Ureteral Stone - will need ureteroscopy in elective setting, not this hospitalization. Continue neph tueb for rnela drainage as temporizing measure. She will go home with neph tube.   2 - Urosepsis / Epmphysematous Pyelitis - improving clinically, though persistant luekocytosis is conerning. Agree with current ABX with goal of 2 week total course given severity. Greatly appreciate all consulting and primary teams. Should clinical status deteriorate, consier repeat CT to r/o new fluid collection / abscess.    Sebastian Ache 09/15/2019

## 2019-09-15 NOTE — Progress Notes (Signed)
CRITICAL VALUE ALERT  Critical Value:  K+ 2.5  Date & Time Notied:  09/15/2019 0630  Provider Notified: Arsenio Loader   Orders Received/Actions taken: K+ replacement

## 2019-09-15 NOTE — Progress Notes (Signed)
NAME:  Rachel Nelson, MRN:  [id number], DOB:  18-Mar-1951, LOS: 5 ADMISSION DATE:  09/09/2019, CONSULTATION DATE: 09/10/2019 REFERRING MD: Posey Pronto, CHIEF COMPLAINT: Left flank pain  Brief History   69 y/o F admitted 12/27 with reports of left flank pain, chills and malaise.  Hx of ESBL E Coli in 2018.  Found to be in septic shock on admit with UA suggestive of UTI.  CT imaging showed left ureteral calculi with obstructing hydronephrosis, left emphysematous pyelitis. COVID negative.  The patient was initially admitted to SDU/TRH but decompensated in the ER with worsening hypotension, increased work of breathing requiring intubation & vasopressors.  Extubated 09/13/2019  Past Medical History  Diabetes ESBL E. coli in 2018  [place] [facility] Events   12/27 Admit  12/28 Decompensated with worsening hypotension, resp distress > intubated  12/31 Weaning on PSV 12/31 extubated, decompensated in the evening and not to be on BiPAP Consults:  Urology  Procedures:  Left IR Perc drain for hydro 12/28 >> L IJ TLC 12/28 >>  ETT 12/28 >>  Significant Diagnostic Tests:  CT Renal Study 12/27 >> mid left ureteral calculi are noted just beyond the gonadal veing crossing, 83mm long segment of clustered calculi measuring up to 71mm in thickness, left emphysematous pyelitis, obstructing hydronephrosis on L ECHO 12/28 >> LVEF ~ 15-40%, grade I diastolic dysfunction, moderately elevated PA systolic pressure  Micro Data:  COVID 12/27 >> negative  UC 12/27 >> E-Coli >> S- imipenem, bactrum, nitrofurantion, otherwise resistant, confirmed ESBL BCID 12/27 >> E coli and Enterobacteriae detected BCx2 12/27 >> E-Coli >>  Antimicrobials:  Meropenem 12/28 >>( day 6) Vancomycin 12/28 x1   Interim history/subjective:  More calm today Discussed with son at bedside  Objective   Blood pressure 138/71, pulse 83, temperature 98.6 F (37 C), resp. rate (!) 28, height 5\' 5"  (1.651 m), weight 66.1 kg, SpO2 99 %.   FiO2 (%):  [60 %-80 %] 60 %   Intake/Output Summary (Last 24 hours) at 09/15/2019 1149 Last data filed at 09/15/2019 1130 Gross per 24 hour  Intake 1315.07 ml  Output 6525 ml  Net -5209.93 ml   Filed Weights   09/13/19 0500 09/14/19 0425 09/15/19 0500  Weight: 74.9 kg 72.1 kg 66.1 kg    Examination: General: Does not appear to be in distress HEENT: Moist oral mucosa Neuro: Following commands, answering questions CV: S1-S2 appreciated PULM: Rhonchi at the bases GI: Protuberant, bowel sounds appreciated, nontender Extremities: Skin is warm and dry Skin: no rashes or lesions  Resolved Hospital Problem list     Assessment & Plan:   Septic shock secondary to obstructive uropathy and hydronephrosis, ESBL E. Coli -Obstructing stone in the left ureter required percutaneous drain placement 12/28 -History of ESBL E. coli in 2018 -On meropenem-plan is for 2 weeks of antibiotics  Acute hypoxic respiratory failure -Continue BiPAP as needed -X-rays as needed  Anxiety -Low-dose Ativan  Acute kidney injury Metabolic acidosis Electrolyte derangement -Being repleted -Avoid nephrotoxic's   Pancytopenia -Secondary to sepsis -Transfuse per protocol   Diabetes -Continue SSI with Lantus  Encourage oral intake  Best practice:  Diet: We will advance as tolerated Pain/Anxiety/Delirium protocol (if indicated): Ativan VAP protocol (if indicated): Not applicable DVT prophylaxis: Heparin sq, SCD's  GI prophylaxis: Pepcid Glucose control: SSI Mobility: BR, advance as tolerated  Code Status: Full code Family Communication: Discussed with son at bedside Disposition: ICU  Labs   CBC: Recent Labs  Lab 09/09/19 1639 09/11/19 0400 09/12/19 0313 09/13/19 0867  09/14/19 0411 09/15/19 0541  WBC 6.2 24.4* 16.2* 14.0* 20.2* 22.4*  NEUTROABS 5.8  --  14.6*  --   --  16.7*  HGB 9.6* 7.8* 7.1* 7.8* 7.8* 7.9*  HCT 29.5* 23.7* 20.6* 23.0* 24.4* 24.7*  MCV 87.3 89.1 84.4 86.5 87.5  89.8  PLT 200 74* 48* 42* 76* 125*    Basic Metabolic Panel: Recent Labs  Lab 09/10/19 1355 09/10/19 1846 09/11/19 0400 09/11/19 1744 09/12/19 0313 09/13/19 0342 09/13/19 1720 09/14/19 0411 09/15/19 0541  NA 135  --  137  --  139 143 147* 147* 148*  K 3.6  --  5.0  --  2.9* 2.6* 3.3* 3.6 2.5*  CL 108  --  104  --  100 102 111 104 102  CO2 14*  --  16*  --  27 30 29 30  35*  GLUCOSE 166*  --  202*  --  416* 221* 179* 244* 118*  BUN 34*  --  42*  --  68* 67* 61* 56* 48*  CREATININE 2.54*  --  2.96*  --  2.58* 1.78* 1.47* 1.50* 1.23*  CALCIUM 6.7*  --  6.5*  --  6.4* 6.4* 6.5* 6.4* 6.9*  MG 1.3* 2.6* 2.5* 2.6* 2.7*  --   --   --  1.7  PHOS 2.0* 3.9 5.1* 4.1 3.1  --   --   --   --    GFR: Estimated Creatinine Clearance: 39.4 mL/min (A) (by C-G formula based on SCr of 1.23 mg/dL (H)). Recent Labs  Lab 09/09/19 2000 09/10/19 0531 09/10/19 0536 09/10/19 1355 09/11/19 0400 09/11/19 1101 09/12/19 0313 09/13/19 0342 09/14/19 0411 09/15/19 0541  PROCALCITON  --   --   --  >150.00 >150.00  --  95.15  --   --   --   WBC  --  2.6*  --  14.8* 24.4*  --  16.2* 14.0* 20.2* 22.4*  LATICACIDVEN 1.7 4.1* 3.2*  --   --  3.9*  --   --   --   --     Liver Function Tests: Recent Labs  Lab 09/09/19 1639 09/12/19 0313  AST 19 207*  ALT 13 144*  ALKPHOS 138* 84  BILITOT 0.9 1.2  PROT 7.3 5.4*  ALBUMIN 3.4* 2.0*   No results for input(s): LIPASE, AMYLASE in the last 168 hours. No results for input(s): AMMONIA in the last 168 hours.  ABG    Component Value Date/Time   PHART 7.510 (H) 09/13/2019 2030   PCO2ART 37.9 09/13/2019 2030   PO2ART 71.1 (L) 09/13/2019 2030   HCO3 30.0 (H) 09/13/2019 2030   ACIDBASEDEF 12.2 (H) 09/10/2019 1232   O2SAT 95.6 09/13/2019 2030     Coagulation Profile: Recent Labs  Lab 09/09/19 1639  INR 1.5*    Cardiac Enzymes: No results for input(s): CKTOTAL, CKMB, CKMBINDEX, TROPONINI in the last 168 hours.  HbA1C: Hgb A1c MFr Bld  Date/Time  Value Ref Range Status  09/10/2019 05:31 AM 7.6 (H) 4.8 - 5.6 % Final    Comment:    (NOTE) Pre diabetes:          5.7%-6.4% Diabetes:              >6.4% Glycemic control for   <7.0% adults with diabetes     CBG: Recent Labs  Lab 09/14/19 2144 09/14/19 2241 09/15/19 0000 09/15/19 0359 09/15/19 0718  GLUCAP 140* 126* 134* 97 117*   The patient is critically ill with multiple organ  systems failure and requires high complexity decision making for assessment and support, frequent evaluation and titration of therapies, application of advanced monitoring technologies and extensive interpretation of multiple databases. Critical Care Time devoted to patient care services described in this note independent of APP/resident time (if applicable)  is 32 minutes.   Virl Diamond MD [facility] Personal pager: [phone] If unanswered, please page CCM On-call: #[phone]

## 2019-09-15 NOTE — Progress Notes (Signed)
Pharmacy Antibiotic Note  Rachel Nelson is a 69 y.o. female admitted on 09/09/2019 with sepsis from pyelonephritis.  Blood and urine cultures grew ESBL (+) E.Coli. Pharmacy was consulted for meropenem dosing.  D#7 of meropenem. Now afebrile, WBC elevated at 22.4. PCT starting to trend down  Plan: Increase to meropenem 2g IV q12h Follow renal function, adjust dosage as needed Plans noted per CCM for an abx course of at least 14 days  Height: 5\' 5"  (165.1 cm) Weight: 145 lb 11.6 oz (66.1 kg) IBW/kg (Calculated) : 57  Temp (24hrs), Avg:98.9 F (37.2 C), Min:98.2 F (36.8 C), Max:99.1 F (37.3 C)  Recent Labs  Lab 09/09/19 1639 09/09/19 2000 09/10/19 0531 09/10/19 0536 09/11/19 0400 09/11/19 1101 09/12/19 0313 09/13/19 0342 09/13/19 1720 09/14/19 0411 09/15/19 0541  WBC 6.2  --  2.6*  --  24.4*  --  16.2* 14.0*  --  20.2* 22.4*  CREATININE 2.14*  --  2.30*  --  2.96*  --  2.58* 1.78* 1.47* 1.50* 1.23*  LATICACIDVEN 3.1* 1.7 4.1* 3.2*  --  3.9*  --   --   --   --   --     Estimated Creatinine Clearance: 39.4 mL/min (A) (by C-G formula based on SCr of 1.23 mg/dL (H)).    No Known Allergies  Antimicrobials this admission: 12/27 meropenem >>  12/27 vancomycin >> x1  Dose adjustments this admission: --  Microbiology results: 12/27 BCID: E.Coli 12/27 BCx: E.Coli, ESBL (+), S to imipenem 12/26 UCx: <10K, insig growth 12/28 UCx: >100K E.Coli, ESBL (+), S to imipenem 12/27: SARS-CoV-2: negative  Thank you for allowing pharmacy to be a part of this patient's care.  1/28, PharmD, BCPS, BCIDP Clinical Pharmacist 09/15/2019 7:38 AM

## 2019-09-15 NOTE — Progress Notes (Signed)
eLink Physician-Brief Progress Note Patient Name: Rachel Nelson DOB: [birth year] MRN: [id number]   Date of Service  09/15/2019  HPI/Events of Note  Request for AM lab orders.  eICU Interventions  Will order: 1. CBC with platelets, BMP and Mg++ level in AM.      Intervention Category Major Interventions: Other:  Britnie Colville Dennard Nip 09/15/2019, 4:18 AM

## 2019-09-16 ENCOUNTER — Inpatient Hospital Stay (HOSPITAL_COMMUNITY): Payer: BC Managed Care – PPO

## 2019-09-16 LAB — GLUCOSE, CAPILLARY
Glucose-Capillary: 81 mg/dL (ref 70–99)
Glucose-Capillary: 86 mg/dL (ref 70–99)
Glucose-Capillary: 101 mg/dL — ABNORMAL HIGH (ref 70–99)
Glucose-Capillary: 126 mg/dL — ABNORMAL HIGH (ref 70–99)
Glucose-Capillary: 112 mg/dL — ABNORMAL HIGH (ref 70–99)

## 2019-09-16 LAB — BASIC METABOLIC PANEL
Anion gap: 9 (ref 5–15)
Anion gap: 10 (ref 5–15)
BUN: 41 mg/dL — ABNORMAL HIGH (ref 8–23)
BUN: 41 mg/dL — ABNORMAL HIGH (ref 8–23)
CO2: 37 mmol/L — ABNORMAL HIGH (ref 22–32)
CO2: 30 mmol/L (ref 22–32)
Calcium: 7.5 mg/dL — ABNORMAL LOW (ref 8.9–10.3)
Calcium: 7.7 mg/dL — ABNORMAL LOW (ref 8.9–10.3)
Chloride: 106 mmol/L (ref 98–111)
Chloride: 106 mmol/L (ref 98–111)
Creatinine, Ser: 1.2 mg/dL — ABNORMAL HIGH (ref 0.44–1.00)
Creatinine, Ser: 1.23 mg/dL — ABNORMAL HIGH (ref 0.44–1.00)
GFR calc Af Amer: 54 mL/min — ABNORMAL LOW (ref >60)
GFR calc Af Amer: 52 mL/min — ABNORMAL LOW (ref >60)
GFR calc non Af Amer: 46 mL/min — ABNORMAL LOW (ref >60)
GFR calc non Af Amer: 45 mL/min — ABNORMAL LOW (ref >60)
Glucose, Bld: 89 mg/dL (ref 70–99)
Glucose, Bld: 139 mg/dL — ABNORMAL HIGH (ref 70–99)
Potassium: 2.7 mmol/L — CL (ref 3.5–5.1)
Potassium: 3.3 mmol/L — ABNORMAL LOW (ref 3.5–5.1)
Sodium: 152 mmol/L — ABNORMAL HIGH (ref 135–145)
Sodium: 146 mmol/L — ABNORMAL HIGH (ref 135–145)

## 2019-09-16 LAB — HEPATIC FUNCTION PANEL
ALT: 77 U/L — ABNORMAL HIGH (ref 0–44)
AST: 42 U/L — ABNORMAL HIGH (ref 15–41)
Albumin: 2.2 g/dL — ABNORMAL LOW (ref 3.5–5.0)
Alkaline Phosphatase: 71 U/L (ref 38–126)
Bilirubin, Direct: 0.3 mg/dL — ABNORMAL HIGH (ref 0.0–0.2)
Indirect Bilirubin: 1 mg/dL — ABNORMAL HIGH (ref 0.3–0.9)
Total Bilirubin: 1.3 mg/dL — ABNORMAL HIGH (ref 0.3–1.2)
Total Protein: 5.9 g/dL — ABNORMAL LOW (ref 6.5–8.1)

## 2019-09-16 MED ORDER — POTASSIUM CHLORIDE CRYS ER 20 MEQ PO TBCR
40.0000 meq | EXTENDED_RELEASE_TABLET | Freq: Two times a day (BID) | ORAL | Status: AC
  Administered 2019-09-16 (×2): 40 meq via ORAL
  Filled 2019-09-16 (×2): qty 2

## 2019-09-16 MED ORDER — ACETAZOLAMIDE SODIUM 500 MG IJ SOLR
500.0000 mg | Freq: Once | INTRAMUSCULAR | Status: AC
  Administered 2019-09-16: 500 mg via INTRAVENOUS
  Filled 2019-09-16: qty 500

## 2019-09-16 MED ORDER — LIP MEDEX EX OINT
TOPICAL_OINTMENT | CUTANEOUS | Status: AC
  Filled 2019-09-16: qty 7

## 2019-09-16 MED ORDER — DEXTROSE 5 % IV BOLUS
250.0000 mL | Freq: Once | INTRAVENOUS | Status: AC
  Administered 2019-09-16: 250 mL via INTRAVENOUS

## 2019-09-16 MED ORDER — POTASSIUM CHLORIDE 10 MEQ/50ML IV SOLN
10.0000 meq | INTRAVENOUS | Status: AC
  Administered 2019-09-16 (×8): 10 meq via INTRAVENOUS
  Filled 2019-09-16 (×7): qty 50

## 2019-09-16 MED ORDER — DEXMEDETOMIDINE HCL IN NACL 400 MCG/100ML IV SOLN
0.2000 ug/kg/h | INTRAVENOUS | Status: DC
  Administered 2019-09-16 – 2019-09-17 (×2): 0.2 ug/kg/h via INTRAVENOUS
  Filled 2019-09-16 (×2): qty 100

## 2019-09-16 NOTE — Progress Notes (Signed)
[facility] ADULT ICU REPLACEMENT PROTOCOL FOR AM LAB REPLACEMENT ONLY  The patient does apply for the [facility] Adult ICU Electrolyte Replacment Protocol based on the criteria listed below:   1. Is GFR >/= 40 ml/min? Yes.    Patient's GFR today is 46 2. Is urine output >/= 0.5 ml/kg/hr for the last 6 hours? Yes.   Patient's UOP is 1.7 ml/kg/hr 3. Is BUN < 60 mg/dL? Yes.    Patient's BUN today is 41 4. Abnormal electrolyte(s):k 2.7 5. Ordered repletion with: protocol 6. If a panic level lab has been reported, has the CCM MD in charge been notified? No..   Physician:    Markus Daft A 09/16/2019 6:07 AM

## 2019-09-16 NOTE — Progress Notes (Signed)
Patient ID: Rachel Nelson, female   DOB: 20-Mar-1951, 69 y.o.   MRN: [id number]    Subjective: 1 - Left Distal Ureteral Stone - fusiform 31mmx7mm distal stone by CT 12/27. left neph tube placed 12/28.   2 - Urosepsis / Epmphysematous Pyelitis - florid sepsis form e. Coli sens merrem, zosyn, bactrim by CT 12/27. Placed on Merrem.  Avaley is improving slowly.  She remains afebrile and continues to have good urine output.  She has continued to require the Bipap intermittently.    ROS:  ROS  Anti-infectives: Anti-infectives (From admission, onward)   Start     Dose/Rate Route Frequency Ordered Stop   09/15/19 2000  meropenem (MERREM) 2 g in sodium chloride 0.9 % 100 mL IVPB     2 g 200 mL/hr over 30 Minutes Intravenous Every 12 hours 09/15/19 0809     09/10/19 0800  meropenem (MERREM) 1 g in sodium chloride 0.9 % 100 mL IVPB  Status:  Discontinued     1 g 200 mL/hr over 30 Minutes Intravenous Every 12 hours 09/10/19 0207 09/15/19 0809   09/09/19 2000  vancomycin (VANCOCIN) IVPB 1000 mg/200 mL premix     1,000 mg 200 mL/hr over 60 Minutes Intravenous  Once 09/09/19 1935 09/09/19 2136   09/09/19 1700  meropenem (MERREM) 1 g in sodium chloride 0.9 % 100 mL IVPB     1 g 200 mL/hr over 30 Minutes Intravenous  Once 09/09/19 1654 09/09/19 1832      Current Facility-Administered Medications  Medication Dose Route Frequency Provider Last Rate Last Admin  . 0.9 %  sodium chloride infusion  250 mL Intravenous Continuous Pricilla Loveless, MD   Stopped at 09/13/19 1916  . 0.9 %  sodium chloride infusion   Intravenous PRN Charlott Holler, MD   Stopped at 09/16/19 [phone]  . 0.9 %  sodium chloride infusion   Intravenous Continuous Olalere, Adewale A, MD   Stopped at 09/14/19 1354  . acetaminophen (TYLENOL) suppository 650 mg  650 mg Rectal Q6H PRN Alberteen Sam, MD   650 mg at 09/10/19 0944  . acetaminophen (TYLENOL) tablet 650 mg  650 mg Oral Q4H PRN Rolan Lipa, MD      . albuterol  (PROVENTIL) (2.5 MG/3ML) 0.083% nebulizer solution 2.5 mg  2.5 mg Nebulization Q2H PRN Rolan Lipa, MD   2.5 mg at 09/13/19 1853  . atorvastatin (LIPITOR) tablet 20 mg  20 mg Oral q1800 Charlsie Quest, MD   20 mg at 09/13/19 1724  . bisacodyl (DULCOLAX) suppository 10 mg  10 mg Rectal Daily PRN Ollis, Brandi L, NP      . chlorhexidine (PERIDEX) 0.12 % solution 15 mL  15 mL Mouth Rinse BID Charlott Holler, MD   15 mL at 09/15/19 2117  . Chlorhexidine Gluconate Cloth 2 % PADS 6 each  6 each Topical Daily Lorin Glass, MD   6 each at 09/16/19 [phone]  . dexmedetomidine (PRECEDEX) 400 MCG/100ML (4 mcg/mL) infusion  0.2-0.5 mcg/kg/hr Intravenous Titrated Karl Ito, MD 1.65 mL/hr at 09/16/19 0943 0.1 mcg/kg/hr at 09/16/19 0943  . dextrose 5 %-0.45 % sodium chloride infusion   Intravenous Continuous Virl Diamond A, MD   Stopped at 09/14/19 2315  . dextrose 50 % solution 0-50 mL  0-50 mL Intravenous PRN Olalere, Adewale A, MD      . docusate (COLACE) 50 MG/5ML liquid 100 mg  100 mg Per Tube BID PRN Rolan Lipa, MD      .  famotidine (PEPCID) IVPB 20 mg premix  20 mg Intravenous Q24H Rolan Lipa, MD 100 mL/hr at 09/16/19 1001 20 mg at 09/16/19 1001  . furosemide (LASIX) injection 40 mg  40 mg Intravenous Q12H Charlott Holler, MD   40 mg at 09/16/19 0521  . heparin injection 5,000 Units  5,000 Units Subcutaneous Q8H Ollis, Brandi L, NP   5,000 Units at 09/16/19 0521  . insulin aspart (novoLOG) injection 3-9 Units  3-9 Units Subcutaneous Q4H Olalere, Adewale A, MD   3 Units at 09/15/19 2043  . insulin glargine (LANTUS) injection 10 Units  10 Units Subcutaneous BID Olalere, Adewale A, MD   10 Units at 09/16/19 1000  . insulin regular, human (MYXREDLIN) 100 units/ 100 mL infusion   Intravenous Continuous Olalere, Adewale A, MD   Stopped at 09/14/19 2315  . LORazepam (ATIVAN) injection 0.5 mg  0.5 mg Intravenous BID PRN Olalere, Adewale A, MD   0.5 mg at 09/16/19 0046  . MEDLINE mouth rinse  15 mL  Mouth Rinse q12n4p Charlott Holler, MD   15 mL at 09/15/19 1639  . meropenem (MERREM) 2 g in sodium chloride 0.9 % 100 mL IVPB  2 g Intravenous Q12H Armandina Stammer, [facility]   Stopped at 09/16/19 1829  . ondansetron (ZOFRAN) injection 4 mg  4 mg Intravenous Q6H PRN Rolan Lipa, MD      . ondansetron [facility]) tablet 4 mg  4 mg Oral Q6H PRN Darreld Mclean R, MD      . potassium chloride 10 mEq in 50 mL *CENTRAL LINE* IVPB  10 mEq Intravenous Q1H Karl Ito, MD 50 mL/hr at 09/16/19 0943 Rate Verify at 09/16/19 0943  . sodium chloride flush (NS) 0.9 % injection 10-40 mL  10-40 mL Intracatheter Q12H Lorin Glass, MD   10 mL at 09/15/19 2118  . sodium chloride flush (NS) 0.9 % injection 10-40 mL  10-40 mL Intracatheter PRN Lorin Glass, MD         Objective: Vital signs in last 24 hours: Temp:  [98.1 F (36.7 C)-99.5 F (37.5 C)] 98.1 F (36.7 C) (01/03 0900) Pulse Rate:  [59-84] 59 (01/03 0900) Resp:  [14-29] 15 (01/03 0900) BP: (89-166)/(43-88) 97/46 (01/03 0900) SpO2:  [93 %-100 %] 100 % (01/03 0900) FiO2 (%):  [60 %] 60 % (01/02 2020) Weight:  [58.6 kg] 58.6 kg (01/03 0500)  Intake/Output from previous day: 01/02 0701 - 01/03 0700 In: 665.4 [I.V.:25.2; IV Piggyback:635.2] Out: 6200 [Urine:6200] Intake/Output this shift: Total I/O In: 464.5 [I.V.:10.4; IV Piggyback:454.2] Out: 970 [Urine:970]   Physical Exam  Lab Results:  Recent Labs    09/14/19 0411 09/15/19 0541  WBC 20.2* 22.4*  HGB 7.8* 7.9*  HCT 24.4* 24.7*  PLT 76* 125*   BMET Recent Labs    09/15/19 0541 09/16/19 0509  NA 148* 152*  K 2.5* 2.7*  CL 102 106  CO2 35* 37*  GLUCOSE 118* 89  BUN 48* 41*  CREATININE 1.23* 1.20*  CALCIUM 6.9* 7.5*   PT/INR No results for input(s): LABPROT, INR in the last 72 hours. ABG Recent Labs    09/13/19 2030  PHART 7.510*  HCO3 30.0*    Studies/Results: DG CHEST PORT 1 VIEW  Result Date: 09/15/2019 CLINICAL DATA:  Shortness of breath EXAM:  PORTABLE CHEST 1 VIEW COMPARISON:  09/13/2019 FINDINGS: Cardiac shadow is stable. Left jugular central line is again seen. Increased perihilar densities and airspace opacities throughout both lungs have increased somewhat in  the interval from the prior exam particularly in the left lung. These changes likely represent progressive congestive failure with edema. No sizable effusion is noted. No bony abnormality is seen. IMPRESSION: Progressive increased density within the lungs particularly on the left consistent with progressive edema. Electronically Signed   By: Inez Catalina M.D.   On: 09/15/2019 12:27     Assessment and Plan: She is gradually improving with antibiotics and left NT drainage.   No new recommendations.       LOS: 6 days    Irine Seal 09/16/2019 [phone]

## 2019-09-16 NOTE — Progress Notes (Signed)
Pt. Is lying in bed on [place] with no distress noted.  Vitals within normal limits.  Bipap not indicated at this time.  Will continue to monitor for need of Bipap

## 2019-09-16 NOTE — Progress Notes (Signed)
Updated son at bedside  Updated her other son over the phone

## 2019-09-16 NOTE — Progress Notes (Signed)
NAME:  Rachel Nelson, MRN:  [id number], DOB:  December 27, 1950, LOS: 6 ADMISSION DATE:  09/09/2019, CONSULTATION DATE: 09/10/2019 REFERRING MD: Allena Katz, CHIEF COMPLAINT: Left flank pain  Brief History   69 y/o F admitted 12/27 with reports of left flank pain, chills and malaise.  Hx of ESBL E Coli in 2018.  Found to be in septic shock on admit with UA suggestive of UTI.  CT imaging showed left ureteral calculi with obstructing hydronephrosis, left emphysematous pyelitis. COVID negative.  The patient was initially admitted to SDU/TRH but decompensated in the ER with worsening hypotension, increased work of breathing requiring intubation & vasopressors.  Extubated 09/13/2019  Past Medical History  Diabetes ESBL E. coli in 2018  Significant Hospital Events   12/27 Admit  12/28 Decompensated with worsening hypotension, resp distress > intubated  12/31 Weaning on PSV 12/31 extubated, decompensated in the evening and not to be on BiPAP 1/3 still requiring BiPAP, placed on Precedex last evening  Consults:  Urology  Procedures:  Left IR Perc drain for hydro 12/28 >> L IJ TLC 12/28 >>  ETT 12/28 -1/31  Significant Diagnostic Tests:  CT Renal Study 12/27 >> mid left ureteral calculi are noted just beyond the gonadal veing crossing, 65mm long segment of clustered calculi measuring up to 47mm in thickness, left emphysematous pyelitis, obstructing hydronephrosis on L ECHO 12/28 >> LVEF ~ 60-65%, grade I diastolic dysfunction, moderately elevated PA systolic pressure  Micro Data:  COVID 12/27 >> negative  UC 12/27 >> E-Coli >> S- imipenem, bactrum, nitrofurantion, otherwise resistant, confirmed ESBL BCID 12/27 >> E coli and Enterobacteriae detected BCx2 12/27 >> E-Coli >>  Antimicrobials:  Meropenem 12/28 >>( day 7) Vancomycin 12/28 x1   Interim history/subjective:  Agitated last evening Placed on Precedex  Objective   Blood pressure (!) 90/43, pulse 61, temperature 98.1 F (36.7 C), resp.  rate 14, height 5\' 5"  (1.651 m), weight 58.6 kg, SpO2 100 %.    FiO2 (%):  [60 %] 60 %   Intake/Output Summary (Last 24 hours) at 09/16/2019 0817 Last data filed at 09/16/2019 0808 Gross per 24 hour  Intake 732.4 ml  Output 6820 ml  Net -6087.6 ml   Filed Weights   09/14/19 0425 09/15/19 0500 09/16/19 0500  Weight: 72.1 kg 66.1 kg 58.6 kg    Examination: General: Not in distress, on BiPAP HEENT: BiPAP in place Neuro: Following commands, CV: S1-S2 appreciated PULM: Decreased air entry at the bases GI: Protuberant, bowel sounds appreciated, nontender Extremities: Warm and dry Skin: no rashes or lesions  Resolved Hospital Problem list     Assessment & Plan:   Septic shock secondary to obstructive uropathy and hydronephrosis, ESBL E. Coli -Obstructing stone in the left ureter require percutaneous drain placement 12/28 -History of ESBL E. coli in 2018 -On meropenem, plan is for 2 weeks of antibiotics  Acute hypoxemic respiratory failure -Continue BiPAP as needed -Repeat chest x-ray today  Anxiety/agitation -Placed on Precedex -Was on Ativan low-dose as needed  Acute kidney injury Metabolic acidosis Electrolyte derangement -Potassium being replaced -BUN/creatinine improving -Avoid nephrotoxic's  Pancytopenia -Secondary to sepsis -Resolving  Diabetes -SSI with Lantus  Encourage oral intake as tolerated Take of BiPAP as tolerated  Repeat LFT today   Best practice:  Diet: Advance diet as tolerated Pain/Anxiety/Delirium protocol (if indicated): Ativan//Precedex  VAP protocol (if indicated): Not applicable DVT prophylaxis: Heparin sq, SCD's  GI prophylaxis: Pepcid Glucose control: SSI Mobility: BR, advance as tolerated  Code Status: Full code Family Communication:  We will update Disposition: ICU  Labs   CBC: Recent Labs  Lab 09/09/19 1639 09/11/19 0400 09/12/19 0313 09/13/19 0342 09/14/19 0411 09/15/19 0541  WBC 6.2 24.4* 16.2* 14.0* 20.2* 22.4*    NEUTROABS 5.8  --  14.6*  --   --  16.7*  HGB 9.6* 7.8* 7.1* 7.8* 7.8* 7.9*  HCT 29.5* 23.7* 20.6* 23.0* 24.4* 24.7*  MCV 87.3 89.1 84.4 86.5 87.5 89.8  PLT 200 74* 48* 42* 76* 125*    Basic Metabolic Panel: Recent Labs  Lab 09/10/19 1355 09/10/19 1846 09/11/19 0400 09/11/19 1744 09/12/19 0313 09/13/19 0342 09/13/19 1720 09/14/19 0411 09/15/19 0541 09/16/19 0509  NA 135  --  137  --  139 143 147* 147* 148* 152*  K 3.6  --  5.0  --  2.9* 2.6* 3.3* 3.6 2.5* 2.7*  CL 108  --  104  --  100 102 111 104 102 106  CO2 14*  --  16*  --  27 30 29 30  35* 37*  GLUCOSE 166*  --  202*  --  416* 221* 179* 244* 118* 89  BUN 34*  --  42*  --  68* 67* 61* 56* 48* 41*  CREATININE 2.54*  --  2.96*  --  2.58* 1.78* 1.47* 1.50* 1.23* 1.20*  CALCIUM 6.7*  --  6.5*  --  6.4* 6.4* 6.5* 6.4* 6.9* 7.5*  MG 1.3* 2.6* 2.5* 2.6* 2.7*  --   --   --  1.7  --   PHOS 2.0* 3.9 5.1* 4.1 3.1  --   --   --   --   --    GFR: Estimated Creatinine Clearance: 40.4 mL/min (A) (by C-G formula based on SCr of 1.2 mg/dL (H)). Recent Labs  Lab 09/09/19 2000 09/10/19 0531 09/10/19 0536 09/10/19 1355 09/11/19 0400 09/11/19 1101 09/12/19 0313 09/13/19 0342 09/14/19 0411 09/15/19 0541  PROCALCITON  --   --   --  >150.00 >150.00  --  95.15  --   --   --   WBC  --  2.6*  --  14.8* 24.4*  --  16.2* 14.0* 20.2* 22.4*  LATICACIDVEN 1.7 4.1* 3.2*  --   --  3.9*  --   --   --   --     Liver Function Tests: Recent Labs  Lab 09/09/19 1639 09/12/19 0313  AST 19 207*  ALT 13 144*  ALKPHOS 138* 84  BILITOT 0.9 1.2  PROT 7.3 5.4*  ALBUMIN 3.4* 2.0*   No results for input(s): LIPASE, AMYLASE in the last 168 hours. No results for input(s): AMMONIA in the last 168 hours.  ABG    Component Value Date/Time   PHART 7.510 (H) 09/13/2019 2030   PCO2ART 37.9 09/13/2019 2030   PO2ART 71.1 (L) 09/13/2019 2030   HCO3 30.0 (H) 09/13/2019 2030   ACIDBASEDEF 12.2 (H) 09/10/2019 1232   O2SAT 95.6 09/13/2019 2030      Coagulation Profile: Recent Labs  Lab 09/09/19 1639  INR 1.5*    Cardiac Enzymes: No results for input(s): CKTOTAL, CKMB, CKMBINDEX, TROPONINI in the last 168 hours.  HbA1C: Hgb A1c MFr Bld  Date/Time Value Ref Range Status  09/10/2019 05:31 AM 7.6 (H) 4.8 - 5.6 % Final    Comment:    (NOTE) Pre diabetes:          5.7%-6.4% Diabetes:              >6.4% Glycemic control for   <7.0%  adults with diabetes     CBG: Recent Labs  Lab 09/15/19 1526 09/15/19 1925 09/15/19 2342 09/16/19 0309 09/16/19 0742  GLUCAP 164* 129* 107* 81 86   The patient is critically ill with multiple organ systems failure and requires high complexity decision making for assessment and support, frequent evaluation and titration of therapies, application of advanced monitoring technologies and extensive interpretation of multiple databases. Critical Care Time devoted to patient care services described in this note independent of APP/resident time (if applicable)  is 33 minutes.   Sherrilyn Rist MD [facility] Personal pager: [phone] If unanswered, please page CCM On-call: [phone]

## 2019-09-16 NOTE — Progress Notes (Signed)
CRITICAL VALUE ALERT  Critical Value:  Potassium 2.7   Date & Time Notied:  09/16/2019 0605   Provider Notified: [facility]-Link   Orders Received/Actions taken: see new orders.

## 2019-09-16 NOTE — Progress Notes (Signed)
PT is not utilizing BiPAP at this time. PT does not appear to be in respiratory distress at this time.

## 2019-09-16 NOTE — Progress Notes (Signed)
eLink Physician-Brief Progress Note Patient Name: Rachel Nelson DOB: [birth year] MRN: [id number]   Date of Service  09/16/2019  HPI/Events of Note  Agitation - Patient on BiPAP. Trying to get out of bed.   eICU Interventions  Will order: 1. Low dose Precedex IV infusion. Titrate to RASS = 0.      Intervention Category Major Interventions: Delirium, psychosis, severe agitation - evaluation and management  Rachel Nelson 09/16/2019, 4:46 AM

## 2019-09-17 ENCOUNTER — Inpatient Hospital Stay (HOSPITAL_COMMUNITY): Payer: BC Managed Care – PPO

## 2019-09-17 LAB — BASIC METABOLIC PANEL
Anion gap: 9 (ref 5–15)
BUN: 39 mg/dL — ABNORMAL HIGH (ref 8–23)
CO2: 28 mmol/L (ref 22–32)
Calcium: 7.4 mg/dL — ABNORMAL LOW (ref 8.9–10.3)
Chloride: 105 mmol/L (ref 98–111)
Creatinine, Ser: 1.11 mg/dL — ABNORMAL HIGH (ref 0.44–1.00)
GFR calc Af Amer: 59 mL/min — ABNORMAL LOW (ref >60)
GFR calc non Af Amer: 51 mL/min — ABNORMAL LOW (ref >60)
Glucose, Bld: 97 mg/dL (ref 70–99)
Potassium: 3.3 mmol/L — ABNORMAL LOW (ref 3.5–5.1)
Sodium: 142 mmol/L (ref 135–145)

## 2019-09-17 LAB — CBC WITH DIFFERENTIAL/PLATELET
Abs Immature Granulocytes: 1.17 10*3/uL — ABNORMAL HIGH (ref 0.00–0.07)
Basophils Absolute: 0.1 10*3/uL (ref 0.0–0.1)
Basophils Relative: 0 %
Eosinophils Absolute: 0.7 10*3/uL — ABNORMAL HIGH (ref 0.0–0.5)
Eosinophils Relative: 4 %
HCT: 24.2 % — ABNORMAL LOW (ref 36.0–46.0)
Hemoglobin: 7.4 g/dL — ABNORMAL LOW (ref 12.0–15.0)
Immature Granulocytes: 6 %
Lymphocytes Relative: 18 %
Lymphs Abs: 3.3 10*3/uL (ref 0.7–4.0)
MCH: 28.6 pg (ref 26.0–34.0)
MCHC: 30.6 g/dL (ref 30.0–36.0)
MCV: 93.4 fL (ref 80.0–100.0)
Monocytes Absolute: 0.6 10*3/uL (ref 0.1–1.0)
Monocytes Relative: 3 %
Neutro Abs: 13.1 10*3/uL — ABNORMAL HIGH (ref 1.7–7.7)
Neutrophils Relative %: 69 %
Platelets: 188 10*3/uL (ref 150–400)
RBC: 2.59 MIL/uL — ABNORMAL LOW (ref 3.87–5.11)
RDW: 15.2 % (ref 11.5–15.5)
WBC: 19 10*3/uL — ABNORMAL HIGH (ref 4.0–10.5)
nRBC: 0.1 % (ref 0.0–0.2)

## 2019-09-17 LAB — GLUCOSE, CAPILLARY
Glucose-Capillary: 149 mg/dL — ABNORMAL HIGH (ref 70–99)
Glucose-Capillary: 75 mg/dL (ref 70–99)
Glucose-Capillary: 150 mg/dL — ABNORMAL HIGH (ref 70–99)
Glucose-Capillary: 179 mg/dL — ABNORMAL HIGH (ref 70–99)
Glucose-Capillary: 170 mg/dL — ABNORMAL HIGH (ref 70–99)
Glucose-Capillary: 81 mg/dL (ref 70–99)
Glucose-Capillary: 210 mg/dL — ABNORMAL HIGH (ref 70–99)

## 2019-09-17 MED ORDER — INSULIN GLARGINE 100 UNIT/ML ~~LOC~~ SOLN
10.0000 [IU] | Freq: Every day | SUBCUTANEOUS | Status: DC
  Administered 2019-09-17 – 2019-09-18 (×2): 10 [IU] via SUBCUTANEOUS
  Filled 2019-09-17 (×4): qty 0.1

## 2019-09-17 MED ORDER — POTASSIUM CHLORIDE 10 MEQ/50ML IV SOLN
10.0000 meq | INTRAVENOUS | Status: AC
  Administered 2019-09-17 (×2): 10 meq via INTRAVENOUS

## 2019-09-17 NOTE — Progress Notes (Signed)
Physical Therapy Treatment Patient Details Name: Rachel Nelson MRN: [id number] DOB: [birth year] Today's Date: 09/17/2019    History of Present Illness Pt is a 69 year old woman admitted on 09/10/19 with septic shock due to UTI, obstructing hydronephrosis and L pyelitis.  Increased WOB and hypotension requring intubation from 12/28 to 12/31. PMH: ESBL Ecoli in 2018, DM.     PT Comments    Pt sliding out of recliner with leg rest elevated on arrival so assisted pt safely back into chair.  Son not present initially so utilized ipad interpreter.  Pt not agreeable to any tasks and just wanted to go home.  Attempted to explain pt not medically released and son not available however pt repeating she wanted to go home.  Pt repositioned in chair and son arrived.  Pt then agreeable to ambulate and assist back to bed with son interpreting.  Pt with improved mobility today however still requires assist and has multiples lines/leads and drain.   Follow Up Recommendations  CIR     Equipment Recommendations  None recommended by PT    Recommendations for Other Services       Precautions / Restrictions Precautions Precautions: Fall Precaution Comments: L nephrostomy    Mobility  Bed Mobility Overal bed mobility: Needs Assistance Bed Mobility: Sit to Supine       Sit to supine: Min assist   General bed mobility comments: slight assist for LEs, assist for lines  Transfers Overall transfer level: Needs assistance Equipment used: Rolling walker (2 wheeled) Transfers: Sit to/from Stand Sit to Stand: Min assist;Min guard;+2 safety/equipment         General transfer comment: pt performed 4 sit stands from stedy and doing well, so transitioned to RW so pt could ambulate  Ambulation/Gait Ambulation/Gait assistance: Min assist;+2 safety/equipment Gait Distance (Feet): 60 Feet Assistive device: Rolling walker (2 wheeled) Gait Pattern/deviations: Step-through pattern;Decreased stride length      General Gait Details: cues for remaining inside RW, no increased work of breathing, no symptoms reported to son; SPO2 remained in 90s on room air   Stairs             Wheelchair Mobility    Modified Rankin (Stroke Patients Only)       Balance           Standing balance support: Bilateral upper extremity supported Standing balance-Leahy Scale: Poor Standing balance comment: UE support                            Cognition Arousal/Alertness: Awake/alert   Overall Cognitive Status: Difficult to assess                                 General Comments: pt not agreeable and reports she is going home, send her home with attempts to communicate using ipad interpreter however son arrived later and pt agreeable to ambulate and assist back to bed      Exercises      General Comments        Pertinent Vitals/Pain Pain Assessment: Faces Faces Pain Scale: No hurt Pain Intervention(s): Repositioned;Monitored during session    Home Living                      Prior Function            PT Goals (current goals can now be  found in the care plan section) Progress towards PT goals: Progressing toward goals    Frequency    Min 3X/week      PT Plan Current plan remains appropriate    Co-evaluation              AM-PAC PT "6 Clicks" Mobility   Outcome Measure  Help needed turning from your back to your side while in a flat bed without using bedrails?: A Little Help needed moving from lying on your back to sitting on the side of a flat bed without using bedrails?: A Little Help needed moving to and from a bed to a chair (including a wheelchair)?: A Little Help needed standing up from a chair using your arms (e.g., wheelchair or bedside chair)?: A Little Help needed to walk in hospital room?: A Lot Help needed climbing 3-5 steps with a railing? : A Lot 6 Click Score: 16    End of Session   Activity Tolerance: Patient  tolerated treatment well Patient left: with call bell/phone within reach;with bed alarm set;with family/visitor present;in bed Nurse Communication: Mobility status PT Visit Diagnosis: Other abnormalities of gait and mobility (R26.89)     Time: 6468-0321 PT Time Calculation (min) (ACUTE ONLY): 34 min  Charges:  $Gait Training: 8-22 mins $Therapeutic Activity: 8-22 mins                     Arlyce Dice, DPT Acute Rehabilitation Services Office: [place] 09/17/2019, 4:41 PM

## 2019-09-17 NOTE — Progress Notes (Signed)
NAME:  Rachel Nelson, MRN:  [id number], DOB:  02-21-1951, LOS: 7 ADMISSION DATE:  09/09/2019, CONSULTATION DATE: 09/10/2019 REFERRING MD: Allena Katz, CHIEF COMPLAINT: Left flank pain  Brief History   69 y/o F admitted 12/27 with reports of left flank pain, chills and malaise.  Hx of ESBL E Coli in 2018.  Found to be in septic shock on admit with UA suggestive of UTI.  CT imaging showed left ureteral calculi with obstructing hydronephrosis, left emphysematous pyelitis. COVID negative.  The patient was initially admitted to SDU/TRH but decompensated in the ER with worsening hypotension, increased work of breathing requiring intubation & vasopressors.  Extubated 09/13/2019  Past Medical History  Diabetes ESBL E. coli in 2018  Significant Hospital Events   12/27 Admit  12/28 Decompensated with worsening hypotension, resp distress > intubated  12/31 Weaning on PSV 12/31 extubated, decompensated in the evening and not to be on BiPAP 1/3 still requiring BiPAP, placed on Precedex last evening  Consults:  Urology  Procedures:  Left IR Perc drain for hydro 12/28 >> L IJ TLC 12/28 >>  ETT 12/28 -1/31  Significant Diagnostic Tests:  CT Renal Study 12/27 >> mid left ureteral calculi are noted just beyond the gonadal veing crossing, 39mm long segment of clustered calculi measuring up to 61mm in thickness, left emphysematous pyelitis, obstructing hydronephrosis on L ECHO 12/28 >> LVEF ~ 60-65%, grade I diastolic dysfunction, moderately elevated PA systolic pressure  Micro Data:  COVID 12/27 >> negative  UC 12/27 >> E-Coli >> S- imipenem, bactrum, nitrofurantion, otherwise resistant, confirmed ESBL BCID 12/27 >> E coli and Enterobacteriae detected BCx2 12/27 >> E-Coli >>  Antimicrobials:  Meropenem 12/28 >>( day 8) Vancomycin 12/28 x1   Interim history/subjective:  Calm, very interactive Appears much better today  Objective   Blood pressure 103/66, pulse 68, temperature 97.7 F (36.5 C),  resp. rate (!) 24, height 5\' 5"  (1.651 m), weight 58.8 kg, SpO2 100 %.        Intake/Output Summary (Last 24 hours) at 09/17/2019 0950 Last data filed at 09/17/2019 0600 Gross per 24 hour  Intake 2112.61 ml  Output 4150 ml  Net -2037.39 ml   Filed Weights   09/15/19 0500 09/16/19 0500 09/17/19 0500  Weight: 66.1 kg 58.6 kg 58.8 kg    Examination: General: Awake and alert, on oxygen supplementation HEENT: On oxygen segmentation, dry oral mucosa Neuro: Following commands, no focality CV: S1-S2 appreciated PULM: Decreased at the bases GI: Protuberant, bowel sounds appreciated, nontender Extremities: Warm and dry Skin: no rashes or lesions  Resolved Hospital Problem list    Hypoxemic respiratory failure  Assessment & Plan:   Septic shock secondary to obstructive uropathy and hydronephrosis, ESBL E. Coli -Obstructing stone in the left ureter require percutaneous drain placement 12/28 -History of ESBL E. coli in 2018 -On meropenem, plan is for 2 weeks of antibiotics-day 8 today  Acute hypoxemic respiratory failure -Wean off oxygen supplementation  Anxiety/agitation -Discontinue Precedex -Appears much better today  Acute kidney injury Metabolic acidosis Electrolyte derangement -Potassium being replaced-3.3 today -BUN/creatinine improving-continuing to improve -Avoid nephrotoxic's  Pancytopenia -Secondary to sepsis -Resolving -Leukocytosis is improving  Diabetes -SSI with Lantus -Sugars are better  Encourage oral intake as tolerated  We will discontinue BiPAP  Liver function test improved  Best practice:  Diet: Continue diet as tolerated Pain/Anxiety/Delirium protocol (if indicated): Discontinue Precedex VAP protocol (if indicated): Not applicable DVT prophylaxis: Heparin sq, SCD's  GI prophylaxis: Pepcid Glucose control: SSI Mobility: BR, advance as tolerated  Code Status: Full code Family Communication: We will update Disposition: ICU  Labs    CBC: Recent Labs  Lab 09/12/19 0313 09/13/19 0342 09/14/19 0411 09/15/19 0541 09/17/19 0546  WBC 16.2* 14.0* 20.2* 22.4* 19.0*  NEUTROABS 14.6*  --   --  16.7* 13.1*  HGB 7.1* 7.8* 7.8* 7.9* 7.4*  HCT 20.6* 23.0* 24.4* 24.7* 24.2*  MCV 84.4 86.5 87.5 89.8 93.4  PLT 48* 42* 76* 125* 093    Basic Metabolic Panel: Recent Labs  Lab 09/10/19 1355 09/10/19 1846 09/11/19 0400 09/11/19 1744 09/12/19 0313 09/14/19 0411 09/15/19 0541 09/16/19 0509 09/16/19 2029 09/17/19 0546  NA 135  --  137  --  139 147* 148* 152* 146* 142  K 3.6  --  5.0  --  2.9* 3.6 2.5* 2.7* 3.3* 3.3*  CL 108  --  104  --  100 104 102 106 106 105  CO2 14*  --  16*  --  27 30 35* 37* 30 28  GLUCOSE 166*  --  202*  --  416* 244* 118* 89 139* 97  BUN 34*  --  42*  --  68* 56* 48* 41* 41* 39*  CREATININE 2.54*  --  2.96*  --  2.58* 1.50* 1.23* 1.20* 1.23* 1.11*  CALCIUM 6.7*  --  6.5*  --  6.4* 6.4* 6.9* 7.5* 7.7* 7.4*  MG 1.3* 2.6* 2.5* 2.6* 2.7*  --  1.7  --   --   --   PHOS 2.0* 3.9 5.1* 4.1 3.1  --   --   --   --   --    GFR: Estimated Creatinine Clearance: 43.6 mL/min (A) (by C-G formula based on SCr of 1.11 mg/dL (H)). Recent Labs  Lab 09/10/19 1355 09/11/19 0400 09/11/19 1101 09/12/19 0313 09/13/19 0342 09/14/19 0411 09/15/19 0541 09/17/19 0546  PROCALCITON >150.00 >150.00  --  95.15  --   --   --   --   WBC 14.8* 24.4*  --  16.2* 14.0* 20.2* 22.4* 19.0*  LATICACIDVEN  --   --  3.9*  --   --   --   --   --     Liver Function Tests: Recent Labs  Lab 09/12/19 0313 09/16/19 0900  AST 207* 42*  ALT 144* 77*  ALKPHOS 84 71  BILITOT 1.2 1.3*  PROT 5.4* 5.9*  ALBUMIN 2.0* 2.2*   No results for input(s): LIPASE, AMYLASE in the last 168 hours. No results for input(s): AMMONIA in the last 168 hours.  ABG    Component Value Date/Time   PHART 7.510 (H) 09/13/2019 2030   PCO2ART 37.9 09/13/2019 2030   PO2ART 71.1 (L) 09/13/2019 2030   HCO3 30.0 (H) 09/13/2019 2030   ACIDBASEDEF 12.2  (H) 09/10/2019 1232   O2SAT 95.6 09/13/2019 2030     Coagulation Profile: No results for input(s): INR, PROTIME in the last 168 hours.  Cardiac Enzymes: No results for input(s): CKTOTAL, CKMB, CKMBINDEX, TROPONINI in the last 168 hours.  HbA1C: Hgb A1c MFr Bld  Date/Time Value Ref Range Status  09/10/2019 05:31 AM 7.6 (H) 4.8 - 5.6 % Final    Comment:    (NOTE) Pre diabetes:          5.7%-6.4% Diabetes:              >6.4% Glycemic control for   <7.0% adults with diabetes     CBG: Recent Labs  Lab 09/16/19 1125 09/16/19 1635 09/16/19 1933 09/16/19 2323 09/17/19  0733  GLUCAP 149* 101* 126* 112* 150*   The patient is critically ill with multiple organ systems failure and requires high complexity decision making for assessment and support, frequent evaluation and titration of therapies, application of advanced monitoring technologies and extensive interpretation of multiple databases. Critical Care Time devoted to patient care services described in this note independent of APP/resident time (if applicable)  is 30 minutes.   Virl Diamond MD [facility] Personal pager: [phone] If unanswered, please page CCM On-call: #[phone]

## 2019-09-17 NOTE — Progress Notes (Signed)
[facility] ADULT ICU REPLACEMENT PROTOCOL FOR AM LAB REPLACEMENT ONLY  The patient does apply for the [facility] Adult ICU Electrolyte Replacment Protocol based on the criteria listed below:   1. Is GFR >/= 40 ml/min? Yes.    Patient's GFR today is 45 2. Is urine output >/= 0.5 ml/kg/hr for the last 6 hours? Yes.   Patient's UOP is 1.2 ml/kg/hr 3. Is BUN < 60 mg/dL? Yes.    Patient's BUN today is 41 4. Abnormal electrolyte(s): K 3.3 5. Ordered repletion with: protocol 6. If a panic level lab has been reported, has the CCM MD in charge been notified? No..   Physician:    Markus Daft A 09/17/2019 12:22 AM

## 2019-09-18 LAB — BASIC METABOLIC PANEL
Anion gap: 13 (ref 5–15)
BUN: 32 mg/dL — ABNORMAL HIGH (ref 8–23)
CO2: 25 mmol/L (ref 22–32)
Calcium: 8.1 mg/dL — ABNORMAL LOW (ref 8.9–10.3)
Chloride: 101 mmol/L (ref 98–111)
Creatinine, Ser: 1.21 mg/dL — ABNORMAL HIGH (ref 0.44–1.00)
GFR calc Af Amer: 53 mL/min — ABNORMAL LOW (ref >60)
GFR calc non Af Amer: 46 mL/min — ABNORMAL LOW (ref >60)
Glucose, Bld: 145 mg/dL — ABNORMAL HIGH (ref 70–99)
Potassium: 3.3 mmol/L — ABNORMAL LOW (ref 3.5–5.1)
Sodium: 139 mmol/L (ref 135–145)

## 2019-09-18 LAB — GLUCOSE, CAPILLARY
Glucose-Capillary: 91 mg/dL (ref 70–99)
Glucose-Capillary: 157 mg/dL — ABNORMAL HIGH (ref 70–99)
Glucose-Capillary: 175 mg/dL — ABNORMAL HIGH (ref 70–99)
Glucose-Capillary: 111 mg/dL — ABNORMAL HIGH (ref 70–99)
Glucose-Capillary: 284 mg/dL — ABNORMAL HIGH (ref 70–99)

## 2019-09-18 LAB — CBC
HCT: 31.2 % — ABNORMAL LOW (ref 36.0–46.0)
Hemoglobin: 9.6 g/dL — ABNORMAL LOW (ref 12.0–15.0)
MCH: 28.7 pg (ref 26.0–34.0)
MCHC: 30.8 g/dL (ref 30.0–36.0)
MCV: 93.1 fL (ref 80.0–100.0)
Platelets: 269 10*3/uL (ref 150–400)
RBC: 3.35 MIL/uL — ABNORMAL LOW (ref 3.87–5.11)
RDW: 15.2 % (ref 11.5–15.5)
WBC: 25.1 10*3/uL — ABNORMAL HIGH (ref 4.0–10.5)
nRBC: 0 % (ref 0.0–0.2)

## 2019-09-18 MED ORDER — POTASSIUM CHLORIDE CRYS ER 20 MEQ PO TBCR
40.0000 meq | EXTENDED_RELEASE_TABLET | Freq: Once | ORAL | Status: AC
  Administered 2019-09-18: 40 meq via ORAL
  Filled 2019-09-18: qty 2

## 2019-09-18 MED ORDER — SODIUM CHLORIDE 0.9 % IV SOLN
1.0000 g | Freq: Two times a day (BID) | INTRAVENOUS | Status: DC
  Administered 2019-09-18 – 2019-09-20 (×3): 1 g via INTRAVENOUS
  Filled 2019-09-18 (×6): qty 1

## 2019-09-18 NOTE — Progress Notes (Signed)
  Speech Language Pathology Treatment: Dysphagia  Patient Details Name: Rachel Nelson MRN: [id number] DOB: 01-Jan-1951 Today's Date: 09/18/2019 Time: 7846-9629 SLP Time Calculation (min) (ACUTE ONLY): 30 min  Assessment / Plan / Recommendation Clinical Impression  Pt today fully alert - voice minimally decreased in strength form SLPs subjective opionin.  Pt reports voice to be close to normal.  Today pt is on room air and appears comfortable.  She is agreeable to consume po intake with this SLP and is anxious to get solid foods.    Using Ipad for Urdu language, session conducted.  Pt states voice strength is near normal and she denies dysphagia.  Per chart and RN, pt has gone from Bipap to 7 liters oxygen to now room air and appears to be tolerating well.    Pt observed consuming graham cracker with peanut butter and water - No indications of dysphagia or aspiration.  Recommend to advance diet to regular/thin, informed pt of diet recommendations and swallow precautions using interpreter ipad. Documentation on board in pt's room (1236) indicates pt has a right throat nodule, would recommend follow up with ENT as OP.  All education completed with this pt.  Thanks for this consult.      HPI HPI: Ms Rachel Nelson, 68y/f, presented to ED for evaluation of flank pain, malaise and chills. Pt found to be septic due ti ESBL E. coli pyelonephritis. Ultimately pt required intubation. PMH of DM type 2, prior admission for pyelonephritis. No known history of dysphagia.       SLP Plan  All goals met       Recommendations  Diet recommendations: Regular;Thin liquid Medication Administration: Whole meds with liquid Compensations: Slow rate;Small sips/bites Postural Changes and/or Swallow Maneuvers: Seated upright 90 degrees;Upright 30-60 min after meal                Oral Care Recommendations: Oral care QID SLP Visit Diagnosis: Dysphagia, unspecified (R13.10) Plan: All goals met       GO                 Macario Golds 09/18/2019, 11:49 AM   Kathleen Lime, MS [place] Office [phone]

## 2019-09-18 NOTE — Progress Notes (Addendum)
ICU RN reported that sons were allowed to spend the night due to language barrier. Patient speaks Erdu, a dialect of two languages. Received permission from [employer] and [facility] for son to stay past 8 pm until about 10 pm or 11 pm when the patient goes to sleep to keep her from being so anxious.

## 2019-09-18 NOTE — Progress Notes (Signed)
NAME:  Rachel Nelson, MRN:  [id number], DOB:  [birth year], LOS: 8 ADMISSION DATE:  09/09/2019, CONSULTATION DATE: 09/10/2019 REFERRING MD: Posey Pronto, CHIEF COMPLAINT: Left flank pain  Brief History   69 y/o F admitted 12/27 with reports of left flank pain, chills and malaise.  Hx of ESBL E Coli in 2018.  Found to be in septic shock on admit with UA suggestive of UTI.  CT imaging showed left ureteral calculi with obstructing hydronephrosis, left emphysematous pyelitis. COVID negative.  The patient was initially admitted to SDU/TRH but decompensated in the ER with worsening hypotension, increased work of breathing requiring intubation & vasopressors.  Extubated 09/13/2019  Past Medical History  Diabetes ESBL E. coli in 2018  [place] [facility] Events   12/27 Admit  12/28 Decompensated with worsening hypotension, resp distress > intubated  12/31 Weaning on PSV 12/31 extubated, decompensated in the evening and not to be on BiPAP 1/3 still requiring BiPAP, placed on Precedex last evening 1/4 Off Precedex  Consults:  Urology  Procedures:  Left IR Perc drain for hydro 12/28 >> L IJ TLC 12/28 >>  ETT 12/28 -1/31  Significant Diagnostic Tests:  CT Renal Study 12/27 >> mid left ureteral calculi are noted just beyond the gonadal veing crossing, 30mm long segment of clustered calculi measuring up to 62mm in thickness, left emphysematous pyelitis, obstructing hydronephrosis on L ECHO 12/28 >> LVEF ~ 62-95%, grade I diastolic dysfunction, moderately elevated PA systolic pressure  Micro Data:  COVID 12/27 >> negative  UC 12/27 >> E-Coli >> S- imipenem, bactrum, nitrofurantion, otherwise resistant, confirmed ESBL BCID 12/27 >> E coli and Enterobacteriae detected BCx2 12/27 >> E-Coli >> S- imipenem, bactrim, Zosyn, otherwise resistant, ESBL  Antimicrobials:  Meropenem 12/28 >>( day 8) Vancomycin 12/28 x1   Interim history/subjective:  Has not used BiPAP in the last 48 hours. Off Precedex.  Currently on RA with SpO2 >95%  Objective   Blood pressure (!) 121/56, pulse 69, temperature 99 F (37.2 C), resp. rate (!) 23, height 5\' 5"  (1.651 m), weight 59 kg, SpO2 96 %.        Intake/Output Summary (Last 24 hours) at 09/18/2019 0748 Last data filed at 09/18/2019 0600 Gross per 24 hour  Intake 647.15 ml  Output 3275 ml  Net -2627.85 ml   Filed Weights   09/16/19 0500 09/17/19 0500 09/18/19 0500  Weight: 58.6 kg 58.8 kg 59 kg   Physical Exam: General: Well-appearing, no acute distress HENT: Agar, AT, OP clear, MMM Eyes: EOMI, no scleral icterus Respiratory: Clear to auscultation bilaterally.  No crackles, wheezing or rales Cardiovascular: RRR, -M/R/G, no JVD GI: BS+, soft, nontender Extremities:-Edema,-tenderness Neuro: AAO x4, CNII-XII grossly intact Skin: Intact, no rashes or bruising Psych: Normal mood, normal affect  Resolved Hospital Problem list    Hypoxemic respiratory failure Agitation requiring sedation  Assessment & Plan:   Septic shock secondary to obstructive uropathy and hydronephrosis, ESBL E. Col - shock resolved -Obstructing stone in the left ureter require percutaneous drain placement 12/28 -History of ESBL E. coli in 2018 -On meropenem, plan is for 2 weeks of antibiotics- (end date 1/9)  Acute kidney injury Metabolic acidosis Electrolyte derangement: Hypokalemia -F/u BMP. Replete electrolytes as needed -BUN/creatinine improving-continuing to improve -Avoid nephrotoxic's  Pancytopenia - resolving -Secondary to sepsis -Resolving  Diabetes - improved glucose levels -Q4 SSI with Lantus -Discontinue D5 -Encouraged PO intake  Chronic diastolic heart failure -Discontinue IV lasix -Monitor volume status and UOP  Best practice:  Diet: Continue diet as tolerated  Pain/Anxiety/Delirium protocol (if indicated): -- VAP protocol (if indicated): Not applicable DVT prophylaxis: Heparin sq, SCD's  GI prophylaxis: Pepcid Glucose control:  SSI Mobility: BR, advance as tolerated  Code Status: Full code Family Communication: Updated son, Adeel on 1/5 Disposition: ICU  Labs   CBC: Recent Labs  Lab 09/12/19 0313 09/13/19 0342 09/14/19 0411 09/15/19 0541 09/17/19 0546  WBC 16.2* 14.0* 20.2* 22.4* 19.0*  NEUTROABS 14.6*  --   --  16.7* 13.1*  HGB 7.1* 7.8* 7.8* 7.9* 7.4*  HCT 20.6* 23.0* 24.4* 24.7* 24.2*  MCV 84.4 86.5 87.5 89.8 93.4  PLT 48* 42* 76* 125* 188    Basic Metabolic Panel: Recent Labs  Lab 09/11/19 1744 09/12/19 0313 09/14/19 0411 09/15/19 0541 09/16/19 0509 09/16/19 2029 09/17/19 0546  NA  --  139 147* 148* 152* 146* 142  K  --  2.9* 3.6 2.5* 2.7* 3.3* 3.3*  CL  --  100 104 102 106 106 105  CO2  --  27 30 35* 37* 30 28  GLUCOSE  --  416* 244* 118* 89 139* 97  BUN  --  68* 56* 48* 41* 41* 39*  CREATININE  --  2.58* 1.50* 1.23* 1.20* 1.23* 1.11*  CALCIUM  --  6.4* 6.4* 6.9* 7.5* 7.7* 7.4*  MG 2.6* 2.7*  --  1.7  --   --   --   PHOS 4.1 3.1  --   --   --   --   --    GFR: Estimated Creatinine Clearance: 43.6 mL/min (A) (by C-G formula based on SCr of 1.11 mg/dL (H)). Recent Labs  Lab 09/11/19 1101 09/12/19 0313 09/13/19 0342 09/14/19 0411 09/15/19 0541 09/17/19 0546  PROCALCITON  --  95.15  --   --   --   --   WBC  --  16.2* 14.0* 20.2* 22.4* 19.0*  LATICACIDVEN 3.9*  --   --   --   --   --     Liver Function Tests: Recent Labs  Lab 09/12/19 0313 09/16/19 0900  AST 207* 42*  ALT 144* 77*  ALKPHOS 84 71  BILITOT 1.2 1.3*  PROT 5.4* 5.9*  ALBUMIN 2.0* 2.2*   No results for input(s): LIPASE, AMYLASE in the last 168 hours. No results for input(s): AMMONIA in the last 168 hours.  ABG    Component Value Date/Time   PHART 7.510 (H) 09/13/2019 2030   PCO2ART 37.9 09/13/2019 2030   PO2ART 71.1 (L) 09/13/2019 2030   HCO3 30.0 (H) 09/13/2019 2030   ACIDBASEDEF 12.2 (H) 09/10/2019 1232   O2SAT 95.6 09/13/2019 2030     Coagulation Profile: No results for input(s): INR,  PROTIME in the last 168 hours.  Cardiac Enzymes: No results for input(s): CKTOTAL, CKMB, CKMBINDEX, TROPONINI in the last 168 hours.  HbA1C: Hgb A1c MFr Bld  Date/Time Value Ref Range Status  09/10/2019 05:31 AM 7.6 (H) 4.8 - 5.6 % Final    Comment:    (NOTE) Pre diabetes:          5.7%-6.4% Diabetes:              >6.4% Glycemic control for   <7.0% adults with diabetes     CBG: Recent Labs  Lab 09/17/19 1548 09/17/19 1945 09/17/19 2329 09/18/19 0325 09/18/19 0738  GLUCAP 170* 81 210* 91 157*   Care Time: 35 min  Mechele Collin, M.D. [facility] Pulmonary/Critical Care Medicine 09/18/2019 8:06 AM

## 2019-09-18 NOTE — Progress Notes (Addendum)
Inpatient Rehabilitation Admissions Coordinator  Inpatient rehab consult received. I await OT updated treatment notes for original eval 1/1 and then will follow up with pt's son to assess rehab needs.  Ottie Glazier, RN, MSN Rehab Admissions Coordinator [phone] 09/18/2019 3:53 PM

## 2019-09-18 NOTE — Progress Notes (Signed)
Pharmacy Antibiotic Note  Rachel Nelson is a 69 y.o. female admitted on 09/09/2019 with sepsis from pyelonephritis.  Blood and urine cultures grew ESBL (+) E.Coli. Pharmacy was consulted for meropenem dosing.  D#10/14 of meropenem. No recent fevers, WBC down to 19. Clinical status improving slowly.  Plan: Change to meropenem 1g IV q12h Follow renal function, adjust dosage as needed Plan to complete 14 days  Height: 5\' 5"  (165.1 cm) Weight: 130 lb 1.1 oz (59 kg) IBW/kg (Calculated) : 57  Temp (24hrs), Avg:98.8 F (37.1 C), Min:97.9 F (36.6 C), Max:99.3 F (37.4 C)  Recent Labs  Lab 09/11/19 1101 09/12/19 0313 09/13/19 0342 09/14/19 0411 09/15/19 0541 09/16/19 0509 09/16/19 2029 09/17/19 0546  WBC  --  16.2* 14.0* 20.2* 22.4*  --   --  19.0*  CREATININE  --  2.58* 1.78* 1.50* 1.23* 1.20* 1.23* 1.11*  LATICACIDVEN 3.9*  --   --   --   --   --   --   --     Estimated Creatinine Clearance: 43.6 mL/min (A) (by C-G formula based on SCr of 1.11 mg/dL (H)).    No Known Allergies  Antimicrobials this admission: 12/27 meropenem >> 1/9 12/27 vancomycin >> x1  Dose adjustments this admission: --  Microbiology results: 12/27 BCID: E.Coli 12/27 BCx: E.Coli, ESBL (+), S to imipenem 12/26 UCx: <10K, insig growth 12/28 UCx: >100K E.Coli, ESBL (+), S to imipenem 12/27: SARS-CoV-2: negative  Thank you for allowing pharmacy to be a part of this patient's care.  1/28, PharmD, BCPS, BCIDP Clinical Pharmacist 09/18/2019 8:27 AM

## 2019-09-19 LAB — BASIC METABOLIC PANEL
Anion gap: 9 (ref 5–15)
BUN: 31 mg/dL — ABNORMAL HIGH (ref 8–23)
CO2: 23 mmol/L (ref 22–32)
Calcium: 7.7 mg/dL — ABNORMAL LOW (ref 8.9–10.3)
Chloride: 104 mmol/L (ref 98–111)
Creatinine, Ser: 1.22 mg/dL — ABNORMAL HIGH (ref 0.44–1.00)
GFR calc Af Amer: 53 mL/min — ABNORMAL LOW (ref >60)
GFR calc non Af Amer: 45 mL/min — ABNORMAL LOW (ref >60)
Glucose, Bld: 166 mg/dL — ABNORMAL HIGH (ref 70–99)
Potassium: 3.7 mmol/L (ref 3.5–5.1)
Sodium: 136 mmol/L (ref 135–145)

## 2019-09-19 LAB — CBC
HCT: 25.6 % — ABNORMAL LOW (ref 36.0–46.0)
Hemoglobin: 8.1 g/dL — ABNORMAL LOW (ref 12.0–15.0)
MCH: 28.8 pg (ref 26.0–34.0)
MCHC: 31.6 g/dL (ref 30.0–36.0)
MCV: 91.1 fL (ref 80.0–100.0)
Platelets: 265 10*3/uL (ref 150–400)
RBC: 2.81 MIL/uL — ABNORMAL LOW (ref 3.87–5.11)
RDW: 15.3 % (ref 11.5–15.5)
WBC: 22.6 10*3/uL — ABNORMAL HIGH (ref 4.0–10.5)
nRBC: 0 % (ref 0.0–0.2)

## 2019-09-19 LAB — GLUCOSE, CAPILLARY
Glucose-Capillary: 141 mg/dL — ABNORMAL HIGH (ref 70–99)
Glucose-Capillary: 155 mg/dL — ABNORMAL HIGH (ref 70–99)
Glucose-Capillary: 194 mg/dL — ABNORMAL HIGH (ref 70–99)
Glucose-Capillary: 240 mg/dL — ABNORMAL HIGH (ref 70–99)
Glucose-Capillary: 307 mg/dL — ABNORMAL HIGH (ref 70–99)

## 2019-09-19 MED ORDER — ADULT MULTIVITAMIN W/MINERALS CH
1.0000 | ORAL_TABLET | Freq: Every day | ORAL | Status: DC
  Administered 2019-09-19 – 2019-09-20 (×2): 1 via ORAL
  Filled 2019-09-19 (×2): qty 1

## 2019-09-19 MED ORDER — ENSURE ENLIVE PO LIQD
237.0000 mL | ORAL | Status: DC
  Administered 2019-09-20: 237 mL via ORAL

## 2019-09-19 MED ORDER — INSULIN ASPART 100 UNIT/ML ~~LOC~~ SOLN: 0.0000 [IU] | Freq: Every day | SUBCUTANEOUS | Status: DC

## 2019-09-19 MED ORDER — INSULIN ASPART 100 UNIT/ML ~~LOC~~ SOLN
0.0000 [IU] | Freq: Three times a day (TID) | SUBCUTANEOUS | Status: DC
  Administered 2019-09-19 – 2019-09-20 (×2): 3 [IU] via SUBCUTANEOUS

## 2019-09-19 NOTE — Progress Notes (Signed)
Occupational Therapy Treatment Patient Details Name: Rachel Nelson MRN: [id number] DOB: [birth year] Today's Date: 09/19/2019    History of present illness Pt is a 69 year old woman admitted on 09/10/19 with septic shock due to UTI, obstructing hydronephrosis and L pyelitis.  Increased WOB and hypotension requring intubation from 12/28 to 12/31. PMH: ESBL Ecoli in 2018, DM.    OT comments  Pt indicated that she needed to use the commode. She states that she does not have this at home. Pt with bil ankle pain and needed cues for safety. She did not want to walk today.  Pt did much better with RW vs without. She refused socks.  Follow Up Recommendations  Home health OT;Supervision/Assistance - 24 hour(family wants to take pt home)    Equipment Recommendations  3:1 commode    Recommendations for Other Services      Precautions / Restrictions Precautions Precautions: Fall Precaution Comments: L nephrostomy Restrictions Weight Bearing Restrictions: No       Mobility Bed Mobility               General bed mobility comments: pt was sitting EOB  Transfers   Equipment used: Rolling walker (2 wheeled)   Sit to Stand: Min assist;+2 physical assistance         General transfer comment: initially, did not use RW to get to [facility] and pt needed max +2 to stand and pivot; had ankle pain and refused socks, so feet were slipping.  Min +2 when Dan Humphreys was used.    Balance                                           ADL either performed or assessed with clinical judgement   ADL       Grooming: Wash/dry hands;Min guard;Standing                       Toileting- Clothing Manipulation and Hygiene: Set up;Sitting/lateral lean         General ADL Comments: got up to commode, SPT without RW max +2 assist to power up as feet were sliding. Pt refused socks.  Off commode, min +2 using RW.  Despite not wanting to wear socks, she is able to reach to ankles to  identify pain.  Based on clinical judgment, pt now needs min A for UB adls and mod for LB adls     Vision       Perception     Praxis      Cognition Arousal/Alertness: Awake/alert Behavior During Therapy: WFL for tasks assessed/performed Overall Cognitive Status: Difficult to assess                                 General Comments: pt follows cues in context, multimodal due to language barrier.  Decreased safety, likely due to ankle pain        Exercises     Shoulder Instructions       General Comments      Pertinent Vitals/ Pain       Pain Assessment: Faces Faces Pain Scale: Hurts even more Pain Location: Pt reporting R ankle, but flinches with palpation of metatarsal heads bil feet Pain Descriptors / Indicators: Grimacing;Guarding Pain Intervention(s): Limited activity within patient's tolerance;Monitored during session;Repositioned(notified RN)  Home Living  Prior Functioning/Environment              Frequency  Min 2X/week        Progress Toward Goals  OT Goals(current goals can now be found in the care plan section)  Progress towards OT goals: Progressing toward goals     Plan      Co-evaluation    PT/OT/SLP Co-Evaluation/Treatment: Yes Reason for Co-Treatment: Complexity of the patient's impairments (multi-system involvement) PT goals addressed during session: Mobility/safety with mobility OT goals addressed during session: ADL's and self-care      AM-PAC OT "6 Clicks" Daily Activity     Outcome Measure   Help from another person eating meals?: A Little Help from another person taking care of personal grooming?: A Little Help from another person toileting, which includes using toliet, bedpan, or urinal?: A Lot Help from another person bathing (including washing, rinsing, drying)?: A Little Help from another person to put on and taking off regular upper body  clothing?: A Little Help from another person to put on and taking off regular lower body clothing?: A Lot 6 Click Score: 16    End of Session    OT Visit Diagnosis: Unsteadiness on feet (R26.81);Muscle weakness (generalized) (M62.81);Pain Pain - Right/Left: Right Pain - part of body: Ankle and joints of foot   Activity Tolerance Patient limited by pain   Patient Left in chair;with call bell/phone within reach;with chair alarm set   Nurse Communication          Time: 5726-2035 OT Time Calculation (min): 27 min  Charges: OT General Charges $OT Visit: 1 Visit OT Treatments $Self Care/Home Management : 8-22 mins  Deaunte Dente S, OTR/L Acute Rehabilitation Services 09/19/2019   [place] 09/19/2019, 12:31 PM

## 2019-09-19 NOTE — Progress Notes (Signed)
Inpatient Rehabilitation Admissions Coordinator  I spoke to patient's son, Adeel , at bedside by phone to discuss rehab venue options and their preference. He states he is 100% sure he wants to take patient directly home with home health set up. He has observed how she has improved and feels confident that they have the family support at home to provide 24/7 care. I will alert Therapy,MD and RN CM of his preference. Son does not want to pursue CIR admit with her insurance. We will sign off at this time. Please call me with any questions.  Ottie Glazier, RN, MSN Rehab Admissions Coordinator [phone] 09/19/2019 10:08 AM

## 2019-09-19 NOTE — Progress Notes (Signed)
Patient refused BG check from tech and myself. I educated her on the matter and the lack of insulin admin d/t no sugar check. Last BG was 240, taken around 1630 on 09/18/18.

## 2019-09-19 NOTE — Progress Notes (Addendum)
Patient has orders for foley care - however, patient no longer has a foley and I am not sure when the patient's foley was removed because it is still active in her chart. Also, looking at patient's glucose trend she seems to dip low overnight - her 2000 glucose check was high, however I held insulin to see what she would do: at 12:00am she dropped 100 points.. I held the insulin for 12:00am also and will re-evaluate CBG here soon for the 4am dose.

## 2019-09-19 NOTE — Progress Notes (Signed)
Physical Therapy Treatment Patient Details Name: Rachel Nelson MRN: [id number] DOB: 1951-02-26 Today's Date: 09/19/2019    History of Present Illness Pt is a 69 year old woman admitted on 09/10/19 with septic shock due to UTI, obstructing hydronephrosis and L pyelitis.  Increased WOB and hypotension requring intubation from 12/28 to 12/31. PMH: ESBL Ecoli in 2018, DM.     PT Comments    Pt had c/o R ankle pain today with decreased ability to ambulate, but no pain symptoms with AROM or palpation of ankle.  She ambulated 6' with RW and min A of 2 for safety.  Notified RN of ankle pain and decreased mobility.  Additionally, somewhat limited today due to family not present to interpret and pt has not done well in past with virtual interpretor   Follow Up Recommendations  CIR(Continue to recommend rehab; however, per chart pt's son now planning on home with HHPT and 24 hr care)     Equipment Recommendations  Other (comment)(If going home based on mobility today w/c and bsc)    Recommendations for Other Services       Precautions / Restrictions Precautions Precautions: Fall Precaution Comments: L nephrostomy Restrictions Weight Bearing Restrictions: No    Mobility  Bed Mobility Overal bed mobility: Needs Assistance             General bed mobility comments: pt was sitting EOB  Transfers Overall transfer level: Needs assistance Equipment used: Rolling walker (2 wheeled) Transfers: Sit to/from Stand Sit to Stand: Min assist;+2 physical assistance         General transfer comment: Initially, did not use RW to get to [facility] and pt needed max +2 to stand and pivot; had ankle pain and refused socks, so feet were slipping.  Min +2 when Gilford Rile was used.; visual cues to lean forward  Ambulation/Gait Ambulation/Gait assistance: Min assist;+2 safety/equipment Gait Distance (Feet): 6 Feet Assistive device: Rolling walker (2 wheeled) Gait Pattern/deviations: Step-to pattern;Decreased  stride length     General Gait Details: cues to remain inside RW; pt with step to right gait; only able to ambulate in room for transfers due to ankle pain; decreased safety awareness -reaching to sit too early requiring tactile cue to continue with RW   Stairs             Wheelchair Mobility    Modified Rankin (Stroke Patients Only)       Balance Overall balance assessment: Needs assistance Sitting-balance support: No upper extremity supported;Feet unsupported Sitting balance-Leahy Scale: Good     Standing balance support: Bilateral upper extremity supported;During functional activity Standing balance-Leahy Scale: Poor                              Cognition Arousal/Alertness: Awake/alert Behavior During Therapy: WFL for tasks assessed/performed Overall Cognitive Status: Difficult to assess                                 General Comments: pt follows cues in context, multimodal due to language barrier; has not done well with Ipad interpretor and son not present; Decreased safety, likely due to ankle pain      Exercises      General Comments General comments (skin integrity, edema, etc.): Pt reporting R ankle pain and performing step to gait.  DIfficult to further assess due to language barrier.  Pt having pain with palpation  of distal foot and not with ankle, but continues to state/point that it hurts in ankle.  Gait was limited.  Pt c/o swelling - PT did not note any edema.  Pt demonstrated full AROM without signs of pain.      Pertinent Vitals/Pain Pain Assessment: Faces Faces Pain Scale: Hurts even more Pain Location: Pt reporting R ankle, but flinches with palpation of metatarsal heads bil feet Pain Descriptors / Indicators: Grimacing;Guarding Pain Intervention(s): Limited activity within patient's tolerance;Monitored during session;Repositioned(notified RN)    Home Living                      Prior Function             PT Goals (current goals can now be found in the care plan section) Progress towards PT goals: Progressing toward goals    Frequency    Min 3X/week      PT Plan Current plan remains appropriate    Co-evaluation   Reason for Co-Treatment: Complexity of the patient's impairments (multi-system involvement) PT goals addressed during session: Mobility/safety with mobility;Proper use of DME;Balance OT goals addressed during session: ADL's and self-care      AM-PAC PT "6 Clicks" Mobility   Outcome Measure  Help needed turning from your back to your side while in a flat bed without using bedrails?: None Help needed moving from lying on your back to sitting on the side of a flat bed without using bedrails?: None Help needed moving to and from a bed to a chair (including a wheelchair)?: A Little Help needed standing up from a chair using your arms (e.g., wheelchair or bedside chair)?: A Little Help needed to walk in hospital room?: A Lot Help needed climbing 3-5 steps with a railing? : A Lot 6 Click Score: 18    End of Session Equipment Utilized During Treatment: Gait belt Activity Tolerance: Patient limited by pain Patient left: with call bell/phone within reach;with chair alarm set;in chair Nurse Communication: Mobility status PT Visit Diagnosis: Other abnormalities of gait and mobility (R26.89)     Time: 1038-1100 PT Time Calculation (min) (ACUTE ONLY): 22 min  Charges:  $Gait Training: 8-22 mins                     Rachel Nelson, PT Acute Rehab Services Pager [phone] [facility] [phone] [facility] [phone]    Rayetta Humphrey 09/19/2019, 12:56 PM

## 2019-09-19 NOTE — Progress Notes (Signed)
Nutrition Follow-up  RD working remotely.   DOCUMENTATION CODES:   Not applicable  INTERVENTION:  - will order Ensure Enlive once/day, each supplement provides 350 kcal and 20 grams of protein. - will order Magic Cup with dinner meals, each supplement provides 290 kcal and 9 grams of protein. - will order daily multivitamin with minerals.    NUTRITION DIAGNOSIS:   Increased nutrient needs related to acute illness as evidenced by estimated needs. -ongoing  GOAL:   Patient will meet greater than or equal to 90% of their needs -unmet at this time.   MONITOR:   PO intake, Supplement acceptance, Labs, Weight trends  ASSESSMENT:   69 y.o. female with medical history significant for type 2 DM. She presented to the ED for flank pain, malaise, and chills which developed 2 days PTA. These symptoms were associated with nausea and decreased appetite.  Significant Events: 12/28- admission; intubation; perc drain placement d/t obstructing stone in L ureter 12/29- TF initiation 12/31- extubation; SLP evaluation; diet advanced from NPO to CLD 1/5- diet advanced from CLD to FLD and then to Regular   No intakes documented since diet advancement began. RD is familiar with patient from previous assessments while she was intubated. Patient speaks and understands minimal [place]. Her son usually translates for her when he is present as the dialect she speaks is not available through the interpretor services.   Weight has been stable over the past 3 days. Weight has fluctuated throughout hospitalization.  Per notes: - AKI - on abx with stop date of 1/9 - pancytopenia--resolving   Labs reviewed; CBGs: 194, 141, and 155 mg/dl, BUN: 31 mg/dl, creatinine: 2.35 mg/dl, Ca: 7.7 mg/dl, GFR: 45 ml/min. Medications reviewed; sliding scale novolog, 10 units lantus/day, 40 mEq Klor-Con x1 dose 1/5.     Diet Order:   Diet Order            Diet regular Room service appropriate? Yes; Fluid  consistency: Thin  Diet effective now              EDUCATION NEEDS:   Not appropriate for education at this time  Skin:  Skin Assessment: Reviewed RN Assessment  Last BM:  1/3  Height:   Ht Readings from Last 1 Encounters:  09/10/19 5\' 5"  (1.651 m)    Weight:   Wt Readings from Last 1 Encounters:  09/18/19 59 kg    Ideal Body Weight:  56.8 kg  BMI:  Body mass index is 21.64 kg/m.  Estimated Nutritional Needs:   Kcal:  1770-1945 kcal  Protein:  70-85 grams  Fluid:  >/= 2 L/day     11/16/19, MS, RD, LDN, [facility] Inpatient Clinical Dietitian Pager # [phone] After hours/weekend pager # [phone]

## 2019-09-19 NOTE — Progress Notes (Signed)
PROGRESS NOTE    Rachel Nelson  ZOX:[id number] DOB: 1950-12-13 DOA: 09/09/2019 PCP: Georgianne Fickamachandran, Ajith, MD   Brief Narrative:  Patient is a 69 year old female who was admitted on 12/27 with complaints of left flank pain, chills, malaise.  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  She was found to be in septic shock .She has  history of UTI.  CT imaging showed left ureteral calculi with obstructing hydronephrosis, left emphysematous pyelitis.  Covid negative.  Patient decompensated in the emergency department with worsening hypotension, increased work of breathing and she required intubation and vasopressors.  She was extubated on 09/13/2019.  Blood culture showed ESBL E. coli.  Urology consulted and she underwent left-sided percutaneous nephrostomy tube placement.  She has been transferred to hospital service on 09/19/2019.  Currently she is hemodynamically stable.  Initial plan was to discharge her to CIR but family decided to take her home with home health.  She will complete 10 days course of IV antibiotic tomorrow.  She is stable for discharge to  home tomorrow.  Assessment & Plan:   Principal Problem:   Sepsis due to urinary tract infection (HCC) Active Problems:   Diabetes mellitus type II, non insulin dependent (HCC)   Pyelonephritis   Acute kidney injury (HCC)   Septic shock (HCC)   Acute respiratory failure with hypoxia (HCC)  Septic shock/gram-negative bacteremia: Currently hemodynamically stable.  Blood culture showed ESBL E. coli.  Currently on meropenem, day 9.  Continue till tomorrow. Total course of the antibiotic should be 10 days because this is a urinary source.  Discussed with ID. She still has significant leukocytosis.  Check CBC tomorrow.  Obstructive uropathy/hydronephrosis: CT imaging showed obstructing stone in the left ureter.  Urology was consulted.  She required percutaneous drain placement on 12/28.  She will follow-up with urology as an outpatient.  AKI:  Resolved  Pancytopenia: Secondary to sepsis.  Improving.  Diabetes type 2: Continue current insulin regimen.  Chronic diastolic CHF: IV Lasix discontinued.  Currently he appears euvolemic.  Nutrition Problem: Increased nutrient needs Etiology: acute illness      DVT prophylaxis:Hep [place] Code Status: Full Family Communication: None present the bedside Disposition Plan: Likely home tomorrow   Consultants: PCCM  Procedures: Intubation,left percutaneous nephrostomy tube  Antimicrobials:  Anti-infectives (From admission, onward)   Start     Dose/Rate Route Frequency Ordered Stop   09/18/19 2000  meropenem (MERREM) 1 g in sodium chloride 0.9 % 100 mL IVPB     1 g 200 mL/hr over 30 Minutes Intravenous Every 12 hours 09/18/19 0829 09/22/19 2359   09/15/19 2000  meropenem (MERREM) 2 g in sodium chloride 0.9 % 100 mL IVPB  Status:  Discontinued     2 g 200 mL/hr over 30 Minutes Intravenous Every 12 hours 09/15/19 0809 09/18/19 0829   09/10/19 0800  meropenem (MERREM) 1 g in sodium chloride 0.9 % 100 mL IVPB  Status:  Discontinued     1 g 200 mL/hr over 30 Minutes Intravenous Every 12 hours 09/10/19 0207 09/15/19 0809   09/09/19 2000  vancomycin (VANCOCIN) IVPB 1000 mg/200 mL premix     1,000 mg 200 mL/hr over 60 Minutes Intravenous  Once 09/09/19 1935 09/09/19 2136   09/09/19 1700  meropenem (MERREM) 1 g in sodium chloride 0.9 % 100 mL IVPB     1 g 200 mL/hr over 30 Minutes Intravenous  Once 09/09/19 1654 09/09/19 1832      Subjective: Patient seen and examined  at the bedside this morning.  Hemodynamically stable.  Blood pressure little soft today.  Looks better.  Denies any complaints.  Wants some orange juice.  Denies any shortness of breath, chest pain or abdominal pain.  Left-sided nephrostomy tube draining clear urine.  Objective: Vitals:   09/18/19 1501 09/18/19 2102 09/19/19 0422 09/19/19 1156  BP: (!) 109/50 (!) 102/45 (!) 110/48 (!) 94/46  Pulse: 80 78 69 79  Resp:  18 16 20 18   Temp: 97.9 F (36.6 C) 98.4 F (36.9 C) 98.4 F (36.9 C) 98.1 F (36.7 C)  TempSrc: Oral Oral Oral Oral  SpO2: 99% 96% 95% 99%  Weight:      Height:        Intake/Output Summary (Last 24 hours) at 09/19/2019 1407 Last data filed at 09/19/2019 0602 Gross per 24 hour  Intake 300 ml  Output 450 ml  Net -150 ml   Filed Weights   09/16/19 0500 09/17/19 0500 09/18/19 0500  Weight: 58.6 kg 58.8 kg 59 kg    Examination:  General exam: Generalized weakness HEENT:PERRL,Oral mucosa moist, Ear/Nose normal on gross exam Respiratory system: Bilateral equal air entry, normal vesicular breath sounds, no wheezes or crackles  Cardiovascular system: S1 & S2 heard, RRR. No JVD, murmurs, rubs, gallops or clicks. No pedal edema. Gastrointestinal system: Abdomen is nondistended, soft and nontender. No organomegaly or masses felt. Normal bowel sounds heard.Left nephrostomy tube Central nervous system: Alert and oriented. No focal neurological deficits. Extremities: No edema, no clubbing ,no cyanosis, distal peripheral pulses palpable. Skin: No rashes, lesions or ulcers,no icterus ,no pallor Psychiatry: Judgement and insight appear normal. Mood & affect appropriate.     Data Reviewed: I have personally reviewed following labs and imaging studies  CBC: Recent Labs  Lab 09/14/19 0411 09/15/19 0541 09/17/19 0546 09/18/19 0859 09/19/19 0504  WBC 20.2* 22.4* 19.0* 25.1* 22.6*  NEUTROABS  --  16.7* 13.1*  --   --   HGB 7.8* 7.9* 7.4* 9.6* 8.1*  HCT 24.4* 24.7* 24.2* 31.2* 25.6*  MCV 87.5 89.8 93.4 93.1 91.1  PLT 76* 125* 188 269 265   Basic Metabolic Panel: Recent Labs  Lab 09/15/19 0541 09/16/19 0509 09/16/19 2029 09/17/19 0546 09/18/19 0859 09/19/19 0504  NA 148* 152* 146* 142 139 136  K 2.5* 2.7* 3.3* 3.3* 3.3* 3.7  CL 102 106 106 105 101 104  CO2 35* 37* 30 28 25 23   GLUCOSE 118* 89 139* 97 145* 166*  BUN 48* 41* 41* 39* 32* 31*  CREATININE 1.23* 1.20* 1.23* 1.11*  1.21* 1.22*  CALCIUM 6.9* 7.5* 7.7* 7.4* 8.1* 7.7*  MG 1.7  --   --   --   --   --    GFR: Estimated Creatinine Clearance: 39.7 mL/min (A) (by C-G formula based on SCr of 1.22 mg/dL (H)). Liver Function Tests: Recent Labs  Lab 09/16/19 0900  AST 42*  ALT 77*  ALKPHOS 71  BILITOT 1.3*  PROT 5.9*  ALBUMIN 2.2*   No results for input(s): LIPASE, AMYLASE in the last 168 hours. No results for input(s): AMMONIA in the last 168 hours. Coagulation Profile: No results for input(s): INR, PROTIME in the last 168 hours. Cardiac Enzymes: No results for input(s): CKTOTAL, CKMB, CKMBINDEX, TROPONINI in the last 168 hours. BNP (last 3 results) No results for input(s): PROBNP in the last 8760 hours. HbA1C: No results for input(s): HGBA1C in the last 72 hours. CBG: Recent Labs  Lab 09/18/19 2058 09/19/19 0017 09/19/19 0418 09/19/19  0755 09/19/19 1211  GLUCAP 284* 194* 141* 155* 307*   Lipid Profile: No results for input(s): CHOL, HDL, LDLCALC, TRIG, CHOLHDL, LDLDIRECT in the last 72 hours. Thyroid Function Tests: No results for input(s): TSH, T4TOTAL, FREET4, T3FREE, THYROIDAB in the last 72 hours. Anemia Panel: No results for input(s): VITAMINB12, FOLATE, FERRITIN, TIBC, IRON, RETICCTPCT in the last 72 hours. Sepsis Labs: No results for input(s): PROCALCITON, LATICACIDVEN in the last 168 hours.  Recent Results (from the past 240 hour(s))  Culture, blood (Routine x 2)     Status: Abnormal   Collection Time: 09/09/19  4:43 PM   Specimen: BLOOD  Result Value Ref Range Status   Specimen Description   Final    BLOOD RIGHT ANTECUBITAL Performed at [facility], [address] [place], [place] [address]    Special Requests   Final    BOTTLES DRAWN AEROBIC AND ANAEROBIC Blood Culture results may not be optimal due to an excessive volume of blood received in culture bottles Performed at [facility], [address] [place], [place] [address]     Culture  Setup Time   Final    GRAM NEGATIVE RODS AEROBIC BOTTLE ONLY CRITICAL RESULT CALLED TO, READ BACK BY AND VERIFIED WITHErling Cruz: E. JACKSON PHARMD, AT [phone] 09/11/19 BY Renato Shin. VANHOOK Performed at [facility], [address] [place], [place] [address]    Culture (A)  Final    ESCHERICHIA COLI Confirmed Extended Spectrum Beta-Lactamase Producer (ESBL).  In bloodstream infections from ESBL organisms, carbapenems are preferred over piperacillin/tazobactam. They are shown to have a lower risk of mortality.    Report Status 09/13/2019 FINAL  Final   Organism ID, Bacteria ESCHERICHIA COLI  Final      Susceptibility   Escherichia coli - MIC*    AMPICILLIN >=32 RESISTANT Resistant     CEFAZOLIN >=64 RESISTANT Resistant     CEFEPIME >=32 RESISTANT Resistant     CEFTAZIDIME RESISTANT Resistant     CEFTRIAXONE >=64 RESISTANT Resistant     CIPROFLOXACIN >=4 RESISTANT Resistant     GENTAMICIN >=16 RESISTANT Resistant     IMIPENEM <=0.25 SENSITIVE Sensitive     TRIMETH/SULFA <=20 SENSITIVE Sensitive     AMPICILLIN/SULBACTAM >=32 RESISTANT Resistant     PIP/TAZO 8 SENSITIVE Sensitive     * ESCHERICHIA COLI  Blood Culture ID Panel (Reflexed)     Status: Abnormal   Collection Time: 09/09/19  4:43 PM  Result Value Ref Range Status   Enterococcus species NOT DETECTED NOT DETECTED Final   Listeria monocytogenes NOT DETECTED NOT DETECTED Final   Staphylococcus species NOT DETECTED NOT DETECTED Final   Staphylococcus aureus (BCID) NOT DETECTED NOT DETECTED Final   Streptococcus species NOT DETECTED NOT DETECTED Final   Streptococcus agalactiae NOT DETECTED NOT DETECTED Final   Streptococcus pneumoniae NOT DETECTED NOT DETECTED Final   Streptococcus pyogenes NOT DETECTED NOT DETECTED Final   Acinetobacter baumannii NOT DETECTED NOT DETECTED Final   Enterobacteriaceae species DETECTED (A) NOT DETECTED Final    Comment: Enterobacteriaceae represent a large family of gram-negative bacteria, not a  single organism. CRITICAL RESULT CALLED TO, READ BACK BY AND VERIFIED WITH: Erling Cruz. JACKSON PHARMD, AT [phone] 09/11/19 BY D. VANHOOK    Enterobacter cloacae complex NOT DETECTED NOT DETECTED Final   Escherichia coli DETECTED (A) NOT DETECTED Final    Comment: CRITICAL RESULT CALLED TO, READ BACK BY AND VERIFIED WITHErling Cruz: E. JACKSON PHARMD, AT 0729 09/11/19 BY D. Leighton RoachVANHOOK  Klebsiella oxytoca NOT DETECTED NOT DETECTED Final   Klebsiella pneumoniae NOT DETECTED NOT DETECTED Final   Proteus species NOT DETECTED NOT DETECTED Final   Serratia marcescens NOT DETECTED NOT DETECTED Final   Carbapenem resistance NOT DETECTED NOT DETECTED Final   Haemophilus influenzae NOT DETECTED NOT DETECTED Final   Neisseria meningitidis NOT DETECTED NOT DETECTED Final   Pseudomonas aeruginosa NOT DETECTED NOT DETECTED Final   Candida albicans NOT DETECTED NOT DETECTED Final   Candida glabrata NOT DETECTED NOT DETECTED Final   Candida krusei NOT DETECTED NOT DETECTED Final   Candida parapsilosis NOT DETECTED NOT DETECTED Final   Candida tropicalis NOT DETECTED NOT DETECTED Final    Comment: Performed at [facility], [address] [place], [place] [address]  Culture, blood (Routine x 2)     Status: Abnormal   Collection Time: 09/09/19  4:45 PM   Specimen: BLOOD LEFT FOREARM  Result Value Ref Range Status   Specimen Description   Final    BLOOD LEFT FOREARM Performed at [facility], [address], [place], [place] [address]    Special Requests   Final    BOTTLES DRAWN AEROBIC AND ANAEROBIC Blood Culture results may not be optimal due to an excessive volume of blood received in culture bottles Performed at [facility], [address] [place], [place] [address]    Culture  Setup Time   Final    IN BOTH AEROBIC AND ANAEROBIC BOTTLES GRAM NEGATIVE RODS CRITICAL VALUE NOTED.  VALUE IS CONSISTENT WITH PREVIOUSLY REPORTED AND CALLED VALUE.    Culture (A)  Final     ESCHERICHIA COLI SUSCEPTIBILITIES PERFORMED ON PREVIOUS CULTURE WITHIN THE LAST 5 DAYS. Performed at [facility], [address] [place], [place] [address]    Report Status 09/14/2019 FINAL  Final  SARS CORONAVIRUS 2 (TAT 6-24 HRS) Nasopharyngeal Nasopharyngeal Swab     Status: None   Collection Time: 09/09/19  6:45 PM   Specimen: Nasopharyngeal Swab  Result Value Ref Range Status   SARS Coronavirus 2 NEGATIVE NEGATIVE Final    Comment: (NOTE) SARS-CoV-2 target nucleic acids are NOT DETECTED. The SARS-CoV-2 RNA is generally detectable in upper and lower respiratory specimens during the acute phase of infection. Negative results do not preclude SARS-CoV-2 infection, do not rule out co-infections with other pathogens, and should not be used as the sole basis for treatment or other patient management decisions. Negative results must be combined with clinical observations, patient history, and epidemiological information. The expected result is Negative. Fact Sheet for Patients: HairSlick.no Fact Sheet for Healthcare Providers: quierodirigir.com This test is not yet approved or cleared by the [place] FDA and  has been authorized for detection and/or diagnosis of SARS-CoV-2 by FDA under an Emergency Use Authorization (EUA). This EUA will remain  in effect (meaning this test can be used) for the duration of the COVID-19 declaration under Section 56 4(b)(1) of the Act, 21 U.S.C. section 360bbb-3(b)(1), unless the authorization is terminated or revoked sooner. Performed at [facility], [address], [place], [place] [address]   Urine culture     Status: Abnormal   Collection Time: 09/09/19  7:00 PM   Specimen: Urine, Random  Result Value Ref Range Status   Specimen Description   Final    URINE, RANDOM Performed at [facility], [address], [place], [place] [address]    Special Requests    Final  NONE Performed at [facility], [place] [address], [place], [place] [address]    Culture (A)  Final    <10,000 COLONIES/mL INSIGNIFICANT GROWTH Performed at [place] [address], [place], [place] [address]    Report Status 09/10/2019 FINAL  Final  Urine culture     Status: Abnormal   Collection Time: 09/10/19  9:32 AM   Specimen: Urine, Catheterized  Result Value Ref Range Status   Specimen Description   Final    URINE, CATHETERIZED RECD IN A SYRINGE Performed at [facility], [place] [address] [place], [place] [address]    Special Requests   Final    Normal Performed at [facility] [address] [place], [place] [address]    Culture (A)  Final    >=100,000 COLONIES/mL ESCHERICHIA COLI Confirmed Extended Spectrum Beta-Lactamase Producer (ESBL).  In bloodstream infections from ESBL organisms, carbapenems are preferred over piperacillin/tazobactam. They are shown to have a lower risk of mortality.    Report Status 09/12/2019 FINAL  Final   Organism ID, Bacteria ESCHERICHIA COLI (A)  Final      Susceptibility   Escherichia coli - MIC*    AMPICILLIN >=32 RESISTANT Resistant     CEFAZOLIN >=64 RESISTANT Resistant     CEFTRIAXONE >=64 RESISTANT Resistant     CIPROFLOXACIN >=4 RESISTANT Resistant     GENTAMICIN >=16 RESISTANT Resistant     IMIPENEM <=0.25 SENSITIVE Sensitive     NITROFURANTOIN <=16 SENSITIVE Sensitive     TRIMETH/SULFA <=20 SENSITIVE Sensitive     AMPICILLIN/SULBACTAM >=32 RESISTANT Resistant     PIP/TAZO 8 SENSITIVE Sensitive     * >=100,000 COLONIES/mL ESCHERICHIA COLI  MRSA PCR Screening     Status: None   Collection Time: 09/10/19  6:58 PM   Specimen: Nasal Mucosa; Nasopharyngeal  Result Value Ref Range Status   MRSA by PCR NEGATIVE NEGATIVE Final    Comment:        The GeneXpert MRSA Assay (FDA approved for NASAL specimens only), is one component of a comprehensive MRSA  colonization surveillance program. It is not intended to diagnose MRSA infection nor to guide or monitor treatment for MRSA infections. Performed at [facility], [place] [address], [place], [place] [address]   Culture, blood (routine x 2)     Status: None (Preliminary result)   Collection Time: 09/18/19  8:59 AM   Specimen: BLOOD LEFT HAND  Result Value Ref Range Status   Specimen Description   Final    BLOOD LEFT HAND Performed at [place] [address], [place], [place] [address]    Special Requests   Final    BOTTLES DRAWN AEROBIC ONLY Blood Culture results may not be optimal due to an inadequate volume of blood received in culture bottles Performed at [facility] [address] [place], [place] [address]    Culture   Final    NO GROWTH 1 DAY Performed at [facility], [place] [address], [place], [place] [address]    Report Status PENDING  Incomplete  Culture, blood (routine x 2)     Status: None (Preliminary result)   Collection Time: 09/18/19  8:59 AM   Specimen: BLOOD RIGHT HAND  Result Value Ref Range Status   Specimen Description   Final    BLOOD RIGHT HAND Performed at [place] [address], [place], [place] [address]    Special Requests  Final    BOTTLES DRAWN AEROBIC AND ANAEROBIC Blood Culture adequate volume Performed at [facility], [address] [place], [place] [address]    Culture   Final    NO GROWTH 1 DAY Performed at [facility], [address], [place], [place] [address]    Report Status PENDING  Incomplete         Radiology Studies: No results found.      Scheduled Meds: . atorvastatin  20 mg Oral q1800  . chlorhexidine  15 mL Mouth Rinse BID  . Chlorhexidine Gluconate Cloth  6 each Topical Daily  . feeding supplement (ENSURE ENLIVE)  237 mL Oral Q24H  . heparin injection (subcutaneous)  5,000 Units Subcutaneous  Q8H  . insulin aspart  3-9 Units Subcutaneous Q4H  . insulin glargine  10 Units Subcutaneous QHS  . mouth rinse  15 mL Mouth Rinse q12n4p  . multivitamin with minerals  1 tablet Oral Daily  . sodium chloride flush  10-40 mL Intracatheter Q12H   Continuous Infusions: . sodium chloride Stopped (09/13/19 1916)  . sodium chloride Stopped (09/16/19 0908)  . meropenem (MERREM) IV 1 g (09/19/19 1223)     LOS: 9 days    Time spent:25 mins. More than 50% of that time was spent in counseling and/or coordination of care.      Burn[place]ette Pop, MD [facility] Pager [phone]  If 7PM-7AM, please contact night-coverage [email or web address] Password [email or web address] 09/19/2019, 2:07 PM

## 2019-09-20 LAB — CBC
HCT: 26.8 % — ABNORMAL LOW (ref 36.0–46.0)
Hemoglobin: 8.4 g/dL — ABNORMAL LOW (ref 12.0–15.0)
MCH: 28.7 pg (ref 26.0–34.0)
MCHC: 31.3 g/dL (ref 30.0–36.0)
MCV: 91.5 fL (ref 80.0–100.0)
Platelets: 323 10*3/uL (ref 150–400)
RBC: 2.93 MIL/uL — ABNORMAL LOW (ref 3.87–5.11)
RDW: 15.2 % (ref 11.5–15.5)
WBC: 20.9 10*3/uL — ABNORMAL HIGH (ref 4.0–10.5)
nRBC: 0 % (ref 0.0–0.2)

## 2019-09-20 LAB — BASIC METABOLIC PANEL
Anion gap: 7 (ref 5–15)
BUN: 25 mg/dL — ABNORMAL HIGH (ref 8–23)
CO2: 22 mmol/L (ref 22–32)
Calcium: 7.9 mg/dL — ABNORMAL LOW (ref 8.9–10.3)
Chloride: 107 mmol/L (ref 98–111)
Creatinine, Ser: 1.05 mg/dL — ABNORMAL HIGH (ref 0.44–1.00)
GFR calc Af Amer: >60 mL/min (ref >60)
GFR calc non Af Amer: 55 mL/min — ABNORMAL LOW (ref >60)
Glucose, Bld: 159 mg/dL — ABNORMAL HIGH (ref 70–99)
Potassium: 4.1 mmol/L (ref 3.5–5.1)
Sodium: 136 mmol/L (ref 135–145)

## 2019-09-20 LAB — GLUCOSE, CAPILLARY: Glucose-Capillary: 226 mg/dL — ABNORMAL HIGH (ref 70–99)

## 2019-09-20 MED ORDER — METFORMIN HCL 1000 MG PO TABS: 1000.0000 mg | ORAL_TABLET | Freq: Two times a day (BID) | ORAL | 1 refills | Status: AC

## 2019-09-20 NOTE — TOC Initial Note (Signed)
Transition of Care [facility]) - Initial/Assessment Note    Patient Details  Name: Rachel Nelson MRN: [id number] Date of Birth: 1951/06/28  [facility] CM/SW Contact:    Lynnell Catalan, RN Phone Number: 09/20/2019, 11:33 AM  Clinical Narrative:                 Family wants to take pt home with [facility] instead of CIR. They have 24hr care for pt at home.   Expected Discharge Plan: [place] Barriers to Discharge: No Barriers Identified   Patient Goals and CMS Choice   CMS Medicare.gov Compare Post Acute Care list provided to:: Patient Choice offered to / list presented to : Patient  Expected Discharge Plan and Services Expected Discharge Plan: [place]   Discharge Planning Services: CM Consult   Living arrangements for the past 2 months: Single Family Home Expected Discharge Date: 09/20/19                         HH Arranged: PT, OT, Nurse's Aide HH Agency: [place] Date [facility] Agency Contacted: 09/20/19 Time HH Agency Contacted: 1000 Representative spoke with at [place]: Tommi Rumps  Prior Living Arrangements/Services Living arrangements for the past 2 months: [place] Lives with:: Adult Children Patient language and need for interpreter reviewed:: Yes Do you feel safe going back to the place where you live?: Yes      Need for Family Participation in Patient Care: Yes (Comment) Care giver support system in place?: Yes (comment)      Activities of Daily Living Home Assistive Devices/Equipment: CBG Meter ADL Screening (condition at time of admission) Patient's cognitive ability adequate to safely complete daily activities?: Yes Is the patient deaf or have difficulty hearing?: No Does the patient have difficulty seeing, even when wearing glasses/contacts?: No Does the patient have difficulty concentrating, remembering, or making decisions?: No Patient able to express need for assistance with ADLs?: Yes Does  the patient have difficulty dressing or bathing?: Yes(secondary to weakness) Independently performs ADLs?: No(secondary to weakness and malaise) Communication: Independent Dressing (OT): Needs assistance Is this a change from baseline?: Change from baseline, expected to last >3 days Grooming: Needs assistance Is this a change from baseline?: Change from baseline, expected to last >3 days Feeding: Independent Bathing: Needs assistance Is this a change from baseline?: Change from baseline, expected to last >3 days Toileting: Needs assistance Is this a change from baseline?: Change from baseline, expected to last >3days In/Out Bed: Needs assistance Is this a change from baseline?: Change from baseline, expected to last >3 days Walks in Home: Needs assistance Is this a change from baseline?: Change from baseline, expected to last >3 days Does the patient have difficulty walking or climbing stairs?: Yes(secondary to weakness and malaise) Weakness of Legs: Both Weakness of Arms/Hands: None  Permission Sought/Granted         Permission granted to share info w AGENCY: [facility]        Emotional Assessment Appearance:: Appears stated age            Admission diagnosis:  Pyelonephritis [N12] Acute respiratory failure with hypoxia ([place]) [J96.01] Sepsis due to urinary tract infection ([place]) [A41.9, N39.0] Septic shock ([place]) [A41.9, R65.21] Sepsis ([place]) [A41.9] Other hydronephrosis [N13.39] Patient Active Problem List   Diagnosis Date Noted  . Acute respiratory failure with hypoxia ([place])   . Sepsis due to urinary tract infection ([place]) 09/10/2019  .  Acute kidney injury (HCC) 09/10/2019  . Septic shock (HCC) 09/10/2019  . Pyelonephritis   . Hyponatremia 03/21/2017  . Normocytic anemia 03/21/2017  . Acute pyelonephritis 03/21/2017  . Renal insufficiency 03/21/2017  . Diabetes mellitus type II, non insulin dependent (HCC) 03/21/2017   PCP:  Georgianne Fick, MD Pharmacy:    [facility] [phone] - [place], [place] - [address] AT [facility] OF [address] [place] [address] Phone: [phone] Fax: [phone]     Social Determinants of Health (SDOH) Interventions    Readmission Risk Interventions Readmission Risk Prevention Plan 09/20/2019  Transportation Screening Complete  PCP or Specialist Appt within 3-5 Days Complete  HRI or Home Care Consult Complete  Social Work Consult for Recovery Care Planning/Counseling Complete  Palliative Care Screening Not Applicable  Medication Review Oceanographer) Complete  Some recent data might be hidden

## 2019-09-20 NOTE — Discharge Summary (Signed)
Physician Discharge Summary  June Rode ZOX:[id number] DOB: October 24, 1950 DOA: 09/09/2019  PCP: Georgianne Fick, MD  Admit date: 09/09/2019 Discharge date: 09/20/2019  Admitted From: Home Disposition:  Home  Discharge Condition:Stable CODE STATUS:FULL Diet recommendation: Carb Modified   Brief/Interim Summary:  Patient is a 69 year old female who was admitted on 12/27 with complaints of left flank pain, chills, malaise.  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  She was found to be in septic shock . CT imaging showed left ureteral calculi with obstructing hydronephrosis, left emphysematous pyelitis.   Patient decompensated in the emergency department with worsening hypotension, increased work of breathing and she required intubation and vasopressors.  She was extubated on 09/13/2019.  Blood culture showed ESBL E. coli.  Urology consulted and she underwent left-sided percutaneous nephrostomy tube placement.  She has been transferred to hospitalist service on 09/19/2019.  Currently she is hemodynamically stable.  Initial plan was to discharge her to CIR but family decided to take her home with home health.  She will complete 10 days course of IV antibiotic today.  She is stable for discharge to  home.  She will follow-up with urology as an outpatient.  Following problems were addressed during her hospitalization:  Septic shock/gram-negative bacteremia: Currently hemodynamically stable.  Blood culture showed ESBL E. coli.  Currently on meropenem, day 10.   Total course of the antibiotic should be 10 days because this is a urinary source.  Discussed with ID. She still has significant leukocytosis.  Check CBC in a week.  Obstructive uropathy/hydronephrosis: CT imaging showed obstructing stone in the left ureter.  Urology was consulted.  She required percutaneous drain placement on 12/28.  She will follow-up with urology as an outpatient.  AKI: Resolved  Diabetes type 2: Last recent  hemoglobin A1c of 7.6.  Metformin increased to 1 g twice a day  Chronic diastolic CHF: IV Lasix discontinued.  Currently he appears euvolemic     Discharge Diagnoses:  Principal Problem:   Sepsis due to urinary tract infection (HCC) Active Problems:   Diabetes mellitus type II, non insulin dependent (HCC)   Pyelonephritis   Acute kidney injury (HCC)   Septic shock (HCC)   Acute respiratory failure with hypoxia [facility])    Discharge Instructions  Discharge Instructions    Diet Carb Modified   Complete by: As directed    Discharge instructions   Complete by: As directed    1)Please take prescribed medications as instructed. 2)Follow up with your PCP in a week.  Do a CBC, BMP test during the follow-up 3)Follow up with urology as an outpatient in 2 weeks.  Name and number of  the provider has been attached. 4)Monitor your blood sugars at home.   Increase activity slowly   Complete by: As directed      Allergies as of 09/20/2019   No Known Allergies     Medication List    STOP taking these medications   insulin glargine 100 UNIT/ML injection Commonly known as: Lantus   metFORMIN 500 MG 24 hr tablet Commonly known as: GLUCOPHAGE-XR Replaced by: metFORMIN 1000 MG tablet   sulfamethoxazole-trimethoprim 800-160 MG tablet Commonly known as: BACTRIM DS     TAKE these medications   acetaminophen 325 MG tablet Commonly known as: TYLENOL Take 325 mg by mouth every 6 (six) hours as needed for mild pain or headache.   aspirin EC 81 MG tablet Take 81 mg by mouth daily.   atorvastatin 20 MG tablet Commonly known  as: LIPITOR Take 20 mg by mouth daily.   metFORMIN 1000 MG tablet Commonly known as: Glucophage Take 1 tablet (1,000 mg total) by mouth 2 (two) times daily with a meal. Replaces: metFORMIN 500 MG 24 hr tablet   ondansetron 4 MG tablet Commonly known as: ZOFRAN Take 1 tablet (4 mg total) by mouth every 6 (six) hours. Prn n/v.      Follow-up Information     Bjorn Pippin, MD. Schedule an appointment as soon as possible for a visit in 2 week(s).   Specialty: Urology Contact information: [address] [place] [address] [phone]        Georgianne Fick, MD. Schedule an appointment as soon as possible for a visit in 1 week(s).   Specialty: Internal Medicine Contact information: [address] [place] [address] [phone]          No Known Allergies  Consultations:  Urology,PCCM   Procedures/Studies: DG Abdomen 1 View  Result Date: 09/10/2019 CLINICAL DATA:  NG tube placement. EXAM: ABDOMEN - 1 VIEW COMPARISON:  None. FINDINGS: The NG tube is in the body region of the stomach. The bowel gas pattern is unremarkable. IMPRESSION: NG tube in good position in the body region of the stomach. Electronically Signed   By: Rudie Meyer M.D.   On: 09/10/2019 05:32   DG CHEST PORT 1 VIEW  Result Date: 09/17/2019 CLINICAL DATA:  Shortness of breath. EXAM: PORTABLE CHEST 1 VIEW COMPARISON:  Radiograph yesterday. FINDINGS: Left central line remains in place. Slightly lower lung volumes. Hazy lung base opacities consistent with pleural effusions and adjacent atelectasis. Diffuse interstitial and airspace opacities are otherwise unchanged, likely pulmonary edema. Unchanged heart size and mediastinal contours. No pneumothorax. IMPRESSION: 1. Slightly lower lung volumes since yesterday. 2. Otherwise unchanged pleural effusions and interstitial opacities suspicious for pulmonary edema. Electronically Signed   By: Narda Rutherford M.D.   On: 09/17/2019 04:37   DG CHEST PORT 1 VIEW  Result Date: 09/16/2019 CLINICAL DATA:  69 year old with left flank pain, chills and malaise. EXAM: PORTABLE CHEST 1 VIEW COMPARISON:  09/15/2019 FINDINGS: Normal heart size. Small pleural effusions identified. Diffuse bilateral interstitial and airspace densities are identified, unchanged from previous exam. IMPRESSION: 1. No change in aeration to  the lungs compared with previous exam. 2. Stable small bilateral pleural effusions. Electronically Signed   By: Signa Kell M.D.   On: 09/16/2019 10:02   DG CHEST PORT 1 VIEW  Result Date: 09/15/2019 CLINICAL DATA:  Shortness of breath EXAM: PORTABLE CHEST 1 VIEW COMPARISON:  09/13/2019 FINDINGS: Cardiac shadow is stable. Left jugular central line is again seen. Increased perihilar densities and airspace opacities throughout both lungs have increased somewhat in the interval from the prior exam particularly in the left lung. These changes likely represent progressive congestive failure with edema. No sizable effusion is noted. No bony abnormality is seen. IMPRESSION: Progressive increased density within the lungs particularly on the left consistent with progressive edema. Electronically Signed   By: Alcide Clever M.D.   On: 09/15/2019 12:27   DG Chest Port 1 View  Result Date: 09/13/2019 CLINICAL DATA:  SOB and hypoxia today; pt id checked with RN EXAM: PORTABLE CHEST 1 VIEW COMPARISON:  Chest radiograph 09/13/2019 at 4:42 a.m. FINDINGS: Interval extubation and removal of an NG tube. A left central venous catheter remains in place. Stable cardiomediastinal contours. There are bilateral interstitial opacities with confluence at the right lung apex suspicious for edema or atypical infection. Haziness in  the bilateral lung bases likely a combination of pleural effusions and atelectasis. No pneumothorax. No acute finding in the visualized skeleton. IMPRESSION: 1. Bilateral interstitial opacities with confluence at the right lung apex suspicious for edema and/or atypical infection. 2. Haziness at the lung bases likely a combination of pleural effusions and atelectasis. Electronically Signed   By: Emmaline Kluver M.D.   On: 09/13/2019 19:32   DG CHEST PORT 1 VIEW  Result Date: 09/13/2019 CLINICAL DATA:  Respiratory failure. Endotracheal tube position. EXAM: PORTABLE CHEST 1 VIEW COMPARISON:  Radiograph  yesterday. FINDINGS: Endotracheal tube tip at the thoracic inlet. Enteric tube in place with tip below the diaphragm not included in the field of view. Left internal jugular central line tip in the lower SVC. Hazy opacities in the mid lower lung zones likely combination of pleural fluid and airspace disease/atelectasis. Unchanged heart size and mediastinal contours. Overall no significant change from prior exam. IMPRESSION: 1. Unchanged appearance of the chest. Hazy opacities in the mid and lower lung zones, likely combination of pleural fluid and airspace disease/atelectasis. 2. Stable support apparatus. Electronically Signed   By: Narda Rutherford M.D.   On: 09/13/2019 06:41   DG CHEST PORT 1 VIEW  Result Date: 09/12/2019 CLINICAL DATA:  Respiratory failure. EXAM: PORTABLE CHEST 1 VIEW COMPARISON:  September 11, 2019. FINDINGS: Stable cardiomegaly. Endotracheal and nasogastric tubes are unchanged in position. No pneumothorax is noted. Left internal jugular catheter is unchanged in position. Stable bibasilar opacities are noted concerning for atelectasis or edema with associated pleural effusions. Bony thorax is unremarkable. IMPRESSION: Stable support apparatus. Stable bibasilar opacities are noted concerning for atelectasis or edema with associated pleural effusions. Electronically Signed   By: Lupita Raider M.D.   On: 09/12/2019 07:25   DG Chest Port 1 View  Result Date: 09/11/2019 CLINICAL DATA:  Respiratory failure. EXAM: PORTABLE CHEST 1 VIEW COMPARISON:  Radiograph yesterday. FINDINGS: Endotracheal tube tip 13 mm from the carina. Enteric tube in place, tip not included in the field of view below the diaphragm. Left internal jugular central venous catheter tip in the lower SVC. Progressive hazy opacities throughout both lungs, most prominent at the bases. No visualized pneumothorax. Unchanged heart size and mediastinal contours. Left nephrostomy tube partially included. IMPRESSION: 1. Endotracheal  tube tip 13 mm from the carina. Enteric tube and left central line remain in place. 2. Progressive hazy opacities throughout both lungs, most prominent at the bases. Findings are suspicious for developing pulmonary edema and pleural effusions. Electronically Signed   By: Narda Rutherford M.D.   On: 09/11/2019 05:59   DG Chest Portable 1 View  Result Date: 09/10/2019 CLINICAL DATA:  Central line placement EXAM: PORTABLE CHEST 1 VIEW COMPARISON:  Earlier today FINDINGS: Stable positioning of existing hardware. New left IJ line with tip at the upper cavoatrial junction. No visible pneumothorax. Stable interstitial coarsening with a few Kerley lines, the patient has received 4 L of IVF. No visible effusion. Normal heart size. IMPRESSION: New central line without complicating feature. Stable interstitial opacity, likely mild edema. Electronically Signed   By: Marnee Spring M.D.   On: 09/10/2019 07:02   DG Chest Portable 1 View  Result Date: 09/10/2019 CLINICAL DATA:  Intubation EXAM: PORTABLE CHEST 1 VIEW COMPARISON:  Yesterday FINDINGS: Endotracheal tube tip is just below the clavicular heads. The enteric tube at least reaches the stomach. Normal heart size. Prominent vascular pedicle widening without bronchus depression or apical capping. This is attributed to supine positioning as there was  no paratracheal thickening on comparison upright film. There are a few Kerley lines in generalized interstitial coarsening. No effusion or pneumothorax. IMPRESSION: 1. Unremarkable hardware positioning. 2. Mild interstitial opacity that could be atelectasis or mild edema. Electronically Signed   By: Marnee Spring M.D.   On: 09/10/2019 04:16   DG Chest Port 1 View  Result Date: 09/09/2019 CLINICAL DATA:  Generalized weakness and malaise. Back pain for 3 days. EXAM: PORTABLE CHEST 1 VIEW COMPARISON:  One-view chest x-ray 07/27/2016 FINDINGS: The heart size is normal. Atherosclerotic changes are noted at the aortic  arch. Mild bibasilar airspace opacities are present, left greater than right. Upper lung fields are clear. Small left effusion is suspected. IMPRESSION: 1. Mild bibasilar airspace disease, left greater than right. While this likely reflects atelectasis, infection is not excluded. 2. Probable small left pleural effusion. Electronically Signed   By: Marin Roberts M.D.   On: 09/09/2019 17:18   ECHOCARDIOGRAM COMPLETE  Result Date: 09/10/2019   ECHOCARDIOGRAM REPORT   Patient Name:   DALAL LIVENGOOD Date of Exam: 09/10/2019 Medical Rec #:  [id number]    Height:       65.0 in Accession #:    [id number]   Weight:       119.0 lb Date of Birth:  06-21-1951    BSA:          1.59 m Patient Age:    68 years     BP:           118/35 mmHg Patient Gender: F            HR:           117 bpm. Exam Location:  Inpatient Procedure: 2D Echo, Cardiac Doppler and Color Doppler Indications:    Hypotension  History:        Patient has no prior history of Echocardiogram examinations.                 Risk Factors:Diabetes. Septic shock, elevated troponin, resp.                 failure, kidney failure.  Sonographer:    Lavenia Atlas Referring Phys: [phone] JAMES KIM IMPRESSIONS  1. Left ventricular ejection fraction, by visual estimation, is 60 to 65%. The left ventricle has normal function. There is no left ventricular hypertrophy.  2. Left ventricular diastolic parameters are consistent with Grade I diastolic dysfunction (impaired relaxation).  3. The left ventricle has no regional wall motion abnormalities.  4. Global right ventricle has normal systolic function.The right ventricular size is normal. No increase in right ventricular wall thickness.  5. Left atrial size was normal.  6. Right atrial size was normal.  7. The mitral valve is abnormal. Mild mitral valve regurgitation.  8. The tricuspid valve is grossly normal.  9. The aortic valve is tricuspid. Aortic valve regurgitation is not visualized. 10. The pulmonic valve was  grossly normal. Pulmonic valve regurgitation is not visualized. 11. Moderately elevated pulmonary artery systolic pressure. 12. The inferior vena cava is dilated in size with <50% respiratory variability, suggesting right atrial pressure of 15 mmHg. FINDINGS  Left Ventricle: Left ventricular ejection fraction, by visual estimation, is 60 to 65%. The left ventricle has normal function. The left ventricle has no regional wall motion abnormalities. There is no left ventricular hypertrophy. Left ventricular diastolic parameters are consistent with Grade I diastolic dysfunction (impaired relaxation). Indeterminate filling pressures. Right Ventricle: The right ventricular size is normal. No increase in right  ventricular wall thickness. Global RV systolic function is has normal systolic function. The tricuspid regurgitant velocity is 2.72 m/s, and with an assumed right atrial pressure  of 15 mmHg, the estimated right ventricular systolic pressure is moderately elevated at 44.6 mmHg. Left Atrium: Left atrial size was normal in size. Right Atrium: Right atrial size was normal in size Pericardium: There is no evidence of pericardial effusion. Mitral Valve: The mitral valve is abnormal. There is mild thickening of the mitral valve leaflet(s). Mild mitral valve regurgitation. Tricuspid Valve: The tricuspid valve is grossly normal. Tricuspid valve regurgitation is trivial. Aortic Valve: The aortic valve is tricuspid. Aortic valve regurgitation is not visualized. Aortic valve peak gradient measures 14.0 mmHg. Pulmonic Valve: The pulmonic valve was grossly normal. Pulmonic valve regurgitation is not visualized. Pulmonic regurgitation is not visualized. Aorta: The aortic root and ascending aorta are structurally normal, with no evidence of dilitation. Venous: The inferior vena cava is dilated in size with less than 50% respiratory variability, suggesting right atrial pressure of 15 mmHg. IAS/Shunts: No atrial level shunt detected  by color flow Doppler.  LEFT VENTRICLE PLAX 2D LVIDd:         4.30 cm  Diastology LVIDs:         2.90 cm  LV e' lateral:   10.10 cm/s LV PW:         0.90 cm  LV E/e' lateral: 11.8 LV IVS:        1.00 cm  LV e' medial:    10.90 cm/s LVOT diam:     1.70 cm  LV E/e' medial:  10.9 LV SV:         51 ml LV SV Index:   32.41 LVOT Area:     2.27 cm  RIGHT VENTRICLE RV Basal diam:  2.00 cm RV S prime:     12.00 cm/s TAPSE (M-mode): 2.5 cm LEFT ATRIUM           Index       RIGHT ATRIUM           Index LA diam:      3.00 cm 1.89 cm/m  RA Area:     10.60 cm LA Vol (A2C): 31.4 ml 19.79 ml/m RA Volume:   23.40 ml  14.75 ml/m LA Vol (A4C): 23.5 ml 14.81 ml/m  AORTIC VALVE AV Area (Vmax): 1.46 cm AV Vmax:        187.00 cm/s AV Peak Grad:   14.0 mmHg LVOT Vmax:      120.00 cm/s LVOT Vmean:     81.400 cm/s LVOT VTI:       0.188 m  AORTA Ao Root diam: 2.80 cm MITRAL VALVE                         TRICUSPID VALVE MV Area (PHT): 6.32 cm              TR Peak grad:   29.6 mmHg MV PHT:        34.80 msec            TR Vmax:        293.00 cm/s MV Decel Time: 120 msec MV E velocity: 119.00 cm/s 103 cm/s  SHUNTS MV A velocity: 94.10 cm/s  70.3 cm/s Systemic VTI:  0.19 m MV E/A ratio:  1.26        1.5       Systemic Diam: 1.70 cm  Zoila ShutterKenneth Hilty MD Electronically  signed by Zoila Shutter MD Signature Date/Time: 09/10/2019/5:12:23 PM    Final    CT Renal Stone Study  Addendum Date: 09/10/2019   ADDENDUM REPORT: 09/10/2019 04:34 ADDENDUM: Case reviewed in conjunction with post intubation chest x-ray. Mid left ureteral calculi are noted just beyond the gonadal vein crossing - there is a 15 mm long segment of clustered calculi measuring up to 7 mm in thickness. Left emphysematous pyelitis as noted previously. These results were called by telephone at the time of interpretation on 09/10/2019 at 4:34 am to provider Dr. Karie Mainland, who verbally acknowledged these results. Electronically Signed   By: Marnee Spring M.D.   On: 09/10/2019 04:34    Result Date: 09/10/2019 CLINICAL DATA:  Back and flank pain laterality not specified, question kidney stone disease EXAM: CT ABDOMEN AND PELVIS WITHOUT CONTRAST TECHNIQUE: Multidetector CT imaging of the abdomen and pelvis was performed following the standard protocol without IV contrast. Sagittal and coronal MPR images reconstructed from axial data set. No oral contrast administered. COMPARISON:  03/21/2018 FINDINGS: Lower chest: Bibasilar atelectasis versus infiltrate in lower lobes. Hepatobiliary: Gallbladder and liver normal appearance Pancreas: Normal appearance Spleen: Normal appearance Adrenals/Urinary Tract: Adrenal glands and RIGHT kidney normal appearance. LEFT hydronephrosis and hydroureter with perinephric edema. No definite ureteral calcification visualized. Single small focus of gas at upper pole of LEFT kidney. No renal masses or calculi identified. RIGHT ureter decompressed. Bladder unremarkable; no air in bladder. Stomach/Bowel: Stomach decompressed. Large and small bowel loops unremarkable. Low lying cecum in pelvis. Appendix not visualized. Vascular/Lymphatic: Few pelvic phleboliths. Atherosclerotic calcifications aorta without aneurysm. Prominent LEFT para-aortic lymph nodes at the level of the kidney, largest 11 mm short axis image 34. Reproductive: Unremarkable uterus and ovaries Other: No free air or free fluid.  No hernia. Musculoskeletal: Osseous demineralization. IMPRESSION: LEFT hydronephrosis, hydroureter, and perinephric edema though no definite ureteral calculus is visualized. This could be due to a passed renal calculus. However, a single focus of gas is identified at the upper pole of the LEFT kidney, raising question that these findings could be due to emphysematous pyelonephritis rather than stone disease; recommend correlation with urinalysis. Repeat CT imaging could also be performed with IV contrast if urinalysis is nondiagnostic, which would allow for detection of ureteral  enhancement and the typical patchy renal parenchymal enhancement of pyelonephritis. Findings called to Dietrich Pates PA on 09/09/2019 at 1843 hours. Electronically Signed: By: Ulyses Southward M.D. On: 09/09/2019 18:47   IR NEPHROSTOMY PLACEMENT LEFT  Result Date: 09/10/2019 INDICATION: Patient admitted with urosepsis found to have obstructing left-sided ureteral stone. Request made for placement of image guided left-sided percutaneous nephrostomy catheter for infection source control purposes. EXAM: 1. ULTRASOUND GUIDANCE FOR PUNCTURE OF THE LEFT RENAL COLLECTING SYSTEM 2. LEFT PERCUTANEOUS NEPHROSTOMY TUBE PLACEMENT. COMPARISON:  CT abdomen and pelvis - 09/10/2019 MEDICATIONS: Patient admitted to the hospital receiving intravenous antibiotics; the antibiotic was administered in an appropriate time frame prior to skin puncture. ANESTHESIA/SEDATION: None, the patient is currently intubated CONTRAST:  15 cc Omnipaque 300 administered into the collecting system FLUOROSCOPY TIME:  1 minute, 6 seconds (13 mGy) COMPLICATIONS: None immediate. PROCEDURE: The procedure, risks, benefits, and alternatives were explained to the patient's son. Questions regarding the procedure were encouraged and answered. The patient's son understands and consents to the procedure. A timeout was performed prior to the initiation of the procedure. The left flank region was prepped with Betadine in a sterile fashion, and a sterile drape was applied covering the operative field. A sterile  gown and sterile gloves were used for the procedure. Local anesthesia was provided with 1% Lidocaine with epinephrine. Ultrasound was used to localize the left kidney. Under direct ultrasound guidance, a 21 gauge needle was advanced into the renal collecting system. An ultrasound image documentation was performed. Access within the collecting system was confirmed with the efflux of urine followed by contrast injection. Over a Nitrex wire, the tract was dilated  with an Accustick stent. Next, a short Amplatz wire was coiled in the left renal collecting system. Under intermittent fluoroscopic guidance, the track was dilated ultimately allowing placement of a 10-French percutaneous nephrostomy catheter with end coiled and locked within left renal pelvis. Contrast was injected and several sport radiographs were obtained in various obliquities confirming access. A small sample of purulent appearing aspirated urine was capped and sent laboratory analysis. The catheter was secured at the skin with a Prolene retention suture and a gravity bag was placed. A dressing was placed. The patient tolerated procedure well without immediate postprocedural complication. FINDINGS: Ultrasound scanning demonstrates a moderately dilated collecting system as demonstrated on preceding abdominal CT. Under direct ultrasound guidance, a posterior inferior calix was targeted allowing advancement of an 10-French percutaneous nephrostomy catheter under intermittent fluoroscopic guidance. Contrast injection confirmed appropriate positioning. IMPRESSION: 1. Successful ultrasound and fluoroscopic guided placement of a left sided 10 French PCN. 2. Small sample of aspirated purulent appearing urine was capped and sent to the laboratory for analysis. Electronically Signed   By: Simonne Come M.D.   On: 09/10/2019 09:35       Subjective:  Patient seen and examined at the bedside this morning. Hemodynamically  stable for discharge today.  Discharge Exam: Vitals:   09/19/19 2110 09/20/19 0613  BP: (!) 126/50 (!) 102/57  Pulse: 70 67  Resp: 17 17  Temp: 99.2 F (37.3 C) (!) 97.5 F (36.4 C)  SpO2: 95% 98%   Vitals:   09/19/19 1156 09/19/19 1501 09/19/19 2110 09/20/19 0613  BP: (!) 94/46 (!) 119/47 (!) 126/50 (!) 102/57  Pulse: 79 75 70 67  Resp: 18  17 17   Temp: 98.1 F (36.7 C)  99.2 F (37.3 C) (!) 97.5 F (36.4 C)  TempSrc: Oral  Oral Oral  SpO2: 99%  95% 98%  Weight:       Height:        General: Pt is alert, awake, not in acute distress Cardiovascular: RRR, S1/S2 +, no rubs, no gallops Respiratory: CTA bilaterally, no wheezing, no rhonchi Abdominal: Soft, NT, ND, bowel sounds +, left nephrostomy tube Extremities: no edema, no cyanosis    The results of significant diagnostics from this hospitalization (including imaging, microbiology, ancillary and laboratory) are listed below for reference.     Microbiology: Recent Results (from the past 240 hour(s))  MRSA PCR Screening     Status: None   Collection Time: 09/10/19  6:58 PM   Specimen: Nasal Mucosa; Nasopharyngeal  Result Value Ref Range Status   MRSA by PCR NEGATIVE NEGATIVE Final    Comment:        The GeneXpert MRSA Assay (FDA approved for NASAL specimens only), is one component of a comprehensive MRSA colonization surveillance program. It is not intended to diagnose MRSA infection nor to guide or monitor treatment for MRSA infections. Performed at [facility], [address] [place], [place] [address]   Culture, blood (routine x 2)     Status: None (Preliminary result)   Collection Time: 09/18/19  8:59 AM  Specimen: BLOOD LEFT HAND  Result Value Ref Range Status   Specimen Description   Final    BLOOD LEFT HAND Performed at [facility] [address], [place], [place] [address]    Special Requests   Final    BOTTLES DRAWN AEROBIC ONLY Blood Culture results may not be optimal due to an inadequate volume of blood received in culture bottles Performed at [facility] [address] [place], [place] [address]    Culture   Final    NO GROWTH 1 DAY Performed at [facility], [place] [address] [place], [place] [address]    Report Status PENDING  Incomplete  Culture, blood (routine x 2)     Status: None (Preliminary result)   Collection Time: 09/18/19  8:59 AM   Specimen: BLOOD RIGHT HAND  Result Value Ref Range  Status   Specimen Description   Final    BLOOD RIGHT HAND Performed at [facility] [address], [place], [place] [address]    Special Requests   Final    BOTTLES DRAWN AEROBIC AND ANAEROBIC Blood Culture adequate volume Performed at [facility] [address] [place], [place] [address]    Culture   Final    NO GROWTH 1 DAY Performed at [facility], [place] [address], [place], [place] [address]    Report Status PENDING  Incomplete     Labs: BNP (last 3 results) No results for input(s): BNP in the last 8760 hours. Basic Metabolic Panel: Recent Labs  Lab 09/15/19 0541 09/16/19 2029 09/17/19 0546 09/18/19 0859 09/19/19 0504 09/20/19 0536  NA 148* 146* 142 139 136 136  K 2.5* 3.3* 3.3* 3.3* 3.7 4.1  CL 102 106 105 101 104 107  CO2 35* 30 28 25 23 22   GLUCOSE 118* 139* 97 145* 166* 159*  BUN 48* 41* 39* 32* 31* 25*  CREATININE 1.23* 1.23* 1.11* 1.21* 1.22* 1.05*  CALCIUM 6.9* 7.7* 7.4* 8.1* 7.7* 7.9*  MG 1.7  --   --   --   --   --    Liver Function Tests: Recent Labs  Lab 09/16/19 0900  AST 42*  ALT 77*  ALKPHOS 71  BILITOT 1.3*  PROT 5.9*  ALBUMIN 2.2*   No results for input(s): LIPASE, AMYLASE in the last 168 hours. No results for input(s): AMMONIA in the last 168 hours. CBC: Recent Labs  Lab 09/15/19 0541 09/17/19 0546 09/18/19 0859 09/19/19 0504 09/20/19 0536  WBC 22.4* 19.0* 25.1* 22.6* 20.9*  NEUTROABS 16.7* 13.1*  --   --   --   HGB 7.9* 7.4* 9.6* 8.1* 8.4*  HCT 24.7* 24.2* 31.2* 25.6* 26.8*  MCV 89.8 93.4 93.1 91.1 91.5  PLT 125* 188 269 265 323   Cardiac Enzymes: No results for input(s): CKTOTAL, CKMB, CKMBINDEX, TROPONINI in the last 168 hours. BNP: Invalid input(s): POCBNP CBG: Recent Labs  Lab 09/19/19 0418 09/19/19 0755 09/19/19 1211 09/19/19 1622 09/20/19 1024  GLUCAP 141* 155* 307* 240* 226*   D-Dimer No results for input(s): DDIMER in the last 72 hours. Hgb A1c No results  for input(s): HGBA1C in the last 72 hours. Lipid Profile No results for input(s): CHOL, HDL, LDLCALC, TRIG, CHOLHDL, LDLDIRECT in the last 72 hours. Thyroid function studies No results for input(s): TSH, T4TOTAL, T3FREE, THYROIDAB in the last 72 hours.  Invalid input(s): FREET3 Anemia work up No results for input(s): VITAMINB12, FOLATE, FERRITIN, TIBC, IRON, RETICCTPCT  in the last 72 hours. Urinalysis    Component Value Date/Time   COLORURINE AMBER (A) 09/09/2019 1900   APPEARANCEUR CLOUDY (A) 09/09/2019 1900   LABSPEC 1.016 09/09/2019 1900   PHURINE 5.0 09/09/2019 1900   GLUCOSEU NEGATIVE 09/09/2019 1900   HGBUR MODERATE (A) 09/09/2019 1900   BILIRUBINUR NEGATIVE 09/09/2019 1900   KETONESUR NEGATIVE 09/09/2019 1900   PROTEINUR 100 (A) 09/09/2019 1900   NITRITE NEGATIVE 09/09/2019 1900   LEUKOCYTESUR MODERATE (A) 09/09/2019 1900   Sepsis Labs Invalid input(s): PROCALCITONIN,  WBC,  LACTICIDVEN Microbiology Recent Results (from the past 240 hour(s))  MRSA PCR Screening     Status: None   Collection Time: 09/10/19  6:58 PM   Specimen: Nasal Mucosa; Nasopharyngeal  Result Value Ref Range Status   MRSA by PCR NEGATIVE NEGATIVE Final    Comment:        The GeneXpert MRSA Assay (FDA approved for NASAL specimens only), is one component of a comprehensive MRSA colonization surveillance program. It is not intended to diagnose MRSA infection nor to guide or monitor treatment for MRSA infections. Performed at [facility], [address] [place], [place] [address]   Culture, blood (routine x 2)     Status: None (Preliminary result)   Collection Time: 09/18/19  8:59 AM   Specimen: BLOOD LEFT HAND  Result Value Ref Range Status   Specimen Description   Final    BLOOD LEFT HAND Performed at [facility], [address] [place], [place] [address]    Special Requests   Final    BOTTLES DRAWN AEROBIC ONLY Blood Culture results may not be  optimal due to an inadequate volume of blood received in culture bottles Performed at [facility], [address] [place], [place] [address]    Culture   Final    NO GROWTH 1 DAY Performed at [facility], [address] [place], [place] [address]    Report Status PENDING  Incomplete  Culture, blood (routine x 2)     Status: None (Preliminary result)   Collection Time: 09/18/19  8:59 AM   Specimen: BLOOD RIGHT HAND  Result Value Ref Range Status   Specimen Description   Final    BLOOD RIGHT HAND Performed at [facility], [address] [place], [place] [address]    Special Requests   Final    BOTTLES DRAWN AEROBIC AND ANAEROBIC Blood Culture adequate volume Performed at [facility], [address] [place], [place] [address]    Culture   Final    NO GROWTH 1 DAY Performed at [facility], [address] [place], [place] [address]    Report Status PENDING  Incomplete    Please note: You were cared for by a hospitalist during your hospital stay. Once you are discharged, your primary care physician will handle any further medical issues. Please note that NO REFILLS for any discharge medications will be authorized once you are discharged, as it is imperative that you return to your primary care physician (or establish a relationship with a primary care physician if you do not have one) for your post hospital discharge needs so that they can reassess your need for medications and monitor your lab values.    Time coordinating discharge: 40 minutes  SIGNED:   Burnadette PopAmrit Vallarie Fei, MD  [facility] 09/20/2019, 11:03 AM Pager [phone]  If 7PM-7AM, please contact night-coverage [email or web address] Password [email or web address]

## 2019-09-23 LAB — CULTURE, BLOOD (ROUTINE X 2)
Culture: NO GROWTH
Culture: NO GROWTH
Special Requests: ADEQUATE

## 2019-09-24 DIAGNOSIS — E1165 Type 2 diabetes mellitus with hyperglycemia: Secondary | ICD-10-CM | POA: Diagnosis not present

## 2019-09-24 DIAGNOSIS — E781 Pure hyperglyceridemia: Secondary | ICD-10-CM | POA: Diagnosis not present

## 2019-09-24 DIAGNOSIS — D649 Anemia, unspecified: Secondary | ICD-10-CM | POA: Diagnosis not present

## 2019-09-24 DIAGNOSIS — R030 Elevated blood-pressure reading, without diagnosis of hypertension: Secondary | ICD-10-CM | POA: Diagnosis not present

## 2019-10-01 DIAGNOSIS — N1831 Chronic kidney disease, stage 3a: Secondary | ICD-10-CM | POA: Diagnosis not present

## 2019-10-01 DIAGNOSIS — N211 Calculus in urethra: Secondary | ICD-10-CM | POA: Diagnosis not present

## 2019-10-01 DIAGNOSIS — E1165 Type 2 diabetes mellitus with hyperglycemia: Secondary | ICD-10-CM | POA: Diagnosis not present

## 2019-10-01 DIAGNOSIS — E781 Pure hyperglyceridemia: Secondary | ICD-10-CM | POA: Diagnosis not present

## 2019-10-12 DIAGNOSIS — N201 Calculus of ureter: Secondary | ICD-10-CM | POA: Diagnosis not present

## 2019-10-15 ENCOUNTER — Other Ambulatory Visit: Payer: Self-pay | Admitting: Urology

## 2019-10-22 ENCOUNTER — Encounter (HOSPITAL_BASED_OUTPATIENT_CLINIC_OR_DEPARTMENT_OTHER): Payer: Self-pay | Admitting: Urology

## 2019-10-22 ENCOUNTER — Other Ambulatory Visit: Payer: Self-pay

## 2019-10-22 NOTE — Progress Notes (Addendum)
ADDENDUM:  Chart reviewed by anesthesia, Jodell Cipro PA,  Ok to proceed as long as pt is stable and no acute status change dos.   Spoke w/ via phone for pre-op interview--- Pt's son, Adeel Abts, since only speaks a little [place] Lab needs dos----  Istat 8             Lab results------ current ekg in chart/ epic COVID test ------ 10-26-2019 @ 0910 Arrive at ------- 0530 NPO after ------ MN Medications to take morning of surgery ----- NONE Diabetic medication ----- do not take metformin morning of surgery Patient Special Instructions ----- n/a Pre-Op special Istructions ----- pt son, Adeel, will interpret for pt dos, aware of only he can come in the facility Patient son verbalized understanding of instructions that were given at this phone interview. Patient son denies his mother has shortness of breath, chest pain, fever, cough a this phone interview.   Anesthesia Review: recent hospital admission 09-09-2019 until 09-20-2019 for septic shock, extubated 09-13-2019 due to UTI / ureter stone obstruction.  Pt son stated mother denies sob, chest pain, or peripheral swelling.  PCP: Dr Kathie Dike Cardiologist : no Chest x-ray : 09-17-2019 epic EKG : 09-12-2019 epic Echo : 09-10-2019 epic Cardiac Cath :  no Sleep Study/ CPAP : No Fasting Blood Sugar :      / Checks Blood Sugar -- times a day:   Pt check's daily at bedtime, not fasting Blood Thinner/ Instructions Maurice Small Dose: NO ASA / Instructions/ Last Dose :  ASA 81mg /  Per pt son was given instructions from Dr office to stop 5 days prior to surgery/  Tomorrow , 10-23-2019 last dose

## 2019-10-24 ENCOUNTER — Other Ambulatory Visit: Payer: Self-pay | Admitting: Urology

## 2019-10-26 ENCOUNTER — Other Ambulatory Visit (HOSPITAL_COMMUNITY)
Admission: RE | Admit: 2019-10-26 | Discharge: 2019-10-26 | Disposition: A | Discharge status: Home / Self Care | Payer: BC Managed Care – PPO | Source: Ambulatory Visit | Attending: Urology | Admitting: Urology

## 2019-10-26 DIAGNOSIS — Z20822 Contact with and (suspected) exposure to covid-19: Secondary | ICD-10-CM | POA: Diagnosis not present

## 2019-10-26 DIAGNOSIS — Z01812 Encounter for preprocedural laboratory examination: Secondary | ICD-10-CM | POA: Diagnosis not present

## 2019-10-26 LAB — SARS CORONAVIRUS 2 (TAT 6-24 HRS): SARS Coronavirus 2: NEGATIVE

## 2019-10-29 NOTE — Anesthesia Preprocedure Evaluation (Addendum)
Anesthesia Evaluation  Patient identified by MRN, date of birth, ID band Patient awake    Reviewed: Allergy & Precautions, NPO status , Patient's Chart, lab work & pertinent test results  Airway Mallampati: II  TM Distance: >3 FB Neck ROM: Full    Dental  (+) Dental Advisory Given   Pulmonary neg pulmonary ROS,    breath sounds clear to auscultation       Cardiovascular negative cardio ROS   Rhythm:Regular Rate:Normal     Neuro/Psych negative neurological ROS     GI/Hepatic negative GI ROS, Neg liver ROS,   Endo/Other  diabetes, Type 2, Oral Hypoglycemic Agents  Renal/GU Renal disease     Musculoskeletal   Abdominal   Peds  Hematology   Anesthesia Other Findings   Reproductive/Obstetrics                            Anesthesia Physical Anesthesia Plan  ASA: II  Anesthesia Plan: General   Post-op Pain Management:    Induction: Intravenous  PONV Risk Score and Plan: 3 and Dexamethasone, Ondansetron and Treatment may vary due to age or medical condition  Airway Management Planned: LMA  Additional Equipment:   Intra-op Plan:   Post-operative Plan: Extubation in OR  Informed Consent: I have reviewed the patients History and Physical, chart, labs and discussed the procedure including the risks, benefits and alternatives for the proposed anesthesia with the patient or authorized representative who has indicated his/her understanding and acceptance.     Dental advisory given  Plan Discussed with: CRNA  Anesthesia Plan Comments:        Anesthesia Quick Evaluation

## 2019-10-30 ENCOUNTER — Other Ambulatory Visit: Payer: Self-pay

## 2019-10-30 ENCOUNTER — Encounter (HOSPITAL_BASED_OUTPATIENT_CLINIC_OR_DEPARTMENT_OTHER)
Admission: RE | Disposition: A | Discharge status: Home / Self Care | Payer: Self-pay | Source: Home / Self Care | Attending: Urology

## 2019-10-30 ENCOUNTER — Ambulatory Visit (HOSPITAL_BASED_OUTPATIENT_CLINIC_OR_DEPARTMENT_OTHER): Payer: BC Managed Care – PPO | Admitting: Physician Assistant

## 2019-10-30 ENCOUNTER — Encounter (HOSPITAL_BASED_OUTPATIENT_CLINIC_OR_DEPARTMENT_OTHER): Payer: Self-pay | Admitting: Urology

## 2019-10-30 ENCOUNTER — Ambulatory Visit (HOSPITAL_BASED_OUTPATIENT_CLINIC_OR_DEPARTMENT_OTHER)
Admission: RE | Admit: 2019-10-30 | Discharge: 2019-10-30 | Disposition: A | Discharge status: Home / Self Care | Payer: BC Managed Care – PPO | Attending: Urology | Admitting: Urology

## 2019-10-30 DIAGNOSIS — Z936 Other artificial openings of urinary tract status: Secondary | ICD-10-CM | POA: Diagnosis not present

## 2019-10-30 DIAGNOSIS — E871 Hypo-osmolality and hyponatremia: Secondary | ICD-10-CM | POA: Diagnosis not present

## 2019-10-30 DIAGNOSIS — E119 Type 2 diabetes mellitus without complications: Secondary | ICD-10-CM | POA: Insufficient documentation

## 2019-10-30 DIAGNOSIS — N201 Calculus of ureter: Secondary | ICD-10-CM | POA: Diagnosis not present

## 2019-10-30 DIAGNOSIS — N10 Acute tubulo-interstitial nephritis: Secondary | ICD-10-CM | POA: Diagnosis not present

## 2019-10-30 DIAGNOSIS — Z7984 Long term (current) use of oral hypoglycemic drugs: Secondary | ICD-10-CM | POA: Diagnosis not present

## 2019-10-30 DIAGNOSIS — N3289 Other specified disorders of bladder: Secondary | ICD-10-CM | POA: Diagnosis not present

## 2019-10-30 HISTORY — DX: Hyperlipidemia, unspecified: E78.5

## 2019-10-30 HISTORY — DX: Anemia, unspecified: D64.9

## 2019-10-30 HISTORY — PX: HOLMIUM LASER APPLICATION: SHX5852

## 2019-10-30 HISTORY — DX: Type 2 diabetes mellitus without complications: E11.9

## 2019-10-30 HISTORY — PX: CYSTOSCOPY WITH RETROGRADE PYELOGRAM, URETEROSCOPY AND STENT PLACEMENT: SHX5789

## 2019-10-30 HISTORY — DX: Unspecified hydronephrosis: N13.30

## 2019-10-30 HISTORY — DX: Personal history of other infectious and parasitic diseases: Z86.19

## 2019-10-30 LAB — GLUCOSE, CAPILLARY: Glucose-Capillary: 140 mg/dL — ABNORMAL HIGH (ref 70–99)

## 2019-10-30 LAB — POCT I-STAT, CHEM 8
BUN: 17 mg/dL (ref 8–23)
Calcium, Ion: 1.24 mmol/L (ref 1.15–1.40)
Chloride: 104 mmol/L (ref 98–111)
Creatinine, Ser: 1 mg/dL (ref 0.44–1.00)
Glucose, Bld: 167 mg/dL — ABNORMAL HIGH (ref 70–99)
HCT: 30 % — ABNORMAL LOW (ref 36.0–46.0)
Hemoglobin: 10.2 g/dL — ABNORMAL LOW (ref 12.0–15.0)
Potassium: 4 mmol/L (ref 3.5–5.1)
Sodium: 138 mmol/L (ref 135–145)
TCO2: 22 mmol/L (ref 22–32)

## 2019-10-30 SURGERY — CYSTOURETEROSCOPY, WITH RETROGRADE PYELOGRAM AND STENT INSERTION
Anesthesia: General | Site: Ureter | Laterality: Left

## 2019-10-30 MED ORDER — DEXAMETHASONE SODIUM PHOSPHATE 10 MG/ML IJ SOLN
INTRAMUSCULAR | Status: AC
  Filled 2019-10-30: qty 1

## 2019-10-30 MED ORDER — CEFTRIAXONE SODIUM 2 G IJ SOLR
INTRAMUSCULAR | Status: AC
  Filled 2019-10-30: qty 20

## 2019-10-30 MED ORDER — PROMETHAZINE HCL 25 MG/ML IJ SOLN
6.2500 mg | INTRAMUSCULAR | Status: DC | PRN
  Filled 2019-10-30: qty 1

## 2019-10-30 MED ORDER — DEXAMETHASONE SODIUM PHOSPHATE 10 MG/ML IJ SOLN
INTRAMUSCULAR | Status: DC | PRN
  Administered 2019-10-30: 10 mg via INTRAVENOUS

## 2019-10-30 MED ORDER — FENTANYL CITRATE (PF) 100 MCG/2ML IJ SOLN
25.0000 ug | INTRAMUSCULAR | Status: DC | PRN
  Filled 2019-10-30: qty 1

## 2019-10-30 MED ORDER — FENTANYL CITRATE (PF) 100 MCG/2ML IJ SOLN
INTRAMUSCULAR | Status: AC
  Filled 2019-10-30: qty 2

## 2019-10-30 MED ORDER — MIDAZOLAM HCL 2 MG/2ML IJ SOLN
INTRAMUSCULAR | Status: AC
  Filled 2019-10-30: qty 2

## 2019-10-30 MED ORDER — LIDOCAINE 2% (20 MG/ML) 5 ML SYRINGE
INTRAMUSCULAR | Status: AC
  Filled 2019-10-30: qty 5

## 2019-10-30 MED ORDER — FENTANYL CITRATE (PF) 100 MCG/2ML IJ SOLN
INTRAMUSCULAR | Status: DC | PRN
  Administered 2019-10-30: 50 ug via INTRAVENOUS

## 2019-10-30 MED ORDER — LACTATED RINGERS IV SOLN: INTRAVENOUS | Status: DC | PRN

## 2019-10-30 MED ORDER — PROPOFOL 10 MG/ML IV BOLUS
INTRAVENOUS | Status: AC
  Filled 2019-10-30: qty 20

## 2019-10-30 MED ORDER — SODIUM CHLORIDE 0.9 % IV SOLN
2.0000 g | INTRAVENOUS | Status: AC
  Administered 2019-10-30: 2 g via INTRAVENOUS
  Filled 2019-10-30: qty 20

## 2019-10-30 MED ORDER — MIDAZOLAM HCL 2 MG/2ML IJ SOLN
INTRAMUSCULAR | Status: DC | PRN
  Administered 2019-10-30: 1 mg via INTRAVENOUS

## 2019-10-30 MED ORDER — PROPOFOL 500 MG/50ML IV EMUL
INTRAVENOUS | Status: DC | PRN
  Administered 2019-10-30: 100 mg via INTRAVENOUS

## 2019-10-30 MED ORDER — HYDROCODONE-ACETAMINOPHEN 5-325 MG PO TABS: 1.0000 | ORAL_TABLET | Freq: Four times a day (QID) | ORAL | 0 refills | Status: AC | PRN

## 2019-10-30 MED ORDER — LIDOCAINE 2% (20 MG/ML) 5 ML SYRINGE
INTRAMUSCULAR | Status: DC | PRN
  Administered 2019-10-30: 800 mg via INTRAVENOUS

## 2019-10-30 MED ORDER — SODIUM CHLORIDE 0.9 % IV SOLN
INTRAVENOUS | Status: DC
  Filled 2019-10-30: qty 1000

## 2019-10-30 MED ORDER — ONDANSETRON HCL 4 MG/2ML IJ SOLN
INTRAMUSCULAR | Status: AC
  Filled 2019-10-30: qty 2

## 2019-10-30 MED ORDER — IOHEXOL 300 MG/ML  SOLN
INTRAMUSCULAR | Status: DC | PRN
  Administered 2019-10-30: 08:00:00 5 mL via URETHRAL

## 2019-10-30 MED ORDER — ACETAMINOPHEN 500 MG PO TABS
1000.0000 mg | ORAL_TABLET | Freq: Once | ORAL | Status: DC
  Filled 2019-10-30: qty 2

## 2019-10-30 MED ORDER — SODIUM CHLORIDE 0.9 % IV SOLN
INTRAVENOUS | Status: AC
  Filled 2019-10-30: qty 100

## 2019-10-30 MED ORDER — ONDANSETRON HCL 4 MG/2ML IJ SOLN
INTRAMUSCULAR | Status: DC | PRN
  Administered 2019-10-30: 4 mg via INTRAVENOUS

## 2019-10-30 SURGICAL SUPPLY — 28 items
BAG DRAIN URO-CYSTO SKYTR STRL (DRAIN) ×2 IMPLANT
BAG DRN UROCATH (DRAIN) ×1
BASKET STONE 1.7 NGAGE (UROLOGICAL SUPPLIES) IMPLANT
CATH INTERMIT  6FR 70CM (CATHETERS) IMPLANT
CLOTH BEACON ORANGE TIMEOUT ST (SAFETY) ×2 IMPLANT
DRSG TEGADERM 4X4.75 (GAUZE/BANDAGES/DRESSINGS) ×1 IMPLANT
EVACUATOR MICROVAS BLADDER (UROLOGICAL SUPPLIES) IMPLANT
EXTRACTOR STONE 1.7FRX115CM (UROLOGICAL SUPPLIES) IMPLANT
FIBER LASER FLEXIVA 1000 (UROLOGICAL SUPPLIES) IMPLANT
FIBER LASER FLEXIVA 365 (UROLOGICAL SUPPLIES) IMPLANT
FIBER LASER FLEXIVA 550 (UROLOGICAL SUPPLIES) IMPLANT
FIBER LASER TRAC TIP (UROLOGICAL SUPPLIES) IMPLANT
GAUZE SPONGE 4X4 3PLY NS LF (GAUZE/BANDAGES/DRESSINGS) ×1 IMPLANT
GLOVE BIO SURGEON STRL SZ8 (GLOVE) ×2 IMPLANT
GOWN STRL REUS W/TWL XL LVL3 (GOWN DISPOSABLE) ×2 IMPLANT
GUIDEWIRE STR DUAL SENSOR (WIRE) ×1 IMPLANT
GUIDEWIRE ZIPWRE .038 STRAIGHT (WIRE) ×2 IMPLANT
IV NS 1000ML (IV SOLUTION) ×2
IV NS 1000ML BAXH (IV SOLUTION) ×1 IMPLANT
IV NS IRRIG 3000ML ARTHROMATIC (IV SOLUTION) ×2 IMPLANT
KIT TURNOVER CYSTO (KITS) ×2 IMPLANT
MANIFOLD NEPTUNE II (INSTRUMENTS) ×2 IMPLANT
NS IRRIG 500ML POUR BTL (IV SOLUTION) ×2 IMPLANT
PACK CYSTO (CUSTOM PROCEDURE TRAY) ×2 IMPLANT
STENT URET 6FRX26 CONTOUR (STENTS) IMPLANT
SYR 10ML LL (SYRINGE) ×2 IMPLANT
TUBE CONNECTING 12X1/4 (SUCTIONS) IMPLANT
TUBING UROLOGY SET (TUBING) ×2 IMPLANT

## 2019-10-30 NOTE — Transfer of Care (Signed)
Immediate Anesthesia Transfer of Care Note  Patient: Rachel Nelson  Procedure(s) Performed: Procedure(s): diagnostic ureteroscopy, removal nephrostomy tube (Left) HOLMIUM LASER APPLICATION (Left)  Patient Location: PACU  Anesthesia Type:General  Level of Consciousness: Alert, Awake, Oriented  Airway & Oxygen Therapy: Patient Spontanous Breathing  Post-op Assessment: Report given to RN  Post vital signs: Reviewed and stable  Last Vitals:  Vitals:   10/30/19 0623 10/30/19 0815  BP: (!) 155/63 (!) 120/56  Pulse: 82 71  Resp: 14 13  Temp: 36.6 C (!) 36.4 C  SpO2: 99% 100%    Complications: No apparent anesthesia complications

## 2019-10-30 NOTE — H&P (Signed)
Urology Admission H&P  Chief Complaint: left ureteral calculus  History of Present Illness: Ms Gagliardo is a 69yo with a hx of nephrolithiasis who underwent nephrostomy tube placement for sepsis. She has failed to pass her calculus. She denies any flank pain. No LUTS. No fevers  Past Medical History:  Diagnosis Date  . Anemia   . History of acute pyelonephritis 03/2017  . History of septic shock    09-09-2019 due to UTI due to ureter stone obstruction/ hydronephrosis-- (blood culture ESBL Ecoli), intubated for ARF and extubated 09-13-2019,  pt discharge to home 09-20-2019  . Hydronephrosis, left    s/p  nephrostomy tube 09-10-2019  . Hyperlipidemia   . Type 2 diabetes mellitus (HCC)    followed by pcp   (11-11-2019  checks blood sugar's daily @ bedtime, not fasting)   Past Surgical History:  Procedure Laterality Date  . IR NEPHROSTOMY PLACEMENT LEFT  09/10/2019  . NO PAST SURGERIES      Home Medications:  Current Facility-Administered Medications  Medication Dose Route Frequency Provider Last Rate Last Admin  . 0.9 %  sodium chloride infusion   Intravenous Continuous Jairo Ben, MD 50 mL/hr at 10/30/19 0628 New Bag at 10/30/19 9622  . acetaminophen (TYLENOL) tablet 1,000 mg  1,000 mg Oral Once Marcene Duos, MD      . cefTRIAXone (ROCEPHIN) 2 g in sodium chloride 0.9 % 100 mL IVPB  2 g Intravenous 30 min Pre-Op Lanie Schelling, Mardene Celeste, MD       Allergies: No Known Allergies  History reviewed. No pertinent family history. Social History:  reports that she has never smoked. She has never used smokeless tobacco. She reports that she does not drink alcohol or use drugs.  Review of Systems  All other systems reviewed and are negative.   Physical Exam:  Vital signs in last 24 hours: Temp:  [97.9 F (36.6 C)] 97.9 F (36.6 C) (02/16 0623) Pulse Rate:  [82] 82 (02/16 0623) Resp:  [14] 14 (02/16 0623) BP: (155)/(63) 155/63 (02/16 0623) SpO2:  [99 %] 99 % (02/16  0623) Weight:  [54.6 kg] 54.6 kg (02/16 2979) Physical Exam  Constitutional: She is oriented to person, place, and time. She appears well-developed and well-nourished.  HENT:  Head: Normocephalic and atraumatic.  Eyes: Pupils are equal, round, and reactive to light. EOM are normal.  Neck: No thyromegaly present.  Cardiovascular: Normal rate and regular rhythm.  Respiratory: Effort normal. No respiratory distress.  GI: Soft. She exhibits no distension.  Musculoskeletal:        General: No edema. Normal range of motion.     Cervical back: Normal range of motion.  Neurological: She is alert and oriented to person, place, and time.  Skin: Skin is warm and dry.  Psychiatric: She has a normal mood and affect. Her behavior is normal. Judgment and thought content normal.    Laboratory Data:  Results for orders placed or performed during the hospital encounter of 10/30/19 (from the past 24 hour(s))  I-STAT, chem 8     Status: Abnormal   Collection Time: 10/30/19  6:18 AM  Result Value Ref Range   Sodium 138 135 - 145 mmol/L   Potassium 4.0 3.5 - 5.1 mmol/L   Chloride 104 98 - 111 mmol/L   BUN 17 8 - 23 mg/dL   Creatinine, Ser 8.92 0.44 - 1.00 mg/dL   Glucose, Bld 119 (H) 70 - 99 mg/dL   Calcium, Ion 4.17 4.08 - 1.40 mmol/L  TCO2 22 22 - 32 mmol/L   Hemoglobin 10.2 (L) 12.0 - 15.0 g/dL   HCT 30.0 (L) 36.0 - 46.0 %   Recent Results (from the past 240 hour(s))  SARS CORONAVIRUS 2 (TAT 6-24 HRS) Nasopharyngeal Nasopharyngeal Swab     Status: None   Collection Time: 10/26/19  8:38 AM   Specimen: Nasopharyngeal Swab  Result Value Ref Range Status   SARS Coronavirus 2 NEGATIVE NEGATIVE Final    Comment: (NOTE) SARS-CoV-2 target nucleic acids are NOT DETECTED. The SARS-CoV-2 RNA is generally detectable in upper and lower respiratory specimens during the acute phase of infection. Negative results do not preclude SARS-CoV-2 infection, do not rule out co-infections with other pathogens,  and should not be used as the sole basis for treatment or other patient management decisions. Negative results must be combined with clinical observations, patient history, and epidemiological information. The expected result is Negative. Fact Sheet for Patients: SugarRoll.be Fact Sheet for Healthcare Providers: https://www.woods-mathews.com/ This test is not yet approved or cleared by the [place] FDA and  has been authorized for detection and/or diagnosis of SARS-CoV-2 by FDA under an Emergency Use Authorization (EUA). This EUA will remain  in effect (meaning this test can be used) for the duration of the COVID-19 declaration under Section 56 4(b)(1) of the Act, 21 U.S.C. section 360bbb-3(b)(1), unless the authorization is terminated or revoked sooner. Performed at [facility], [place] [address] [place], [place] [address]    Creatinine: Recent Labs    10/30/19 9371  CREATININE 1.00   Baseline Creatinine: 1  Impression/Assessment:  68yo with left ureteral calculus  Plan:  The risks/benefits/alterantives to left ureteroscopic stone extraction was explained to the patient and she understands and wishes to proceed with surgery  Nicolette Bang 10/30/2019, 7:39 AM

## 2019-10-30 NOTE — Anesthesia Procedure Notes (Signed)
Procedure Name: LMA Insertion Date/Time: 10/30/2019 7:53 AM Performed by: Basilio Cairo, CRNA Pre-anesthesia Checklist: Patient identified, Patient being monitored, Timeout performed, Emergency Drugs available and Suction available Patient Re-evaluated:Patient Re-evaluated prior to induction Oxygen Delivery Method: Circle system utilized Preoxygenation: Pre-oxygenation with 100% oxygen Induction Type: IV induction Ventilation: Mask ventilation without difficulty LMA: LMA inserted LMA Size: 4.0 Tube type: Oral Number of attempts: 1 Placement Confirmation: positive ETCO2 and breath sounds checked- equal and bilateral Tube secured with: Tape Dental Injury: Teeth and Oropharynx as per pre-operative assessment

## 2019-10-30 NOTE — Discharge Instructions (Signed)
[facility] [phone] Post Ureteroscopy With or Without Stent Instructions DIET: You may return to your normal diet immediately. Because of the raw surface of your bladder, alcohol, spicy foods, acid type foods and drinks with caffeine may cause irritation or frequency and should be used in moderation. To keep your urine flowing freely and to avoid constipation, drink plenty of fluids during the day ( 8-10 glasses ). Tip: Avoid cranberry juice because it is very acidic.  ACTIVITY: Your physical activity doesn't need to be restricted. However, if you are very active, you may see some blood in your urine. We suggest that you reduce your activity under these circumstances until the bleeding has stopped.  BOWELS: It is important to keep your bowels regular during the postoperative period. Straining with bowel movements can cause bleeding. A bowel movement every other day is reasonable. Use a mild laxative if needed, such as Milk of Magnesia 2-3 tablespoons, or 2 Dulcolax tablets. Call if you continue to have problems. If you have been taking narcotics for pain, before, during or after your surgery, you may be constipated. Take a laxative if necessary.   MEDICATION: You should resume your pre-surgery medications unless told not to. In addition you will often be given an antibiotic to prevent infection. These should be taken as prescribed until the bottles are finished unless you are having an unusual reaction to one of the drugs.  PROBLEMS YOU SHOULD REPORT TO [place]:  Fevers over 100.5 Fahrenheit.  Heavy bleeding, or clots ( See above notes about blood in urine ).  Inability to urinate.  Drug reactions ( hives, rash, nausea, vomiting, diarrhea ).  Severe burning or pain with urination that is not improving.  FOLLOW-UP: You will need a follow-up appointment to monitor your progress. Call for this appointment at the number listed above. Usually the first appointment will be  about three to fourteen days after your surgery.  Post Anesthesia Home Care Instructions  Activity: Get plenty of rest for the remainder of the day. A responsible individual must stay with you for 24 hours following the procedure.  For the next 24 hours, DO NOT: -Drive a car -Advertising copywriter -Drink alcoholic beverages -Take any medication unless instructed by your physician -Make any legal decisions or sign important papers.  Meals: Start with liquid foods such as gelatin or soup. Progress to regular foods as tolerated. Avoid greasy, spicy, heavy foods. If nausea and/or vomiting occur, drink only clear liquids until the nausea and/or vomiting subsides. Call your physician if vomiting continues.  Special Instructions/Symptoms: Your throat may feel dry or sore from the anesthesia or the breathing tube placed in your throat during surgery. If this causes discomfort, gargle with warm salt water. The discomfort should disappear within 24 hours.    Ureteroscopy Ureteroscopy is a procedure to check for and treat problems inside part of the urinary tract. In this procedure, a thin, tube-shaped instrument with a light at the end (ureteroscope) is used to look at the inside of the kidneys and the ureters, which are the tubes that carry urine from the kidneys to the bladder. The ureteroscope is inserted into one or both of the ureters. You may need this procedure if you have frequent urinary tract infections (UTIs), blood in your urine, or a stone in one of your ureters. A ureteroscopy can be done to find the cause of urine blockage in a ureter and to evaluate other abnormalities inside the ureters or kidneys. If stones are found, they  can be removed during the procedure. Polyps, abnormal tissue, and some types of tumors can also be removed or treated. The ureteroscope may also have a tool to remove tissue to be checked for disease under a microscope (biopsy). Tell a health care provider about:  Any  allergies you have.  All medicines you are taking, including vitamins, herbs, eye drops, creams, and over-the-counter medicines.  Any problems you or family members have had with anesthetic medicines.  Any blood disorders you have.  Any surgeries you have had.  Any medical conditions you have.  Whether you are pregnant or may be pregnant. What are the risks? Generally, this is a safe procedure. However, problems may occur, including:  Bleeding.  Infection.  Allergic reactions to medicines.  Scarring that narrows the ureter (stricture).  Creating a hole in the ureter (perforation). What happens before the procedure? Staying hydrated Follow instructions from your health care provider about hydration, which may include:  Up to 2 hours before the procedure - you may continue to drink clear liquids, such as water, clear fruit juice, black coffee, and plain tea. Eating and drinking restrictions Follow instructions from your health care provider about eating and drinking, which may include:  8 hours before the procedure - stop eating heavy meals or foods such as meat, fried foods, or fatty foods.  6 hours before the procedure - stop eating light meals or foods, such as toast or cereal.  6 hours before the procedure - stop drinking milk or drinks that contain milk.  2 hours before the procedure - stop drinking clear liquids. Medicines  Ask your health care provider about: ? Changing or stopping your regular medicines. This is especially important if you are taking diabetes medicines or blood thinners. ? Taking medicines such as aspirin and ibuprofen. These medicines can thin your blood. Do not take these medicines before your procedure if your health care provider instructs you not to.  You may be given antibiotic medicine to help prevent infection. General instructions  You may have a urine sample taken to check for infection.  Plan to have someone take you home from the  hospital or clinic. What happens during the procedure?   To reduce your risk of infection: ? Your health care team will wash or sanitize their hands. ? Your skin will be washed with soap.  An IV tube will be inserted into one of your veins.  You will be given one of the following: ? A medicine to help you relax (sedative). ? A medicine to make you fall asleep (general anesthetic). ? A medicine that is injected into your spine to numb the area below and slightly above the injection site (spinal anesthetic).  To lower your risk of infection, you may be given an antibiotic medicine by an injection or through the IV tube.  The opening from which you urinate (urethra) will be cleaned with a germ-killing solution.  The ureteroscope will be passed through your urethra into your bladder.  A salt-water solution will flow through the ureteroscope to fill your bladder. This will help the health care provider see the openings of your ureters more clearly.  Then, the ureteroscope will be passed into your ureter. ? If a growth is found, a piece of it may be removed so it can be examined under a microscope (biopsy). ? If a stone is found, it may be removed through the ureteroscope, or the stone may be broken up using a laser, shock waves, or electrical  energy. ? In some cases, if the ureter is too small, a tube may be inserted that keeps the ureter open (ureteral stent). The stent may be left in place for 1 or 2 weeks to keep the ureter open, and then the ureteroscopy procedure will be performed.  The scope will be removed, and your bladder will be emptied. The procedure may vary among health care providers and hospitals. What happens after the procedure?  Your blood pressure, heart rate, breathing rate, and blood oxygen level will be monitored until the medicines you were given have worn off.  You may be asked to urinate.  Donot drive for 24 hours if you were given a sedative. This  information is not intended to replace advice given to you by your health care provider. Make sure you discuss any questions you have with your health care provider. Document Revised: 08/12/2017 Document Reviewed: 06/11/2016 Elsevier Patient Education  2020 [employer].

## 2019-10-30 NOTE — Anesthesia Postprocedure Evaluation (Signed)
Anesthesia Post Note  Patient: Rachel Nelson  Procedure(s) Performed: diagnostic ureteroscopy, removal nephrostomy tube (Left Ureter) HOLMIUM LASER APPLICATION (Left Ureter)     Patient location during evaluation: PACU Anesthesia Type: General Level of consciousness: awake and alert Pain management: pain level controlled Vital Signs Assessment: post-procedure vital signs reviewed and stable Respiratory status: spontaneous breathing, nonlabored ventilation, respiratory function stable and patient connected to nasal cannula oxygen Cardiovascular status: blood pressure returned to baseline and stable Postop Assessment: no apparent nausea or vomiting Anesthetic complications: no    Last Vitals:  Vitals:   10/30/19 0846 10/30/19 0930  BP: (!) 133/57 (!) 139/58  Pulse:  65  Resp:  16  Temp:  (!) 36.4 C  SpO2:  100%    Last Pain:  Vitals:   10/30/19 0930  TempSrc: Oral  PainSc: 0-No pain                 Kennieth Rad

## 2019-10-30 NOTE — Op Note (Signed)
Preoperative diagnosis: Left ureteral calculus  Postoperative diagnosis: no calculus identified  Procedure: 1 cystoscopy 2.  left retrograde pyelography 3.  Intraoperative fluoroscopy, under one hour, with interpretation 4.  Left diagnostic ureteroscopy 5. Left nephrostomy tube removal  Attending: Wilkie Aye, MD  Anesthesia: General  Estimated blood loss: None  Drains: none  Specimens: none  Antibiotics: rocephin  Findings: The masses/lesions in the bladder. Ureteral orifices in normal anatomic location. No left hydronephrosis. No tumor/calculus visualized on ureteroscopy.  Indications: Patient is a 69 year old female with a history of a left ureteral calculus and sepsis who had a left nephrostomy tube placed..  After discussing treatment options, she decided proceed with left ureteroscopy  Procedure her in detail: The patient was brought to the operating room and a brief timeout was done to ensure correct patient, correct procedure, correct site.  General anesthesia was administered patient was placed in dorsal lithotomy position.  Her genitalia was then prepped and draped in usual sterile fashion.  A rigid 22 French cystoscope was passed in the urethra and the bladder.  Bladder was inspected free masses or lesions.  the ureteral orifices were in the normal orthotopic locations.  a 6 french ureteral catheter was then instilled into the left ureter orifice.  a gentle retrograde was obtained and findings noted above.  we then placed a zip wire through the ureteral catheter and advanced up to the renal pelvis.  we then removed the cystoscope and cannulated the right ureteral orifice with a semirigid ureteroscope.  we then performed ureteroscopy up to the level of the UPJ. No stone or tumor was encountered.  We removed the scope and wire. the bladder was then drained and the left nephrostomy tube was removed. This concluded the procedure which was well tolerated by  patient.  Complications: None  Condition: Stable, extubated, transferred to PACU  Plan: Pt is to followup in 2 weeks

## 2019-11-08 DIAGNOSIS — N390 Urinary tract infection, site not specified: Secondary | ICD-10-CM | POA: Diagnosis not present

## 2019-11-08 DIAGNOSIS — B9620 Unspecified Escherichia coli [E. coli] as the cause of diseases classified elsewhere: Secondary | ICD-10-CM | POA: Diagnosis not present

## 2019-11-08 DIAGNOSIS — N201 Calculus of ureter: Secondary | ICD-10-CM | POA: Diagnosis not present

## 2019-11-15 ENCOUNTER — Ambulatory Visit: Payer: BC Managed Care – PPO | Attending: Internal Medicine

## 2019-11-15 DIAGNOSIS — Z23 Encounter for immunization: Secondary | ICD-10-CM

## 2019-11-15 NOTE — Progress Notes (Signed)
   Covid-19 Vaccination Clinic  Name:  Rachel Nelson    MRN: [id number] DOB: [birth year]  11/15/2019  [name] was observed post Covid-19 immunization for 15 minutes without incident. She was provided with Vaccine Information Sheet and instruction to access the V-Safe system.   [name] was instructed to call 911 with any severe reactions post vaccine: Marland Kitchen Difficulty breathing  . Swelling of face and throat  . A fast heartbeat  . A bad rash all over body  . Dizziness and weakness   Immunizations Administered    Name Date Dose VIS Date Route   Pfizer COVID-19 Vaccine 11/15/2019 12:15 PM 0.3 mL 08/24/2019 Intramuscular   Manufacturer: [employer], [employer]   Lot: [id number]   NDC: [address]-6

## 2019-12-06 LAB — BLOOD GAS, ARTERIAL
Acid-Base Excess: 6.8 mmol/L — ABNORMAL HIGH (ref 0.0–2.0)
Bicarbonate: 30 mmol/L — ABNORMAL HIGH (ref 20.0–28.0)
O2 Saturation: 95.6 %
Patient temperature: 99.1
pCO2 arterial: 37.9 mmHg (ref 32.0–48.0)
pH, Arterial: 7.51 — ABNORMAL HIGH (ref 7.350–7.450)
pO2, Arterial: 71.1 mmHg — ABNORMAL LOW (ref 83.0–108.0)

## 2019-12-11 ENCOUNTER — Ambulatory Visit: Payer: BC Managed Care – PPO | Attending: Internal Medicine

## 2019-12-11 DIAGNOSIS — Z23 Encounter for immunization: Secondary | ICD-10-CM

## 2019-12-11 NOTE — Progress Notes (Signed)
   Covid-19 Vaccination Clinic  Name:  Rachel Nelson    MRN: [id number] DOB: 26-Aug-1951  12/11/2019  Rachel Nelson was observed post Covid-19 immunization for 15 minutes without incident. She was provided with Vaccine Information Sheet and instruction to access the V-Safe system.   Rachel Nelson was instructed to call 911 with any severe reactions post vaccine: Marland Kitchen Difficulty breathing  . Swelling of face and throat  . A fast heartbeat  . A bad rash all over body  . Dizziness and weakness   Immunizations Administered    Name Date Dose VIS Date Route   Pfizer COVID-19 Vaccine 12/11/2019  3:17 PM 0.3 mL 08/24/2019 Intramuscular   Manufacturer: [employer], [employer]   Lot: [id number]   NDC: [address]-0

## 2020-01-15 DIAGNOSIS — E1165 Type 2 diabetes mellitus with hyperglycemia: Secondary | ICD-10-CM | POA: Diagnosis not present

## 2020-01-15 DIAGNOSIS — E781 Pure hyperglyceridemia: Secondary | ICD-10-CM | POA: Diagnosis not present

## 2020-01-22 DIAGNOSIS — E782 Mixed hyperlipidemia: Secondary | ICD-10-CM | POA: Diagnosis not present

## 2020-01-22 DIAGNOSIS — E1165 Type 2 diabetes mellitus with hyperglycemia: Secondary | ICD-10-CM | POA: Diagnosis not present

## 2020-01-22 DIAGNOSIS — N1831 Chronic kidney disease, stage 3a: Secondary | ICD-10-CM | POA: Diagnosis not present

## 2020-01-22 DIAGNOSIS — R030 Elevated blood-pressure reading, without diagnosis of hypertension: Secondary | ICD-10-CM | POA: Diagnosis not present

## 2020-02-05 DIAGNOSIS — N201 Calculus of ureter: Secondary | ICD-10-CM | POA: Diagnosis not present

## 2020-02-26 DIAGNOSIS — Z20822 Contact with and (suspected) exposure to covid-19: Secondary | ICD-10-CM | POA: Diagnosis not present

## 2020-02-27 DIAGNOSIS — B974 Respiratory syncytial virus as the cause of diseases classified elsewhere: Secondary | ICD-10-CM | POA: Diagnosis not present

## 2020-02-27 DIAGNOSIS — U071 COVID-19: Secondary | ICD-10-CM | POA: Diagnosis not present

## 2020-02-27 DIAGNOSIS — Z20828 Contact with and (suspected) exposure to other viral communicable diseases: Secondary | ICD-10-CM | POA: Diagnosis not present

## 2020-02-27 DIAGNOSIS — J101 Influenza due to other identified influenza virus with other respiratory manifestations: Secondary | ICD-10-CM | POA: Diagnosis not present

## 2020-04-19 DIAGNOSIS — Z20822 Contact with and (suspected) exposure to covid-19: Secondary | ICD-10-CM | POA: Diagnosis not present

## 2020-05-29 IMAGING — DX DG CHEST 1V PORT
1 series · 1 of 1 positions shown · non-contrast
Comparison: Radiograph yesterday.

CLINICAL DATA: Respiratory failure. Endotracheal tube position.

EXAM:
PORTABLE CHEST 1 VIEW

[chest ap]
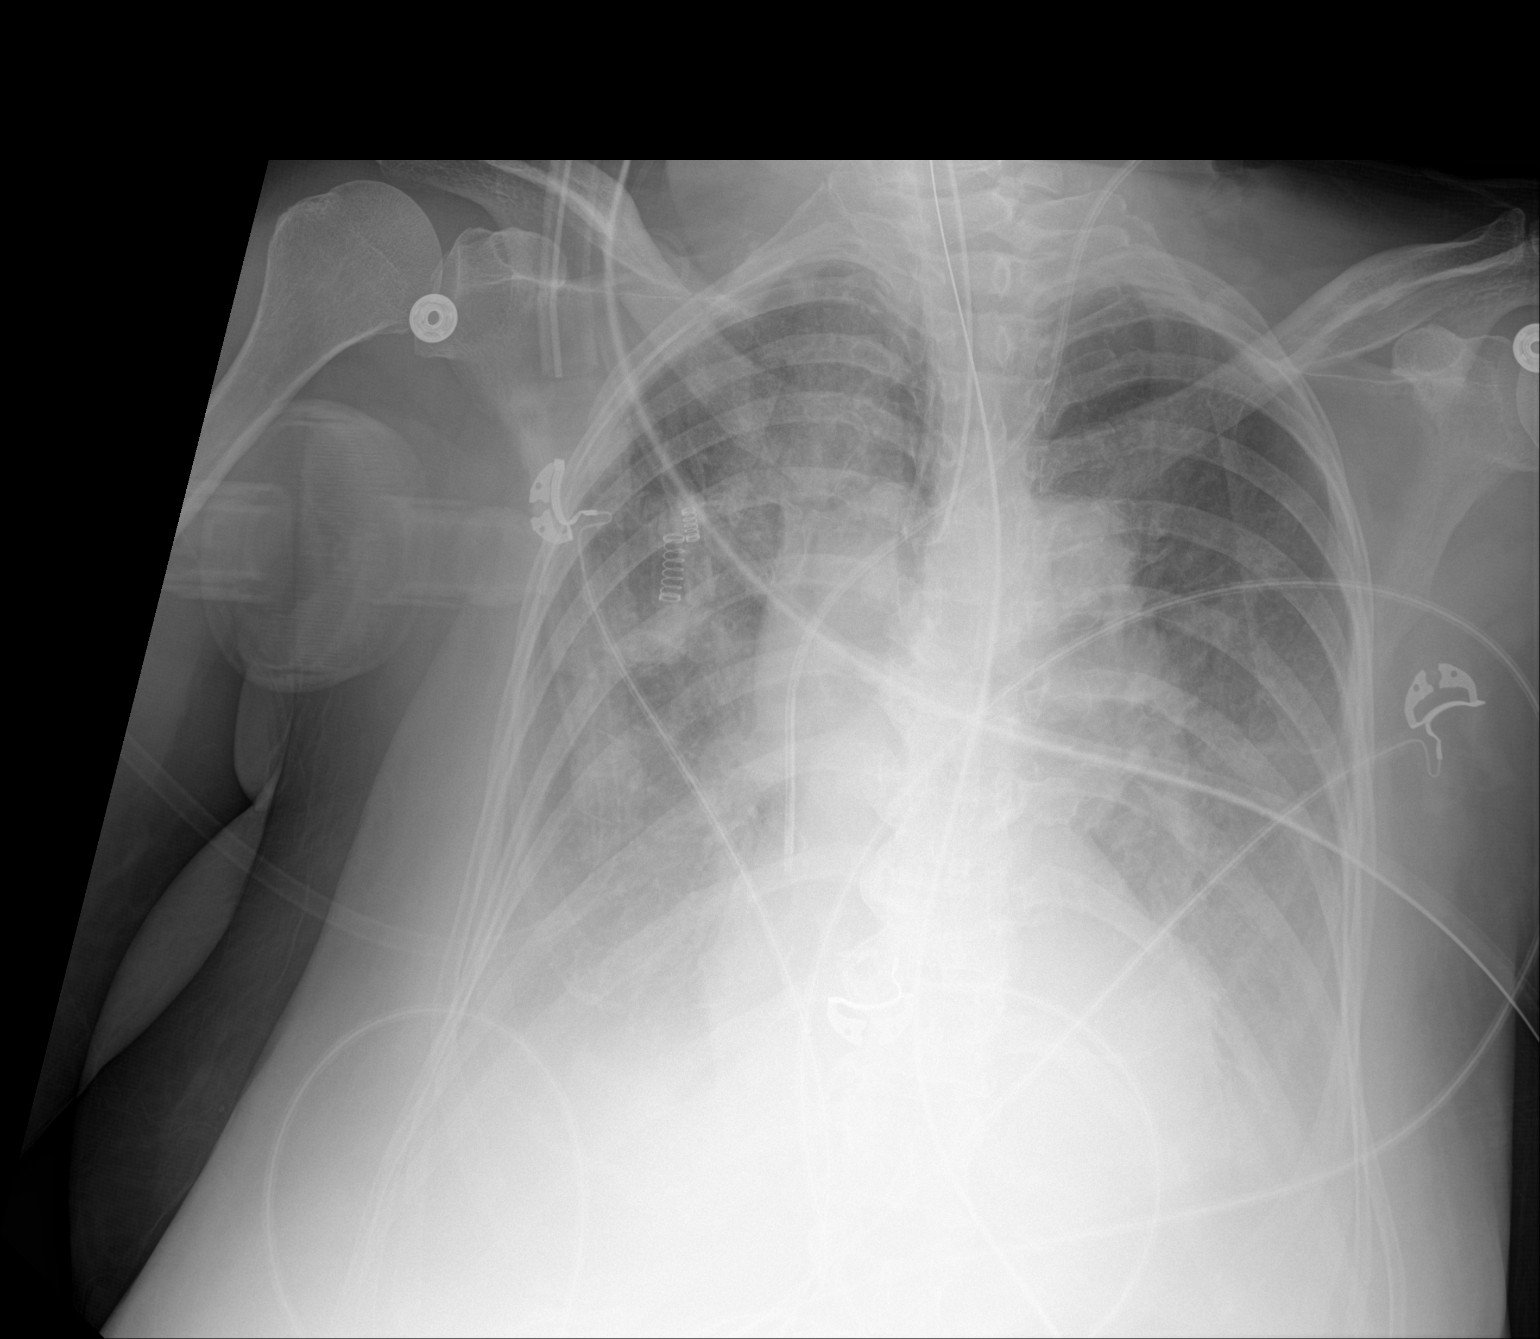

[1 of 1 positions shown; findings below may reference images not displayed]

FINDINGS: Endotracheal tube tip at the thoracic inlet. Enteric tube in place
with tip below the diaphragm not included in the field of view. Left
internal jugular central line tip in the lower SVC.

Hazy opacities in the mid lower lung zones likely combination of
pleural fluid and airspace disease/atelectasis. Unchanged heart size
and mediastinal contours. Overall no significant change from prior
exam.
IMPRESSION: 1. Unchanged appearance of the chest. Hazy opacities in the mid and
lower lung zones, likely combination of pleural fluid and airspace
disease/atelectasis.
2. Stable support apparatus.

## 2020-06-01 IMAGING — DX DG CHEST 1V PORT
1 series · 1 of 1 positions shown · non-contrast
Comparison: 09/15/2019

CLINICAL DATA: 68-year-old with left flank pain, chills and
malaise.

EXAM:
PORTABLE CHEST 1 VIEW

[chest ap]
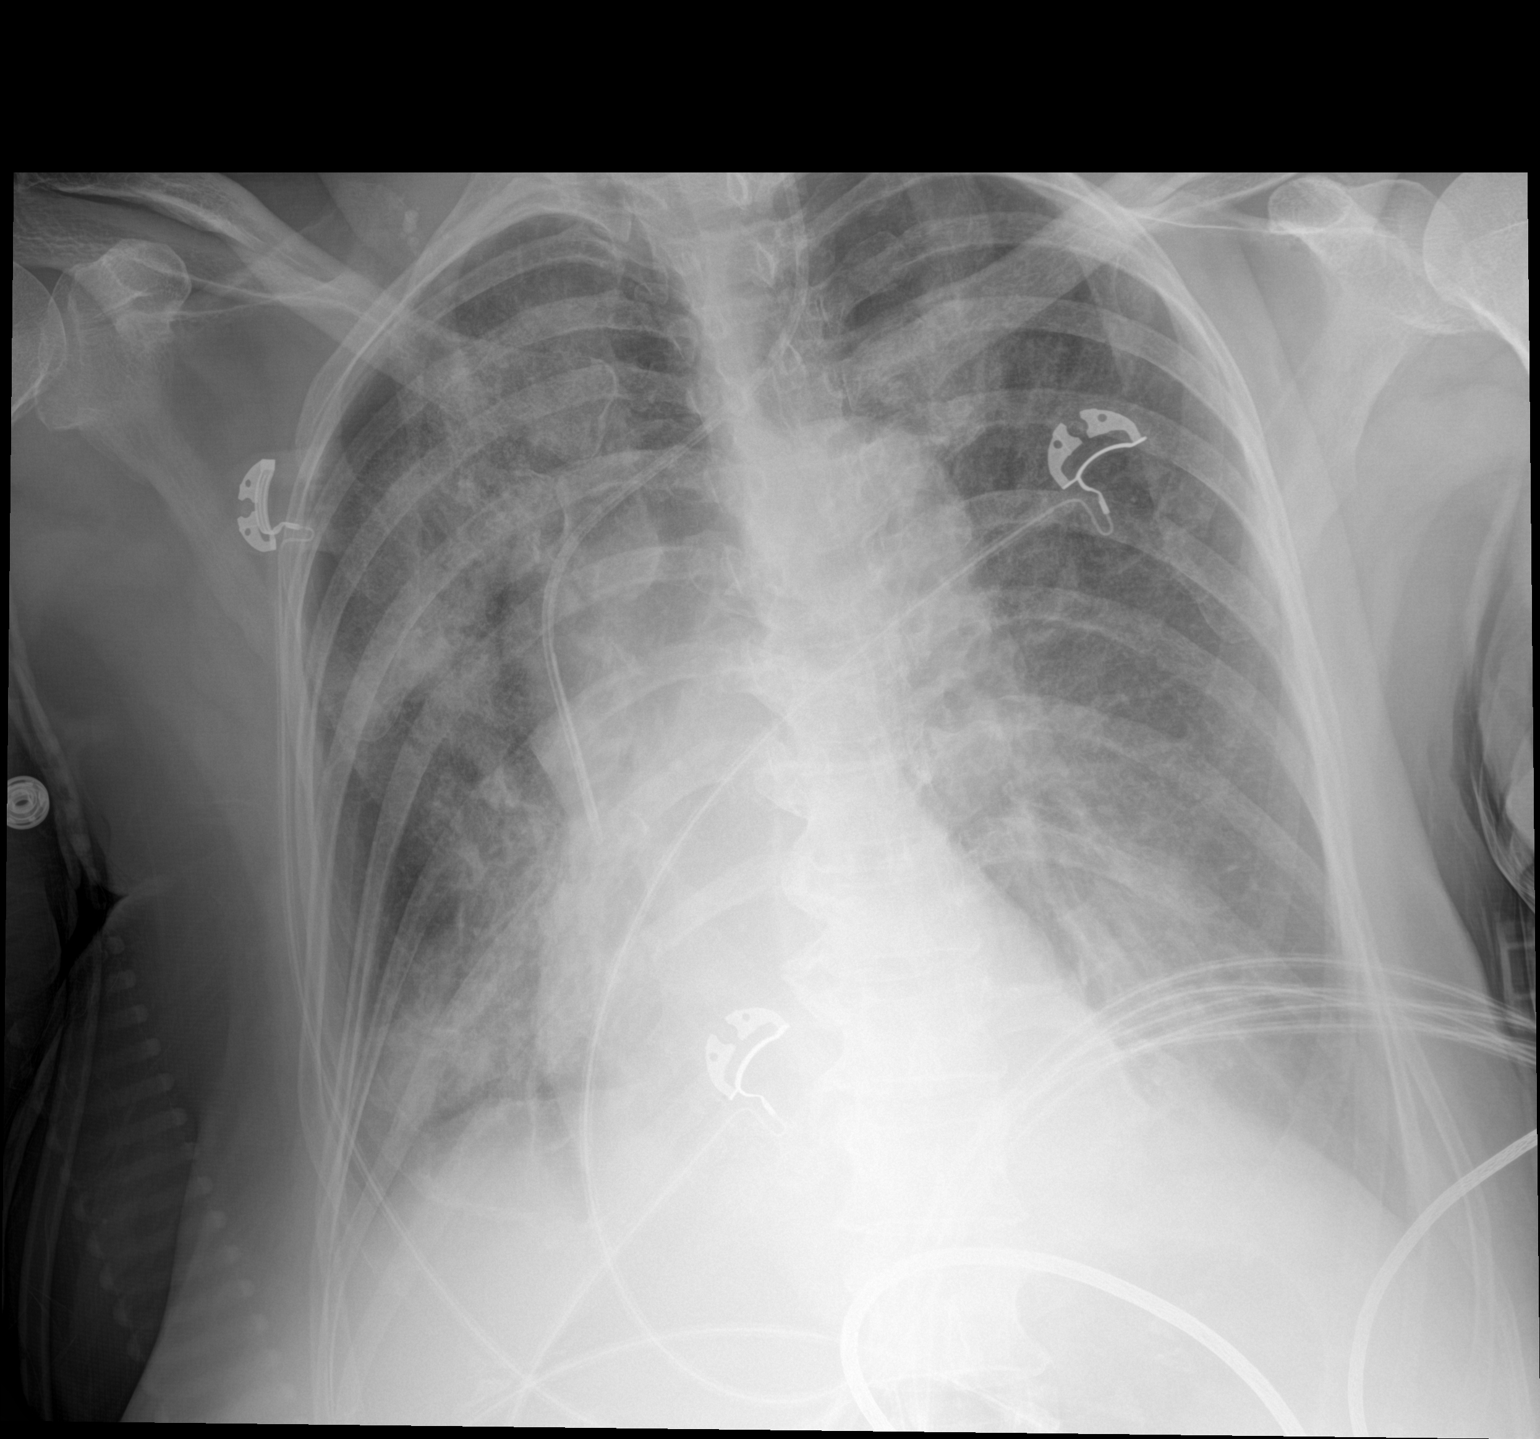

[1 of 1 positions shown; findings below may reference images not displayed]

FINDINGS: Normal heart size. Small pleural effusions identified. Diffuse
bilateral interstitial and airspace densities are identified,
unchanged from previous exam.
IMPRESSION: 1. No change in aeration to the lungs compared with previous exam.
2. Stable small bilateral pleural effusions.

## 2020-06-02 IMAGING — DX DG CHEST 1V PORT
1 series · 1 of 1 positions shown · non-contrast
Comparison: Radiograph yesterday.

CLINICAL DATA: Shortness of breath.

EXAM:
PORTABLE CHEST 1 VIEW

[chest ap]
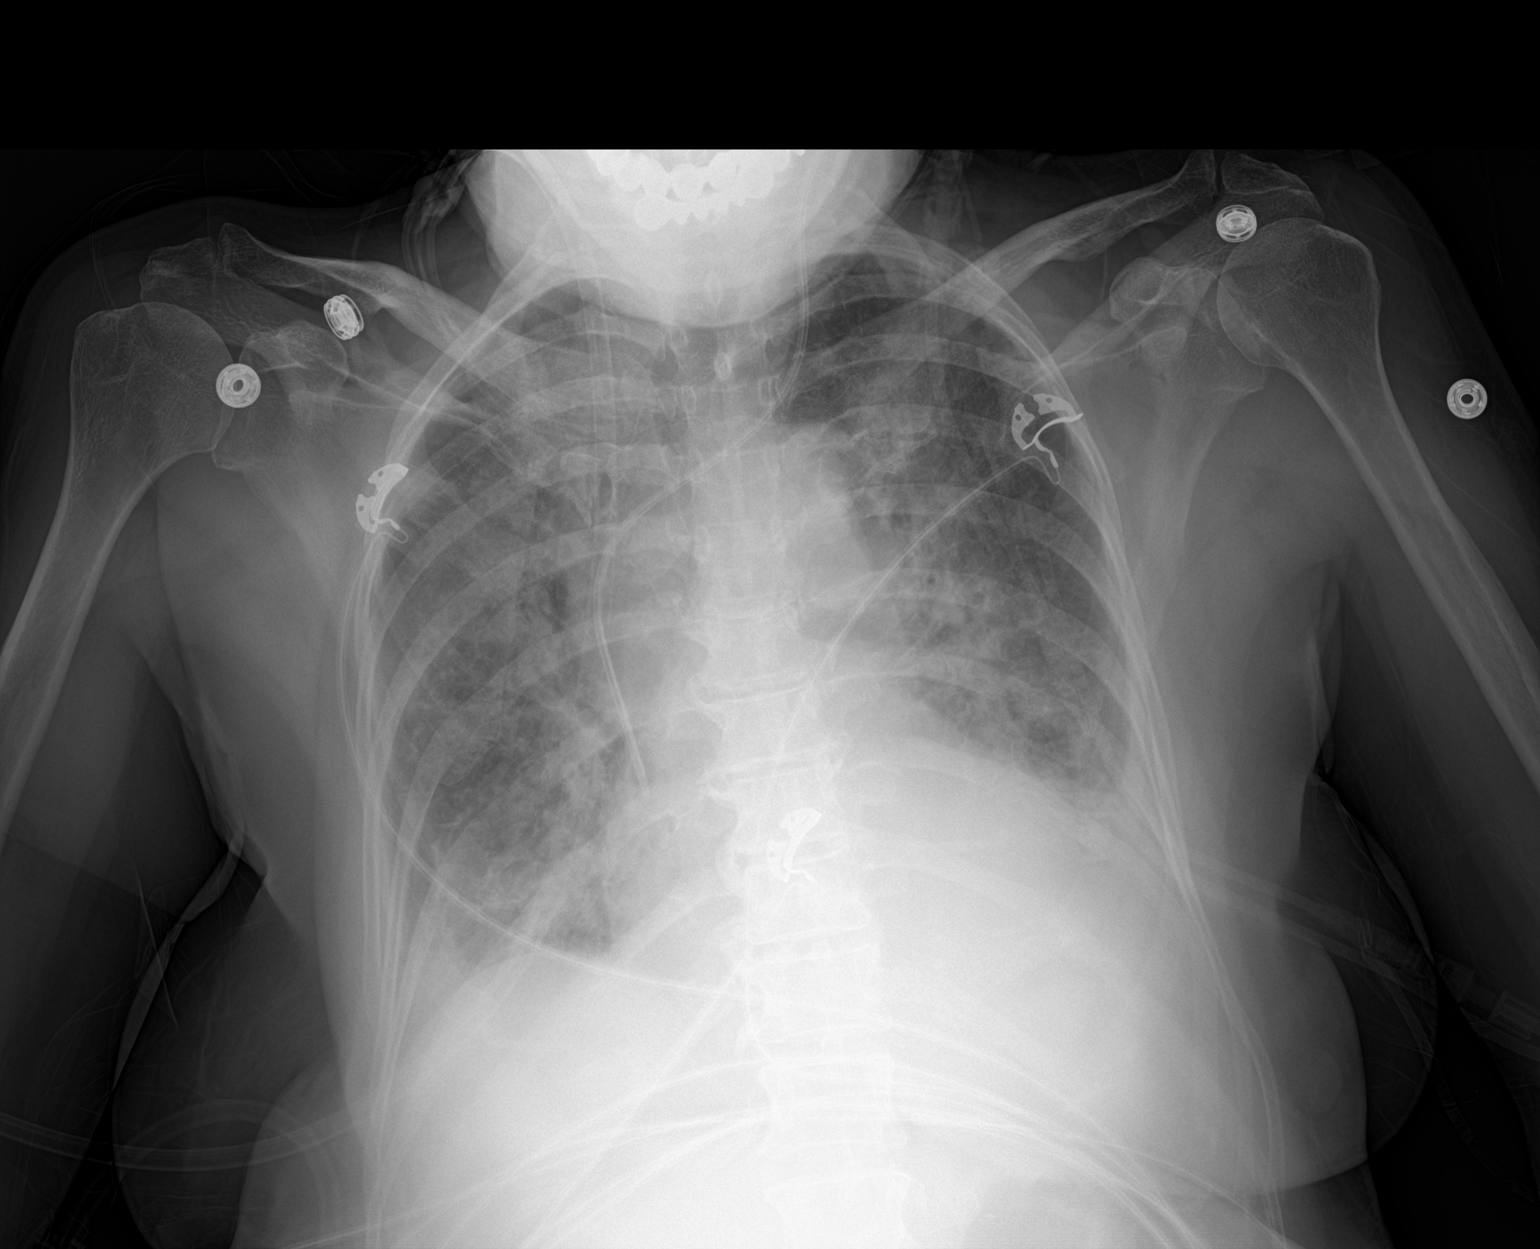

[1 of 1 positions shown; findings below may reference images not displayed]

FINDINGS: Left central line remains in place. Slightly lower lung volumes.
Hazy lung base opacities consistent with pleural effusions and
adjacent atelectasis. Diffuse interstitial and airspace opacities
are otherwise unchanged, likely pulmonary edema. Unchanged heart
size and mediastinal contours. No pneumothorax.
IMPRESSION: 1. Slightly lower lung volumes since yesterday.
2. Otherwise unchanged pleural effusions and interstitial opacities
suspicious for pulmonary edema.

## 2020-06-03 DIAGNOSIS — N1831 Chronic kidney disease, stage 3a: Secondary | ICD-10-CM | POA: Diagnosis not present

## 2020-06-03 DIAGNOSIS — E1165 Type 2 diabetes mellitus with hyperglycemia: Secondary | ICD-10-CM | POA: Diagnosis not present

## 2020-06-03 DIAGNOSIS — R030 Elevated blood-pressure reading, without diagnosis of hypertension: Secondary | ICD-10-CM | POA: Diagnosis not present

## 2020-06-03 DIAGNOSIS — E875 Hyperkalemia: Secondary | ICD-10-CM | POA: Diagnosis not present

## 2020-06-10 DIAGNOSIS — E875 Hyperkalemia: Secondary | ICD-10-CM | POA: Diagnosis not present

## 2020-06-10 DIAGNOSIS — I10 Essential (primary) hypertension: Secondary | ICD-10-CM | POA: Diagnosis not present

## 2020-06-10 DIAGNOSIS — Z23 Encounter for immunization: Secondary | ICD-10-CM | POA: Diagnosis not present

## 2020-06-10 DIAGNOSIS — E1165 Type 2 diabetes mellitus with hyperglycemia: Secondary | ICD-10-CM | POA: Diagnosis not present

## 2020-06-10 DIAGNOSIS — N1831 Chronic kidney disease, stage 3a: Secondary | ICD-10-CM | POA: Diagnosis not present

## 2020-06-13 DIAGNOSIS — R1084 Generalized abdominal pain: Secondary | ICD-10-CM | POA: Diagnosis not present

## 2020-06-13 DIAGNOSIS — R1032 Left lower quadrant pain: Secondary | ICD-10-CM | POA: Diagnosis not present

## 2020-08-13 DIAGNOSIS — E1165 Type 2 diabetes mellitus with hyperglycemia: Secondary | ICD-10-CM | POA: Diagnosis not present

## 2020-08-13 DIAGNOSIS — E875 Hyperkalemia: Secondary | ICD-10-CM | POA: Diagnosis not present

## 2020-08-13 DIAGNOSIS — N1831 Chronic kidney disease, stage 3a: Secondary | ICD-10-CM | POA: Diagnosis not present

## 2020-08-13 DIAGNOSIS — I10 Essential (primary) hypertension: Secondary | ICD-10-CM | POA: Diagnosis not present

## 2020-08-18 DIAGNOSIS — E1165 Type 2 diabetes mellitus with hyperglycemia: Secondary | ICD-10-CM | POA: Diagnosis not present

## 2020-08-18 DIAGNOSIS — I10 Essential (primary) hypertension: Secondary | ICD-10-CM | POA: Diagnosis not present

## 2020-08-18 DIAGNOSIS — E782 Mixed hyperlipidemia: Secondary | ICD-10-CM | POA: Diagnosis not present

## 2022-07-05 DIAGNOSIS — E782 Mixed hyperlipidemia: Secondary | ICD-10-CM | POA: Diagnosis not present

## 2022-07-05 DIAGNOSIS — R1031 Right lower quadrant pain: Secondary | ICD-10-CM | POA: Diagnosis not present

## 2022-07-05 DIAGNOSIS — E1165 Type 2 diabetes mellitus with hyperglycemia: Secondary | ICD-10-CM | POA: Diagnosis not present

## 2022-07-05 DIAGNOSIS — I10 Essential (primary) hypertension: Secondary | ICD-10-CM | POA: Diagnosis not present

## 2022-07-06 ENCOUNTER — Other Ambulatory Visit: Payer: Self-pay

## 2022-07-06 DIAGNOSIS — R1031 Right lower quadrant pain: Secondary | ICD-10-CM

## 2022-07-08 DIAGNOSIS — N3020 Other chronic cystitis without hematuria: Secondary | ICD-10-CM | POA: Diagnosis not present

## 2022-07-08 DIAGNOSIS — R8271 Bacteriuria: Secondary | ICD-10-CM | POA: Diagnosis not present

## 2022-07-08 DIAGNOSIS — R1031 Right lower quadrant pain: Secondary | ICD-10-CM | POA: Diagnosis not present

## 2022-07-13 ENCOUNTER — Ambulatory Visit
Admission: RE | Admit: 2022-07-13 | Discharge: 2022-07-13 | Disposition: A | Discharge status: Home / Self Care | Payer: 59 | Source: Ambulatory Visit

## 2022-07-13 ENCOUNTER — Other Ambulatory Visit: Payer: Self-pay

## 2022-07-13 ENCOUNTER — Emergency Department (HOSPITAL_COMMUNITY)
Admission: EM | Admit: 2022-07-13 | Discharge: 2022-07-13 | Disposition: A | Discharge status: Home / Self Care | Payer: 59 | Attending: Emergency Medicine | Admitting: Emergency Medicine

## 2022-07-13 ENCOUNTER — Encounter (HOSPITAL_COMMUNITY): Payer: Self-pay

## 2022-07-13 DIAGNOSIS — R1031 Right lower quadrant pain: Secondary | ICD-10-CM

## 2022-07-13 DIAGNOSIS — K3580 Unspecified acute appendicitis: Secondary | ICD-10-CM | POA: Diagnosis not present

## 2022-07-13 DIAGNOSIS — Z7982 Long term (current) use of aspirin: Secondary | ICD-10-CM | POA: Diagnosis not present

## 2022-07-13 DIAGNOSIS — D72829 Elevated white blood cell count, unspecified: Secondary | ICD-10-CM | POA: Diagnosis not present

## 2022-07-13 DIAGNOSIS — K36 Other appendicitis: Secondary | ICD-10-CM | POA: Insufficient documentation

## 2022-07-13 DIAGNOSIS — R1030 Lower abdominal pain, unspecified: Secondary | ICD-10-CM | POA: Diagnosis not present

## 2022-07-13 HISTORY — DX: Essential (primary) hypertension: I10

## 2022-07-13 LAB — COMPREHENSIVE METABOLIC PANEL
ALT: 10 U/L (ref 0–44)
AST: 18 U/L (ref 15–41)
Albumin: 3.9 g/dL (ref 3.5–5.0)
Alkaline Phosphatase: 64 U/L (ref 38–126)
Anion gap: 10 (ref 5–15)
BUN: 32 mg/dL — ABNORMAL HIGH (ref 8–23)
CO2: 17 mmol/L — ABNORMAL LOW (ref 22–32)
Calcium: 8.9 mg/dL (ref 8.9–10.3)
Chloride: 104 mmol/L (ref 98–111)
Creatinine, Ser: 1.18 mg/dL — ABNORMAL HIGH (ref 0.44–1.00)
GFR, Estimated: 49 mL/min — ABNORMAL LOW (ref >60)
Glucose, Bld: 108 mg/dL — ABNORMAL HIGH (ref 70–99)
Potassium: 5.1 mmol/L (ref 3.5–5.1)
Sodium: 131 mmol/L — ABNORMAL LOW (ref 135–145)
Total Bilirubin: 0.2 mg/dL — ABNORMAL LOW (ref 0.3–1.2)
Total Protein: 7.8 g/dL (ref 6.5–8.1)

## 2022-07-13 LAB — CBC
HCT: 26.7 % — ABNORMAL LOW (ref 36.0–46.0)
Hemoglobin: 8.6 g/dL — ABNORMAL LOW (ref 12.0–15.0)
MCH: 26.6 pg (ref 26.0–34.0)
MCHC: 32.2 g/dL (ref 30.0–36.0)
MCV: 82.7 fL (ref 80.0–100.0)
Platelets: 474 10*3/uL — ABNORMAL HIGH (ref 150–400)
RBC: 3.23 MIL/uL — ABNORMAL LOW (ref 3.87–5.11)
RDW: 13.3 % (ref 11.5–15.5)
WBC: 12 10*3/uL — ABNORMAL HIGH (ref 4.0–10.5)
nRBC: 0 % (ref 0.0–0.2)

## 2022-07-13 LAB — LACTIC ACID, PLASMA
Lactic Acid, Venous: 2.9 mmol/L (ref 0.5–1.9)
Lactic Acid, Venous: 2.2 mmol/L (ref 0.5–1.9)

## 2022-07-13 LAB — LIPASE, BLOOD: Lipase: 188 U/L — ABNORMAL HIGH (ref 11–51)

## 2022-07-13 MED ORDER — SODIUM CHLORIDE 0.9 % IV SOLN
2.0000 g | Freq: Once | INTRAVENOUS | Status: AC
  Administered 2022-07-13: 2 g via INTRAVENOUS
  Filled 2022-07-13: qty 20

## 2022-07-13 MED ORDER — LACTATED RINGERS IV SOLN: INTRAVENOUS | Status: DC

## 2022-07-13 MED ORDER — METRONIDAZOLE 500 MG/100ML IV SOLN
500.0000 mg | Freq: Once | INTRAVENOUS | Status: AC
  Administered 2022-07-13: 500 mg via INTRAVENOUS
  Filled 2022-07-13: qty 100

## 2022-07-13 MED ORDER — AMOXICILLIN-POT CLAVULANATE 875-125 MG PO TABS: 1.0000 | ORAL_TABLET | Freq: Two times a day (BID) | ORAL | 0 refills | Status: AC

## 2022-07-13 NOTE — ED Notes (Signed)
Patient verbalizes understanding of discharge instructions. Opportunity for questioning and answers were provided. Armband removed by staff, pt discharged from ED. Ambulated out with son

## 2022-07-13 NOTE — Discharge Instructions (Signed)
1.  You have had IV antibiotics and fluid rehydration in the emergency department.  Fill your prescription for Augmentin and start taking it tomorrow. 2.  See your family doctor for recheck within the next 2 to 4 days. 3.  Return to the emergency department immediately if you get recurrence of pain, vomiting, fever or other concerning changes. 4.  Your lipase was elevated.  This needs to be monitored and further evaluated on an outpatient basis.  You are also due for colonoscopies.  Reviewed this with your doctor.

## 2022-07-13 NOTE — Consult Note (Signed)
Consulting Physician: Hyman Hopes Jones Viviani  Referring Provider: Dr. Donnald Garre - ER Provider  Chief Complaint: Abdominal Pain  Reason for Consult: Appendicitis   Subjective   HPI: Rachel Nelson is an 71 y.o. female who is here for abdominal pain.  She says she had pain about a week ago and saw her primary care physician.  Primary care doctor started her on antibiotics and she felt better.  The primary care doctor also had ordered a CT scan, and the patient did not feel like getting it until the primary care doctor insisted.  She was cooking dinner today when the results came back and said that she had appendicitis.  She was surprised and was directed to the emergency room for evaluation.  At this point she denies any abdominal pain.  She has about 4 days left on her antibiotic.  Past Medical History:  Diagnosis Date   Anemia    History of acute pyelonephritis 03/2017   History of septic shock    09-09-2019 due to UTI due to ureter stone obstruction/ hydronephrosis-- (blood culture ESBL Ecoli), intubated for ARF and extubated 09-13-2019,  pt discharge to home 09-20-2019   Hydronephrosis, left    s/p  nephrostomy tube 09-10-2019   Hyperlipidemia    Hypertension    Type 2 diabetes mellitus (HCC)    followed by pcp   (11-11-2019  checks blood sugar's daily @ bedtime, not fasting)    Past Surgical History:  Procedure Laterality Date   CYSTOSCOPY WITH RETROGRADE PYELOGRAM, URETEROSCOPY AND STENT PLACEMENT Left 10/30/2019   Procedure: diagnostic ureteroscopy, removal nephrostomy tube;  Surgeon: Malen Gauze, MD;  Location: [facility];  Service: Urology;  Laterality: Left;   HOLMIUM LASER APPLICATION Left 10/30/2019   Procedure: HOLMIUM LASER APPLICATION;  Surgeon: Malen Gauze, MD;  Location: [facility];  Service: Urology;  Laterality: Left;   IR NEPHROSTOMY PLACEMENT LEFT  09/10/2019   NO PAST SURGERIES      History reviewed. No pertinent  family history.  Social:  reports that she has never smoked. She has never used smokeless tobacco. She reports that she does not drink alcohol and does not use drugs.  Allergies: No Known Allergies  Medications: Current Outpatient Medications  Medication Instructions   acetaminophen (TYLENOL) 325 mg, Oral, Every 6 hours PRN   aspirin EC 81 mg, Oral, Daily   atorvastatin (LIPITOR) 20 mg, Oral, Daily   HYDROcodone-acetaminophen (NORCO) 5-325 MG tablet 1 tablet, Oral, Every 6 hours PRN   metFORMIN (GLUCOPHAGE) 1,000 mg, Oral, 2 times daily with meals    ROS - all of the below systems have been reviewed with the patient and positives are indicated with bold text General: chills, fever or night sweats Eyes: blurry vision or double vision ENT: epistaxis or sore throat Allergy/Immunology: itchy/watery eyes or nasal congestion Hematologic/Lymphatic: bleeding problems, blood clots or swollen lymph nodes Endocrine: temperature intolerance or unexpected weight changes Breast: new or changing breast lumps or nipple discharge Resp: cough, shortness of breath, or wheezing CV: chest pain or dyspnea on exertion GI: as per HPI GU: dysuria, trouble voiding, or hematuria MSK: joint pain or joint stiffness Neuro: TIA or stroke symptoms Derm: pruritus and skin lesion changes Psych: anxiety and depression  Objective   PE Blood pressure (!) 118/55, pulse 66, temperature 97.8 F (36.6 C), temperature source Oral, resp. rate 18, SpO2 100 %. Constitutional: NAD; conversant; no deformities Eyes: Moist conjunctiva; no lid lag; anicteric; PERRL Neck: Trachea midline; no  thyromegaly Lungs: Normal respiratory effort; no tactile fremitus CV: RRR; no palpable thrills; no pitting edema GI: Abd soft, nontender, nondistended; no palpable hepatosplenomegaly MSK: Normal range of motion of extremities; no clubbing/cyanosis Psychiatric: Appropriate affect; alert and oriented x3 Lymphatic: No palpable cervical  or axillary lymphadenopathy  Results for orders placed or performed during the hospital encounter of 07/13/22 (from the past 24 hour(s))  Lipase, blood     Status: Abnormal   Collection Time: 07/13/22  3:51 PM  Result Value Ref Range   Lipase 188 (H) 11 - 51 U/L  Comprehensive metabolic panel     Status: Abnormal   Collection Time: 07/13/22  3:51 PM  Result Value Ref Range   Sodium 131 (L) 135 - 145 mmol/L   Potassium 5.1 3.5 - 5.1 mmol/L   Chloride 104 98 - 111 mmol/L   CO2 17 (L) 22 - 32 mmol/L   Glucose, Bld 108 (H) 70 - 99 mg/dL   BUN 32 (H) 8 - 23 mg/dL   Creatinine, Ser 0.24 (H) 0.44 - 1.00 mg/dL   Calcium 8.9 8.9 - 09.7 mg/dL   Total Protein 7.8 6.5 - 8.1 g/dL   Albumin 3.9 3.5 - 5.0 g/dL   AST 18 15 - 41 U/L   ALT 10 0 - 44 U/L   Alkaline Phosphatase 64 38 - 126 U/L   Total Bilirubin 0.2 (L) 0.3 - 1.2 mg/dL   GFR, Estimated 49 (L) >60 mL/min   Anion gap 10 5 - 15  CBC     Status: Abnormal   Collection Time: 07/13/22  3:51 PM  Result Value Ref Range   WBC 12.0 (H) 4.0 - 10.5 K/uL   RBC 3.23 (L) 3.87 - 5.11 MIL/uL   Hemoglobin 8.6 (L) 12.0 - 15.0 g/dL   HCT 35.3 (L) 29.9 - 24.2 %   MCV 82.7 80.0 - 100.0 fL   MCH 26.6 26.0 - 34.0 pg   MCHC 32.2 30.0 - 36.0 g/dL   RDW 68.3 41.9 - 62.2 %   Platelets 474 (H) 150 - 400 K/uL   nRBC 0.0 0.0 - 0.2 %  Lactic acid, plasma     Status: Abnormal   Collection Time: 07/13/22  5:28 PM  Result Value Ref Range   Lactic Acid, Venous 2.9 (HH) 0.5 - 1.9 mmol/L    Imaging Orders  No imaging studies ordered today     Assessment and Plan   Rachel Nelson is an 71 y.o. female with appendicitis.  She has been on antibiotics for about a week now, and is feeling better.  We discussed the options to treat appendicitis including antibiotics and surgery.  Based on her CT scan, it appears she is a candidate to be treated with antibiotics.  It also is reassuring that her pain is complete resolved over the last couple days while she has been on  antibiotics at home.  I recommend she complete a 14-day course of antibiotics.  She has never had a colonoscopy before, and needs to have 1 of these in the next few months to follow-up and ensure there is no other pathology to explain her CT findings.  If she has recurrent symptoms and pain, we may consider proceeding with a laparoscopic appendectomy.  She can follow-up in my office as needed.    ICD-10-CM   1. Other appendicitis  K36        Quentin Ore, MD  [facility] Use [email or web address] to contact on call provider  New Patient Billing: [phone] - High MDM

## 2022-07-13 NOTE — ED Provider Triage Note (Signed)
Emergency Medicine Provider Triage Evaluation Note  Rachel Nelson , a 71 y.o. female  was evaluated in triage.  Patient had some abdominal pain over the last couple of days and went to PCP who scheduled an outpatient CT scan. they did the scan today and it showed an appendicitis.  Patient is asymptomatic  Review of Systems  Positive:  Negative: Abdominal pain, nausea, vomiting, fever, diarrhea  Physical Exam  There were no vitals taken for this visit. Gen:   Awake, no distress   Resp:  Normal effort  MSK:   Moves extremities without difficulty  Other:  Soft and nontender abdomen.  No periumbilical or right lower quadrant tenderness.  Ambulatory and well-appearing  Medical Decision Making  Medically screening exam initiated at 1:46 PM.  Appropriate orders placed.  Ocean Dohrmann was informed that the remainder of the evaluation will be completed by another provider, this initial triage assessment does not replace that evaluation, and the importance of remaining in the ED until their evaluation is complete.    CT does read for appendicitis.  Hemodynamically stable.  Lab work was not done today so I will order labs to complement this   Rhae Hammock, PA-C 07/13/22 1347

## 2022-07-13 NOTE — ED Provider Notes (Signed)
[facility] DEPT Provider Note   CSN: [id number] Arrival date & time: 07/13/22  1337     History  Chief Complaint  Patient presents with   Abnormal CT    Rachel Nelson is a 71 y.o. female.  HPI Last week patient was having abdominal pain.  She had lower abdominal pain without vomiting or fever.  If she followed up with her PCP and was diagnosed with a UTI.  She reports she took antibiotics but does not recall the name.  Her symptoms resolved.  She was scheduled to get a CT scan last week but was unable to thus proceeded with her ordered scan today.  They radiology interpretation is positive for appendicitis.  Patient reports at this point she is pain-free.  She has been to usual activities such as cooking and household activities without pain.    Home Medications Prior to Admission medications   Medication Sig Start Date End Date Taking? Authorizing Provider  amoxicillin-clavulanate (AUGMENTIN) 875-125 MG tablet Take 1 tablet by mouth every 12 (twelve) hours. 07/13/22  Yes Charlesetta Shanks, MD  acetaminophen (TYLENOL) 325 MG tablet Take 325 mg by mouth every 6 (six) hours as needed for mild pain or headache.    [provider]  aspirin EC 81 MG tablet Take 81 mg by mouth daily.    [provider]  atorvastatin (LIPITOR) 20 MG tablet Take 20 mg by mouth daily.  07/25/19   [provider]  HYDROcodone-acetaminophen (NORCO) 5-325 MG tablet Take 1 tablet by mouth every 6 (six) hours as needed for moderate pain. 10/30/19   McKenzie, Candee Furbish, MD  metFORMIN (GLUCOPHAGE) 1000 MG tablet Take 1 tablet (1,000 mg total) by mouth 2 (two) times daily with a meal. 09/20/19 09/19/20  Shelly Coss, MD      Allergies    Patient has no known allergies.    Review of Systems   Review of Systems  Physical Exam Updated Vital Signs BP 136/74   Pulse 73   Temp 97.8 F (36.6 C) (Oral)   Resp 18   SpO2 98%  Physical Exam Constitutional:       Comments: Well-nourished well-developed.  Excellent physical condition.  No respiratory distress.  HENT:     Head: Normocephalic and atraumatic.     Mouth/Throat:     Pharynx: Oropharynx is clear.  Eyes:     Extraocular Movements: Extraocular movements intact.  Cardiovascular:     Rate and Rhythm: Normal rate and regular rhythm.  Pulmonary:     Effort: Pulmonary effort is normal.     Breath sounds: Normal breath sounds.  Abdominal:     General: There is no distension.     Palpations: Abdomen is soft.     Tenderness: There is no abdominal tenderness. There is no guarding.  Musculoskeletal:        General: No swelling. Normal range of motion.     Right lower leg: No edema.     Left lower leg: No edema.  Skin:    General: Skin is warm and dry.  Neurological:     General: No focal deficit present.     Mental Status: She is oriented to person, place, and time.     Motor: No weakness.     Coordination: Coordination normal.  Psychiatric:        Mood and Affect: Mood normal.     ED Results / Procedures / Treatments   Labs (all labs ordered are listed, but only  abnormal results are displayed) Labs Reviewed  LIPASE, BLOOD - Abnormal; Notable for the following components:      Result Value   Lipase 188 (*)    All other components within normal limits  COMPREHENSIVE METABOLIC PANEL - Abnormal; Notable for the following components:   Sodium 131 (*)    CO2 17 (*)    Glucose, Bld 108 (*)    BUN 32 (*)    Creatinine, Ser 1.18 (*)    Total Bilirubin 0.2 (*)    GFR, Estimated 49 (*)    All other components within normal limits  CBC - Abnormal; Notable for the following components:   WBC 12.0 (*)    RBC 3.23 (*)    Hemoglobin 8.6 (*)    HCT 26.7 (*)    Platelets 474 (*)    All other components within normal limits  LACTIC ACID, PLASMA - Abnormal; Notable for the following components:   Lactic Acid, Venous 2.9 (*)    All other components within normal limits  LACTIC ACID,  PLASMA - Abnormal; Notable for the following components:   Lactic Acid, Venous 2.2 (*)    All other components within normal limits  URINALYSIS, ROUTINE W REFLEX MICROSCOPIC    EKG None  Radiology CT ABDOMEN PELVIS WO CONTRAST  Result Date: 07/13/2022 CLINICAL DATA:  Right lower quadrant abdominal pain for 4 days. Elevated lipase and amylase levels. History of kidney stones. EXAM: CT ABDOMEN AND PELVIS WITHOUT CONTRAST TECHNIQUE: Multidetector CT imaging of the abdomen and pelvis was performed following the standard protocol without IV contrast. RADIATION DOSE REDUCTION: This exam was performed according to the departmental dose-optimization program which includes automated exposure control, adjustment of the mA and/or kV according to patient size and/or use of iterative reconstruction technique. COMPARISON:  10/12/2019 FINDINGS: Lower chest: Mild breathing motion artifact. Patchy atelectasis but no pleural effusions or pulmonary lesions. The heart is normal in size. No pericardial effusion. Hepatobiliary: No hepatic lesions are identified without contrast. The gallbladder is mildly contracted. No common bile duct dilatation. Pancreas: No mass, inflammation or ductal dilatation. Spleen: Normal size. No focal lesions. Adrenals/Urinary Tract: Adrenal glands are unremarkable. Small right renal calculi. No obstructing ureteral calculi. No worrisome renal lesions without contrast. The bladder is unremarkable. Stomach/Bowel: The stomach, duodenum, small bowel and colon are grossly normal. The retrocecal appendix is thickened measuring up to 9 mm in diameter and there is periappendiceal inflammatory change. Findings consistent with appendicitis. Vascular/Lymphatic: Scattered atherosclerotic calcifications but no aneurysm. Small scattered mesenteric and retroperitoneal nodes but no mass or overt adenopathy. Reproductive: The uterus and ovaries are unremarkable. Other: No free pelvic fluid collections.  Musculoskeletal: No significant bony findings. IMPRESSION: 1. CT findings consistent with appendicitis. 2. Small right renal calculi but no obstructing ureteral calculi or bladder calculi. These results will be called to the ordering clinician or representative by the Radiologist Assistant, and communication documented in the PACS or [employer]. Electronically Signed   By: Marijo Sanes M.D.   On: 07/13/2022 12:08    Procedures Procedures    Medications Ordered in ED Medications  lactated ringers infusion (0 mLs Intravenous Stopped 07/13/22 1934)  cefTRIAXone (ROCEPHIN) 2 g in sodium chloride 0.9 % 100 mL IVPB (0 g Intravenous Stopped 07/13/22 1751)    And  metroNIDAZOLE (FLAGYL) IVPB 500 mg (0 mg Intravenous Stopped 07/13/22 1842)    ED Course/ Medical Decision Making/ A&P  Medical Decision Making Amount and/or Complexity of Data Reviewed Labs: ordered.  Risk Prescription drug management.   Patient had abdominal pain last week.  As of yesterday symptoms were significantly improved.  She followed up with outpatient CT scanning as ordered last week.  CT result is positive for appendicitis.  Patient is nontoxic and nontender.  She does have leukocytosis.  Will proceed with IV fluid, antibiotics for mild appendicitis and general surgery consult.  16: 42 reviewed with Dr. Thermon Leyland.  General surgery will evaluate at bedside  Dr. Tilden Dome recommends patient antibiotics Augmentin for 10 days.  We have completed fluid resuscitation with a liter of normal saline, Rocephin and Flagyl IV.  I have reassessed the patient.  She is alert in no distress.  Vital signs are stable and she is pain-free.  We will proceed with discharge on Augmentin.  Discharge instructions include careful return precautions and follow-up planning.  Reviewed this with the patient and her son at bedside.  They voiced understanding.        Final Clinical Impression(s) / ED  Diagnoses Final diagnoses:  Other appendicitis    Rx / DC Orders ED Discharge Orders          Ordered    amoxicillin-clavulanate (AUGMENTIN) 875-125 MG tablet  Every 12 hours        07/13/22 2037              Charlesetta Shanks, MD 07/13/22 2049

## 2022-07-13 NOTE — ED Triage Notes (Addendum)
Patient had a CT scan done today and it showed an inflamed appendix. Patient is not having abdominal pain today but she was last week on the right lower side. No vomiting. Patient did not take her BP medication today.

## 2022-09-28 DIAGNOSIS — E782 Mixed hyperlipidemia: Secondary | ICD-10-CM | POA: Diagnosis not present

## 2022-09-28 DIAGNOSIS — E1165 Type 2 diabetes mellitus with hyperglycemia: Secondary | ICD-10-CM | POA: Diagnosis not present

## 2022-10-06 DIAGNOSIS — N1831 Chronic kidney disease, stage 3a: Secondary | ICD-10-CM | POA: Diagnosis not present

## 2022-10-06 DIAGNOSIS — I10 Essential (primary) hypertension: Secondary | ICD-10-CM | POA: Diagnosis not present

## 2022-10-06 DIAGNOSIS — E1165 Type 2 diabetes mellitus with hyperglycemia: Secondary | ICD-10-CM | POA: Diagnosis not present

## 2022-10-06 DIAGNOSIS — E781 Pure hyperglyceridemia: Secondary | ICD-10-CM | POA: Diagnosis not present

## 2022-10-06 DIAGNOSIS — Z23 Encounter for immunization: Secondary | ICD-10-CM | POA: Diagnosis not present

## 2022-10-06 DIAGNOSIS — D649 Anemia, unspecified: Secondary | ICD-10-CM | POA: Diagnosis not present

## 2022-10-06 DIAGNOSIS — E782 Mixed hyperlipidemia: Secondary | ICD-10-CM | POA: Diagnosis not present

## 2023-04-28 DIAGNOSIS — I10 Essential (primary) hypertension: Secondary | ICD-10-CM | POA: Diagnosis not present

## 2023-04-28 DIAGNOSIS — N1831 Chronic kidney disease, stage 3a: Secondary | ICD-10-CM | POA: Diagnosis not present

## 2023-04-28 DIAGNOSIS — Z0000 Encounter for general adult medical examination without abnormal findings: Secondary | ICD-10-CM | POA: Diagnosis not present

## 2023-04-28 DIAGNOSIS — E782 Mixed hyperlipidemia: Secondary | ICD-10-CM | POA: Diagnosis not present

## 2023-04-28 DIAGNOSIS — D649 Anemia, unspecified: Secondary | ICD-10-CM | POA: Diagnosis not present

## 2023-04-28 DIAGNOSIS — E1165 Type 2 diabetes mellitus with hyperglycemia: Secondary | ICD-10-CM | POA: Diagnosis not present

## 2023-04-29 DIAGNOSIS — E782 Mixed hyperlipidemia: Secondary | ICD-10-CM | POA: Diagnosis not present

## 2023-04-29 DIAGNOSIS — I10 Essential (primary) hypertension: Secondary | ICD-10-CM | POA: Diagnosis not present

## 2023-04-29 DIAGNOSIS — Z23 Encounter for immunization: Secondary | ICD-10-CM | POA: Diagnosis not present

## 2023-04-29 DIAGNOSIS — E1165 Type 2 diabetes mellitus with hyperglycemia: Secondary | ICD-10-CM | POA: Diagnosis not present

## 2023-04-29 DIAGNOSIS — Z0000 Encounter for general adult medical examination without abnormal findings: Secondary | ICD-10-CM | POA: Diagnosis not present

## 2023-04-29 DIAGNOSIS — N1831 Chronic kidney disease, stage 3a: Secondary | ICD-10-CM | POA: Diagnosis not present

## 2023-04-29 DIAGNOSIS — D508 Other iron deficiency anemias: Secondary | ICD-10-CM | POA: Diagnosis not present
